# Patient Record
Sex: Female | Born: 1950 | Race: White | Hispanic: No | Marital: Married | Smoking: Former smoker
Health system: Southern US, Community
[De-identification: ages and names within clinical notes are randomized; demographics above are authoritative.]

## PROBLEM LIST (undated history)

## (undated) DIAGNOSIS — D320 Benign neoplasm of cerebral meninges: Secondary | ICD-10-CM

## (undated) DIAGNOSIS — J449 Chronic obstructive pulmonary disease, unspecified: Secondary | ICD-10-CM

## (undated) DIAGNOSIS — E7800 Pure hypercholesterolemia, unspecified: Secondary | ICD-10-CM

## (undated) DIAGNOSIS — E039 Hypothyroidism, unspecified: Secondary | ICD-10-CM

## (undated) DIAGNOSIS — I253 Aneurysm of heart: Secondary | ICD-10-CM

## (undated) DIAGNOSIS — I10 Essential (primary) hypertension: Secondary | ICD-10-CM

## (undated) DIAGNOSIS — I2510 Atherosclerotic heart disease of native coronary artery without angina pectoris: Secondary | ICD-10-CM

## (undated) DIAGNOSIS — E042 Nontoxic multinodular goiter: Secondary | ICD-10-CM

## (undated) DIAGNOSIS — M810 Age-related osteoporosis without current pathological fracture: Secondary | ICD-10-CM

## (undated) DIAGNOSIS — M1990 Unspecified osteoarthritis, unspecified site: Secondary | ICD-10-CM

## (undated) HISTORY — PX: TONSILLECTOMY: SUR1361

## (undated) HISTORY — DX: Unspecified osteoarthritis, unspecified site: M19.90

## (undated) HISTORY — DX: Hypothyroidism, unspecified: E03.9

## (undated) HISTORY — DX: Chronic obstructive pulmonary disease, unspecified: J44.9

## (undated) HISTORY — DX: Age-related osteoporosis without current pathological fracture: M81.0

## (undated) SURGERY — FIXATION, FEMUR, NECK, PERCUTANEOUS, USING SCREW
Anesthesia: Choice | Laterality: Right

---

## 1998-12-23 ENCOUNTER — Emergency Department (HOSPITAL_COMMUNITY): Admission: EM | Admit: 1998-12-23 | Discharge: 1998-12-23 | Payer: Self-pay | Admitting: Emergency Medicine

## 1998-12-24 ENCOUNTER — Encounter: Payer: Self-pay | Admitting: Emergency Medicine

## 1999-08-30 ENCOUNTER — Other Ambulatory Visit: Admission: RE | Admit: 1999-08-30 | Discharge: 1999-08-30 | Payer: Self-pay | Admitting: Obstetrics and Gynecology

## 1999-11-23 ENCOUNTER — Encounter: Payer: Self-pay | Admitting: Obstetrics and Gynecology

## 1999-11-23 ENCOUNTER — Ambulatory Visit (HOSPITAL_COMMUNITY): Admission: RE | Admit: 1999-11-23 | Discharge: 1999-11-23 | Payer: Self-pay | Admitting: Obstetrics and Gynecology

## 2000-09-12 ENCOUNTER — Other Ambulatory Visit: Admission: RE | Admit: 2000-09-12 | Discharge: 2000-09-12 | Payer: Self-pay | Admitting: Obstetrics and Gynecology

## 2000-12-18 ENCOUNTER — Encounter: Payer: Self-pay | Admitting: Obstetrics and Gynecology

## 2000-12-18 ENCOUNTER — Ambulatory Visit (HOSPITAL_COMMUNITY): Admission: RE | Admit: 2000-12-18 | Discharge: 2000-12-18 | Payer: Self-pay | Admitting: Obstetrics and Gynecology

## 2002-01-26 ENCOUNTER — Encounter: Payer: Self-pay | Admitting: *Deleted

## 2002-01-26 ENCOUNTER — Emergency Department (HOSPITAL_COMMUNITY): Admission: EM | Admit: 2002-01-26 | Discharge: 2002-01-26 | Payer: Self-pay | Admitting: *Deleted

## 2002-07-25 ENCOUNTER — Inpatient Hospital Stay (HOSPITAL_COMMUNITY): Admission: AD | Admit: 2002-07-25 | Discharge: 2002-07-25 | Payer: Self-pay | Admitting: Obstetrics and Gynecology

## 2003-08-16 ENCOUNTER — Emergency Department (HOSPITAL_COMMUNITY): Admission: EM | Admit: 2003-08-16 | Discharge: 2003-08-16 | Payer: Self-pay | Admitting: Emergency Medicine

## 2004-03-15 ENCOUNTER — Other Ambulatory Visit: Admission: RE | Admit: 2004-03-15 | Discharge: 2004-03-15 | Payer: Self-pay | Admitting: Obstetrics and Gynecology

## 2004-06-15 ENCOUNTER — Ambulatory Visit: Payer: Self-pay | Admitting: Cardiology

## 2005-07-11 ENCOUNTER — Other Ambulatory Visit: Admission: RE | Admit: 2005-07-11 | Discharge: 2005-07-11 | Payer: Self-pay | Admitting: Obstetrics and Gynecology

## 2009-05-03 ENCOUNTER — Ambulatory Visit (HOSPITAL_COMMUNITY): Admission: RE | Admit: 2009-05-03 | Discharge: 2009-05-03 | Payer: Self-pay | Admitting: Orthopedic Surgery

## 2009-06-09 ENCOUNTER — Encounter (HOSPITAL_COMMUNITY): Admission: RE | Admit: 2009-06-09 | Discharge: 2009-07-09 | Payer: Self-pay | Admitting: Internal Medicine

## 2009-07-07 ENCOUNTER — Other Ambulatory Visit: Admission: RE | Admit: 2009-07-07 | Discharge: 2009-07-07 | Payer: Self-pay | Admitting: Interventional Radiology

## 2009-07-07 ENCOUNTER — Encounter: Admission: RE | Admit: 2009-07-07 | Discharge: 2009-07-07 | Payer: Self-pay | Admitting: Endocrinology

## 2010-03-22 ENCOUNTER — Encounter: Admission: RE | Admit: 2010-03-22 | Discharge: 2010-03-22 | Payer: Self-pay | Admitting: Endocrinology

## 2010-05-10 ENCOUNTER — Encounter: Payer: Self-pay | Admitting: Cardiology

## 2010-05-31 ENCOUNTER — Encounter: Payer: Self-pay | Admitting: Cardiology

## 2010-06-02 ENCOUNTER — Ambulatory Visit: Payer: Self-pay | Admitting: Cardiology

## 2010-06-21 DIAGNOSIS — E785 Hyperlipidemia, unspecified: Secondary | ICD-10-CM | POA: Insufficient documentation

## 2010-06-21 DIAGNOSIS — R0789 Other chest pain: Secondary | ICD-10-CM | POA: Insufficient documentation

## 2010-06-21 DIAGNOSIS — E042 Nontoxic multinodular goiter: Secondary | ICD-10-CM | POA: Insufficient documentation

## 2010-06-21 DIAGNOSIS — R002 Palpitations: Secondary | ICD-10-CM | POA: Insufficient documentation

## 2010-07-03 ENCOUNTER — Encounter: Payer: Self-pay | Admitting: Endocrinology

## 2010-07-03 ENCOUNTER — Encounter: Payer: Self-pay | Admitting: Orthopedic Surgery

## 2010-07-05 ENCOUNTER — Encounter: Payer: Self-pay | Admitting: Cardiology

## 2010-07-05 DIAGNOSIS — I10 Essential (primary) hypertension: Secondary | ICD-10-CM | POA: Insufficient documentation

## 2010-07-05 DIAGNOSIS — F17200 Nicotine dependence, unspecified, uncomplicated: Secondary | ICD-10-CM | POA: Insufficient documentation

## 2010-07-14 NOTE — Assessment & Plan Note (Signed)
Summary: ec6/ irregular heart beat. pt has uch./gd   Visit Type:  Initial Consult Primary Provider:  Dr. Phillips Odor  CC:  pt has no complaints today.  History of Present Illness: Penny Hicks comes in today for evaluation of an irregular heartbeat.  She's referred by Dr. Vincente Poli.   Looking at the notes her heart rate was not recorded in her cardiac exam revealed a regular rate and rhythm. There was no EKG. She did have a son who recently died of sudden cardiac death from an enlarged heart and that may precipitated his visit as well.  She denies new palpitations, angina, or chest discomfort. 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  She has a history of hypertension. She eats a lot of sodium at work. She is on lisinopril HCTZ started by her primary care Dr. Phillips Odor.  She smokes and would like to stop. Because of increased stress of losing her son she been smoking more than usual. She does not exercise on a regular basis.  Recent blood work in November showed a normal thyroid panel, total cholesterol 213, HDL 57, total cholesterol ratio ratio 3.7, LDL 1:30. Her hemoglobin A1c was 5.9%.  Her EKG today shows normal sinus rhythm in the 90s.    Current Medications (verified): 1)  Ambien 10 Mg Tabs (Zolpidem Tartrate) .Marland Kitchen.. 1 Tab At Bedtime 2)  Tramadol Hcl 50 Mg Tabs (Tramadol Hcl) .Marland Kitchen.. 1 Tab Two Times A Day 3)  Lisinopril-Hydrochlorothiazide 10-12.5 Mg Tabs (Lisinopril-Hydrochlorothiazide) .Marland Kitchen.. 1 Tab Once Daily 4)  Synthroid 75 Mcg Tabs (Levothyroxine Sodium) .Marland Kitchen.. 1 Tab Once Daily 5)  Vagifem 10 Mcg Tabs (Estradiol) .... 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 Neck:  Neck supple, no JVD. No masses, thyromegaly or abnormal cervical nodes. Chest Darcus Edds:  no deformities or breast masses noted Lungs:  Clear bilaterally to auscultation and percussion. Heart:  PMI nondisplaced, normal S1-S2, no click or murmur. Regular rate and rhythm, no carotid bruits Abdomen:  soft, good bowel sounds, no bruits Msk:  Back normal, normal gait. Muscle strength and tone normal. Pulses:  pulses normal in all 4 extremities Extremities:  No clubbing or cyanosis. Neurologic:  Alert and oriented x 3. Skin:  Intact without lesions or rashes. Psych:  anxious and hyperactive.     Problems:  Medical Problems Added: 1)  Dx of Tobacco Abuse  (ICD-305.1) 2)  Dx of Hypertension, Unspecified  (ICD-401.9)  Impression & Recommendations:  Problem # 1:  HYPERTENSION, UNSPECIFIED (ICD-401.9) Assessment Deteriorated  I suspect this is due to 2 a high sodium diet, stress, smoking, and genetic. Have increased her lisinopril to 20 mg and her HCTZ at  12.5. We'll have her return in 2 weeks for blood pressure check and metabolic profile. We have given her information for no smoking clinic. In addition we have talked about salt restriction and beginning to walk on a regular basis. Her updated medication list for this problem includes:    Lisinopril-hydrochlorothiazide 20-12.5 Mg Tabs (Lisinopril-hydrochlorothiazide) .Marland Kitchen... Take 1 tablet daily  Orders: Misc. Referral (Misc. Ref)  Problem # 2:  HYPERLIPIDEMIA (ICD-272.4) if That she continues to  smoke, I would probably put her on a statin low-dose to lower her LDL below 100. She is blessed with a relatively good HDL level. I will leave this to her primary care.  Problem # 3:  TOBACCO ABUSE (ICD-305.1)  she That will also tender no smoking clinic. Information given.  Orders: Misc. Referral (Misc. Ref)  Other Orders: EKG w/ Interpretation (93000)  Patient Instructions: 1)  Your physician recommends that you return for lab work 07/19/10 for a bmet and a nurse room visit for a blood pressure check at:  9:00am 2)  Your physician has recommended you make the following change in your medication:  3)  Your physician has requested that you limit the intake of sodium (salt) in your diet to two grams daily. Please see [facility] handout. 4)  Your physician discussed the hazards of tobacco use.  Tobacco use cessation is recommended and techniques and options to help you quit were discussed. Prescriptions: LISINOPRIL-HYDROCHLOROTHIAZIDE 20-12.5 MG TABS (LISINOPRIL-HYDROCHLOROTHIAZIDE) Take 1 tablet daily  #30 x 11   Entered by:   Lisabeth Devoid RN   Authorized by:   Gaylord Shih, MD, [facility]   Signed by:   Lisabeth Devoid RN on 07/05/2010   Method used:   Electronically to        [facility]  [address] [address] (retail)       [address]       [place], [place]  [address]       Ph: [phone] or [phone]       Fax: [phone]   RxID:   [id number]

## 2010-07-19 ENCOUNTER — Encounter (INDEPENDENT_AMBULATORY_CARE_PROVIDER_SITE_OTHER): Payer: 59

## 2010-07-19 ENCOUNTER — Encounter: Payer: Self-pay | Admitting: Cardiovascular Disease

## 2010-07-19 ENCOUNTER — Other Ambulatory Visit (INDEPENDENT_AMBULATORY_CARE_PROVIDER_SITE_OTHER): Payer: 59

## 2010-07-19 ENCOUNTER — Other Ambulatory Visit: Payer: Self-pay | Admitting: Cardiovascular Disease

## 2010-07-19 ENCOUNTER — Encounter (INDEPENDENT_AMBULATORY_CARE_PROVIDER_SITE_OTHER): Payer: Self-pay | Admitting: *Deleted

## 2010-07-19 DIAGNOSIS — I10 Essential (primary) hypertension: Secondary | ICD-10-CM

## 2010-07-19 LAB — BASIC METABOLIC PANEL
Chloride: 101 mEq/L (ref 96–112)
Creatinine, Ser: 0.8 mg/dL (ref 0.4–1.2)
GFR: 76.38 mL/min (ref >60.00)
Potassium: 4.2 mEq/L (ref 3.5–5.1)
Sodium: 139 mEq/L (ref 135–145)

## 2010-07-28 NOTE — Assessment & Plan Note (Signed)
  Nurse Visit   Vital Signs:  Patient profile:   60 year old female Pulse rate:   64 / minute BP supine:   120 / 80  (right arm) BP sitting:   120 / 80  (left arm) Cuff size:   regular  Primary Provider:  Dr. Phillips Odor   History of Present Illness: Patient here for BP check in follow up to the increase dose of Lisinopril 20/Hctz12.5 on 07/05/2010. She states that she is tolerating the medication without any problems. Bmet done today. Advised patient that she will be called with lab work results when available after MD review.    Allergies: No Known Drug Allergies

## 2010-08-23 NOTE — Letter (Signed)
Summary: [facility] Office Visit Note   [facility] Office Visit Note   Imported By: Roderic Ovens 08/18/2010 15:36:06  _____________________________________________________________________  External Attachment:    Type:   Image     Comment:   External Document

## 2010-10-25 ENCOUNTER — Other Ambulatory Visit: Payer: Self-pay | Admitting: Endocrinology

## 2010-10-25 DIAGNOSIS — E041 Nontoxic single thyroid nodule: Secondary | ICD-10-CM

## 2010-10-28 NOTE — Procedures (Signed)
   NAME:  Penny Hicks, Penny Hicks                       ACCOUNT NO.:  [id number]   MEDICAL RECORD NO.:  [id number]                   PATIENT TYPE:  EMS   LOCATION:  ED                                   FACILITY:  [facility]   PHYSICIAN:  Fredirick Maudlin, M.D.              DATE OF BIRTH:  Nov 03, 1950   DATE OF PROCEDURE:  01/26/2002  DATE OF DISCHARGE:                                EKG INTERPRETATION   The rhythm was a sinus rhythm with a rate in the 80s.  There are small T  waves inferiorly.  These were also seen in the lateral chest leads.  Otherwise normal EKG.                                               Fredirick Maudlin, M.D.    [facility]/MEDQ  D:  01/27/2002  T:  01/28/2002  Job:  [id number]

## 2011-03-27 ENCOUNTER — Other Ambulatory Visit: Payer: 59

## 2011-04-17 ENCOUNTER — Other Ambulatory Visit: Payer: 59

## 2011-04-19 ENCOUNTER — Ambulatory Visit
Admission: RE | Admit: 2011-04-19 | Discharge: 2011-04-19 | Disposition: A | Discharge status: Home / Self Care | Payer: 59 | Source: Ambulatory Visit | Attending: Endocrinology | Admitting: Endocrinology

## 2011-04-19 DIAGNOSIS — E041 Nontoxic single thyroid nodule: Secondary | ICD-10-CM

## 2011-06-21 ENCOUNTER — Other Ambulatory Visit: Payer: Self-pay | Admitting: *Deleted

## 2011-06-21 MED ORDER — LISINOPRIL-HYDROCHLOROTHIAZIDE 20-12.5 MG PO TABS: 1.0000 | ORAL_TABLET | Freq: Every day | ORAL | Status: DC

## 2011-11-01 ENCOUNTER — Other Ambulatory Visit: Payer: Self-pay | Admitting: Obstetrics and Gynecology

## 2012-01-02 ENCOUNTER — Other Ambulatory Visit: Payer: Self-pay | Admitting: Cardiology

## 2012-01-03 ENCOUNTER — Telehealth: Payer: Self-pay | Admitting: *Deleted

## 2012-01-03 NOTE — Telephone Encounter (Signed)
Daughter returned call. Pt still asleep. Aware refill sent in. Pt will call back to schedule yearly appt with Dr. Daleen Squibb. Mylo Red RN

## 2012-01-03 NOTE — Telephone Encounter (Signed)
LMTCB. Pt overdue for yearly appointment. Refilled lisinopril-hctz. Mylo Red RN

## 2012-02-10 IMAGING — US US SOFT TISSUE HEAD/NECK
1 series · 14 of 25 positions shown · non-contrast
Comparison: 03/22/2010

CLINICAL DATA: Thyroid nodule, prior benign biopsy

THYROID ULTRASOUND
TECHNIQUE: Ultrasound examination of the thyroid gland and adjacent
soft tissues was performed.

[Series 1: us soft tissue head/neck · 0.08mm/px · 14 of 90 slices shown]
[im 1/90]
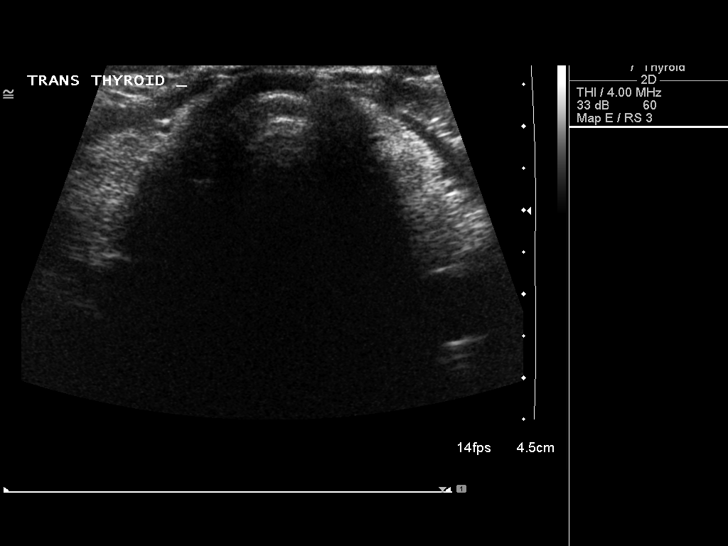
[im 8/90]
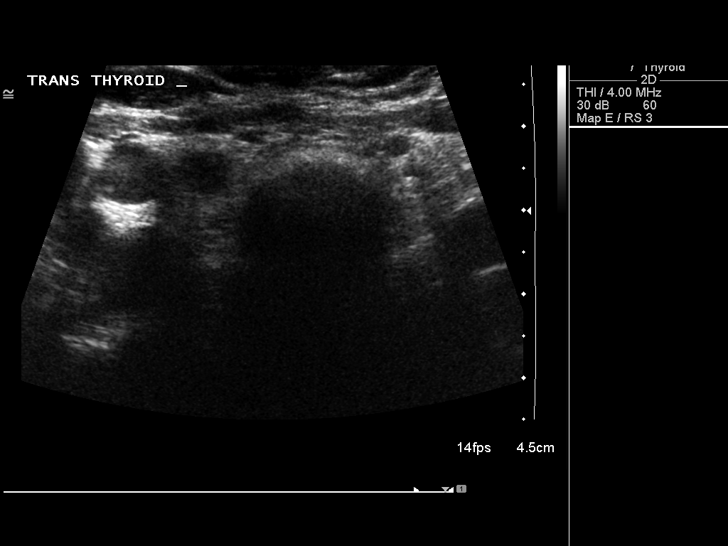
[im 15/90]
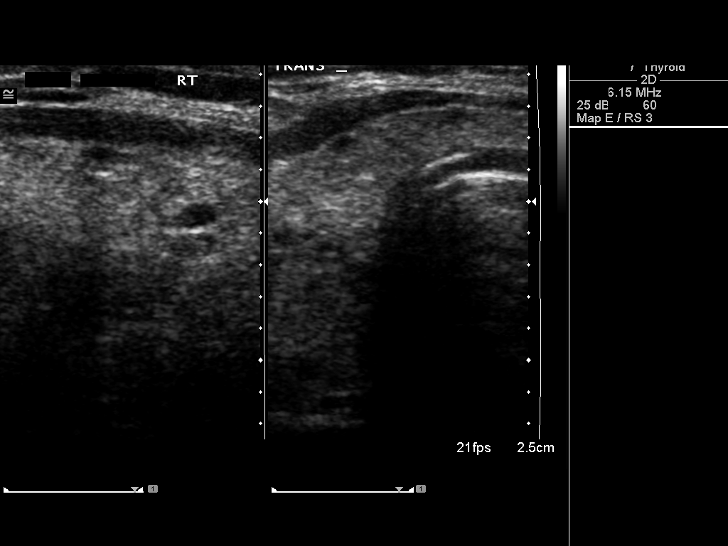
[im 23/90]
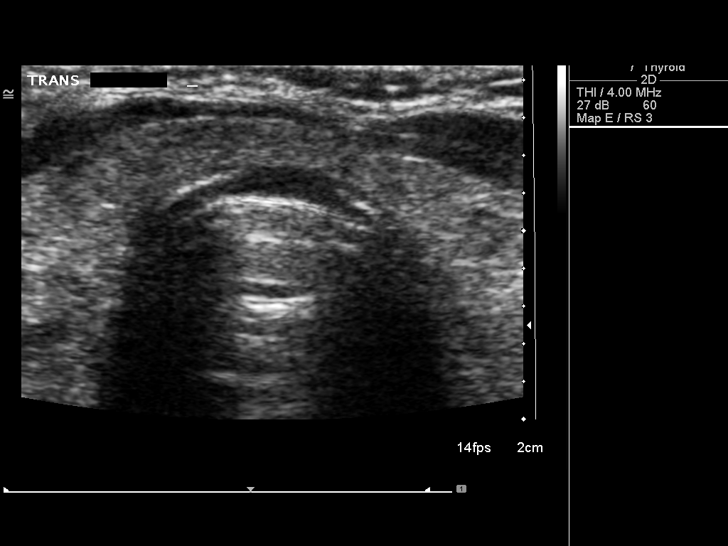
[im 30/90]
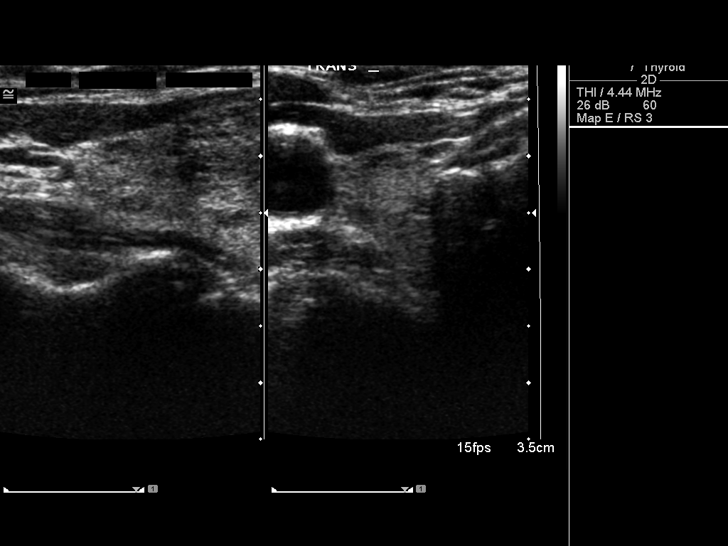
[im 34/90]
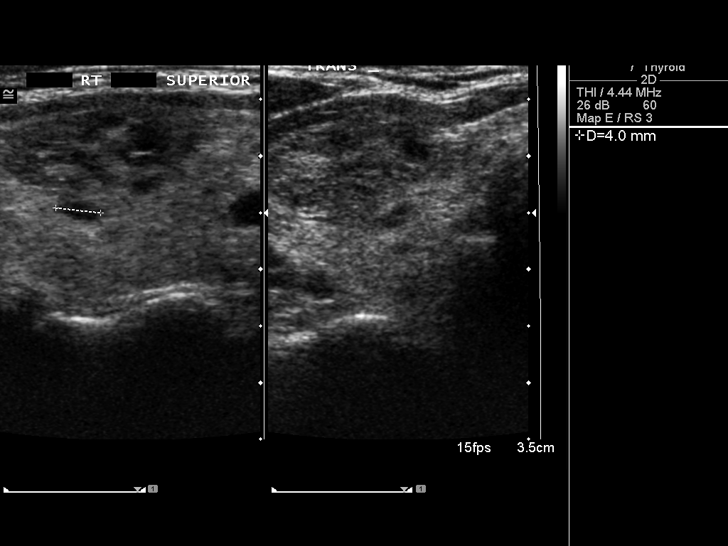
[im 41/90]
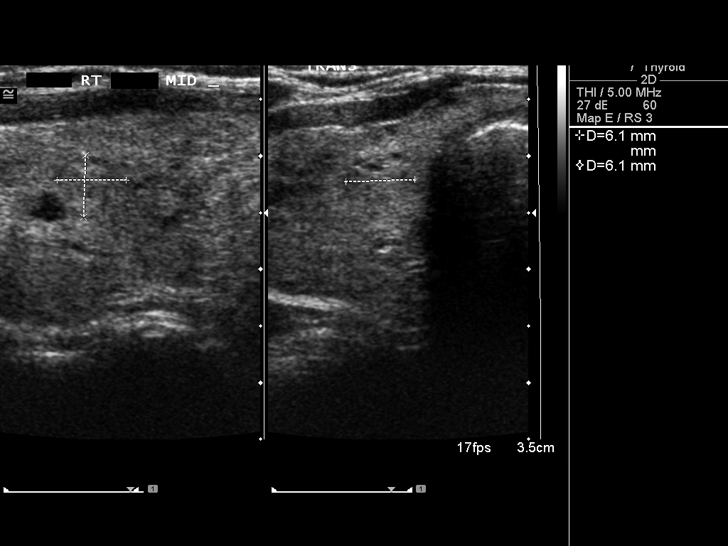
[im 49/90]
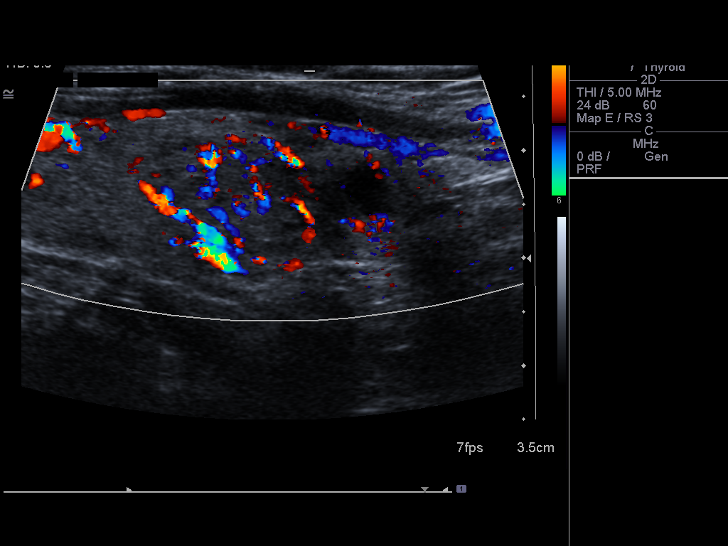
[im 56/90]
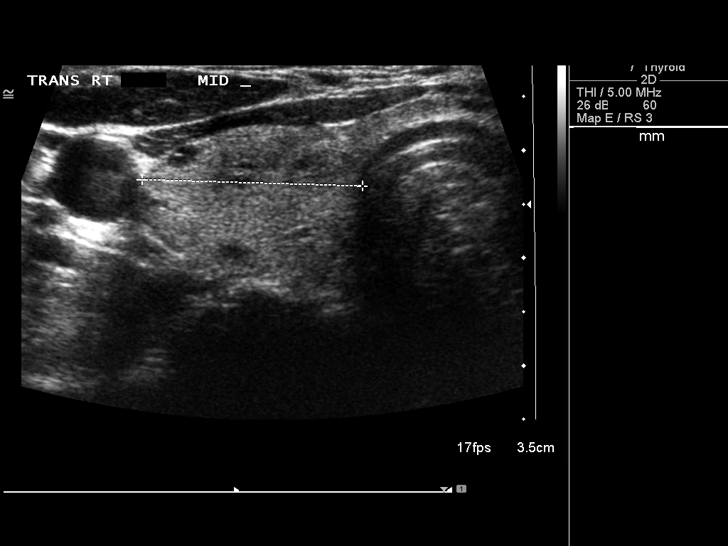
[im 60/90]
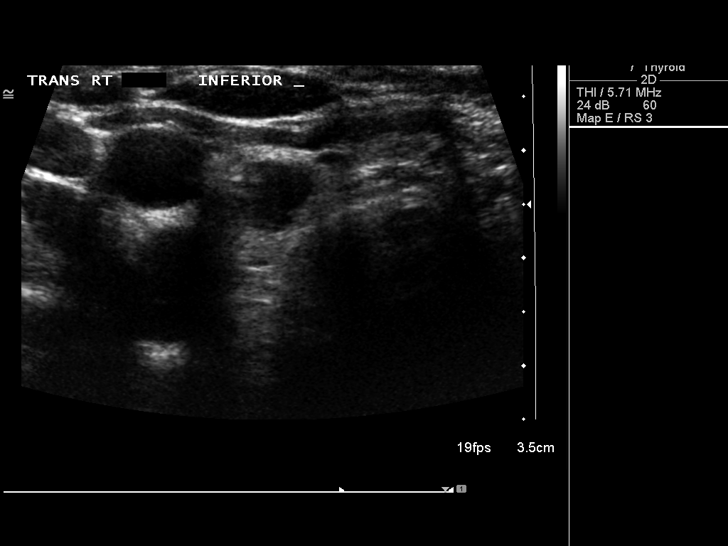
[im 67/90]
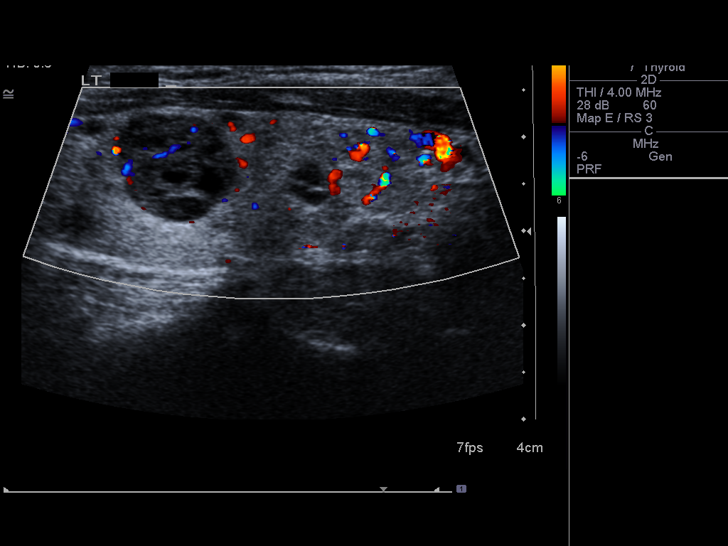
[im 75/90]
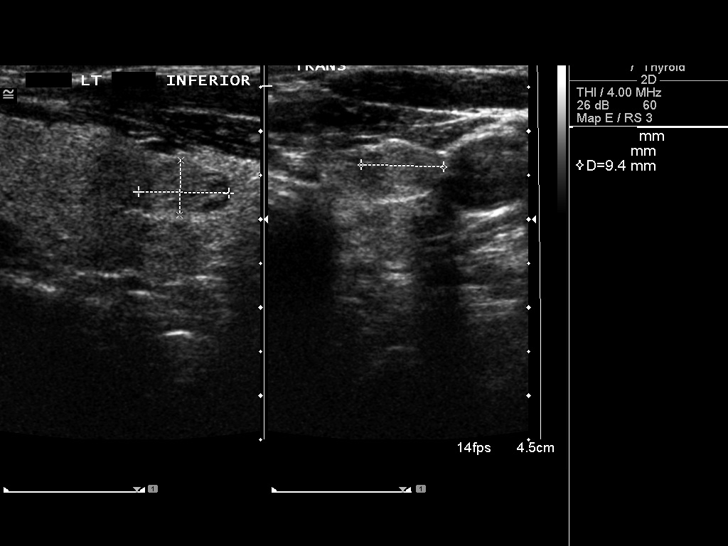
[im 82/90]
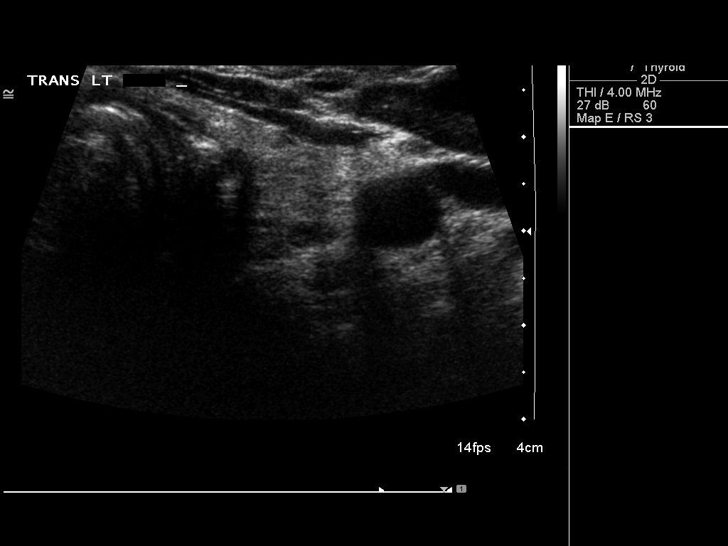
[im 90/90]
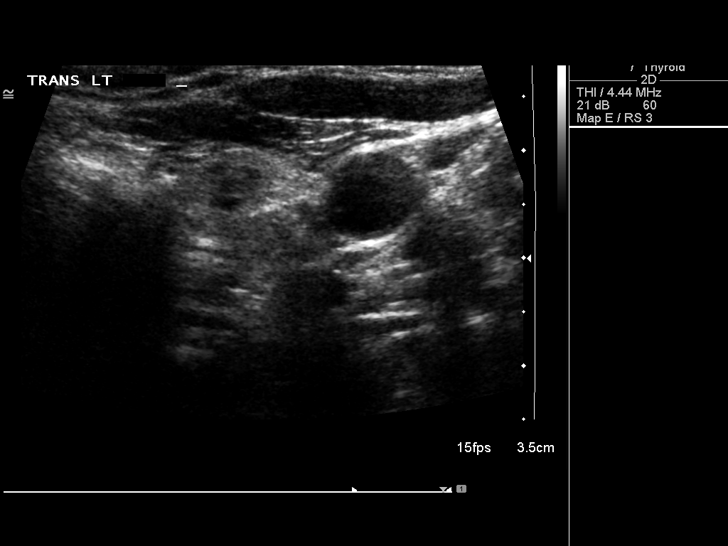

[14 of 25 positions shown; findings below may reference images not displayed]

FINDINGS: Right thyroid lobe:  Measures 5.6 x 1.7 x 2.1 cm.
Left thyroid lobe:  Measures 5.8 x 2.0 x 2.0 cm.
Isthmus:  Measures 2 mm in thickness.

Focal nodules:  Numerous bilateral thyroid nodules.  Dominant
nodules include:
--1.8 x 1.1 x 1.6 cm spongiform nodule or in the right upper gland
--2.0 x 1.3 x 1.3 cm predominantly solid nodule in the right lower
gland
--1.3 x 1.2 x 1.3 cm spongiform nodule in the left upper gland
--1.6 x 1.2 x 1.3 cm predominantly solid nodule in the left lower
gland

Overall, this appearance is grossly unchanged from the prior study.

Lymphadenopathy:  None visualized.
IMPRESSION: Numerous bilateral thyroid nodules, compatible with multinodular
goiter.  Dominant nodules as above, grossly unchanged.

## 2012-03-22 ENCOUNTER — Encounter: Payer: Self-pay | Admitting: Gastroenterology

## 2012-07-04 ENCOUNTER — Other Ambulatory Visit: Payer: Self-pay | Admitting: Cardiology

## 2012-07-05 MED ORDER — LISINOPRIL-HYDROCHLOROTHIAZIDE 20-12.5 MG PO TABS: 1.0000 | ORAL_TABLET | Freq: Every day | ORAL | Status: DC

## 2012-07-05 NOTE — Addendum Note (Signed)
Addended by: Reine Just on: 07/05/2012 09:23 AM   Modules accepted: Orders

## 2012-07-30 ENCOUNTER — Other Ambulatory Visit: Payer: Self-pay | Admitting: *Deleted

## 2012-07-30 MED ORDER — LISINOPRIL-HYDROCHLOROTHIAZIDE 20-12.5 MG PO TABS: 1.0000 | ORAL_TABLET | Freq: Every day | ORAL | Status: DC

## 2013-05-13 ENCOUNTER — Other Ambulatory Visit: Payer: Self-pay | Admitting: Obstetrics and Gynecology

## 2015-02-23 ENCOUNTER — Other Ambulatory Visit: Payer: Self-pay | Admitting: Obstetrics and Gynecology

## 2015-02-24 LAB — CYTOLOGY - PAP

## 2015-05-13 DIAGNOSIS — I5022 Chronic systolic (congestive) heart failure: Secondary | ICD-10-CM

## 2015-05-13 HISTORY — DX: Chronic systolic (congestive) heart failure: I50.22

## 2015-05-24 ENCOUNTER — Emergency Department (HOSPITAL_COMMUNITY): Payer: 59

## 2015-05-24 ENCOUNTER — Inpatient Hospital Stay (HOSPITAL_COMMUNITY)
Admission: EM | Admit: 2015-05-24 | Discharge: 2015-05-28 | DRG: 481 | Disposition: A | Discharge status: Home Health | Payer: 59 | Attending: Internal Medicine | Admitting: Internal Medicine

## 2015-05-24 ENCOUNTER — Encounter (HOSPITAL_COMMUNITY): Payer: Self-pay | Admitting: *Deleted

## 2015-05-24 DIAGNOSIS — I501 Left ventricular failure: Secondary | ICD-10-CM | POA: Diagnosis not present

## 2015-05-24 DIAGNOSIS — E7800 Pure hypercholesterolemia, unspecified: Secondary | ICD-10-CM | POA: Diagnosis not present

## 2015-05-24 DIAGNOSIS — S72001A Fracture of unspecified part of neck of right femur, initial encounter for closed fracture: Secondary | ICD-10-CM | POA: Diagnosis present

## 2015-05-24 DIAGNOSIS — S72001F Fracture of unspecified part of neck of right femur, subsequent encounter for open fracture type IIIA, IIIB, or IIIC with routine healing: Secondary | ICD-10-CM | POA: Diagnosis not present

## 2015-05-24 DIAGNOSIS — F17200 Nicotine dependence, unspecified, uncomplicated: Secondary | ICD-10-CM | POA: Diagnosis not present

## 2015-05-24 DIAGNOSIS — R002 Palpitations: Secondary | ICD-10-CM | POA: Diagnosis present

## 2015-05-24 DIAGNOSIS — W1789XA Other fall from one level to another, initial encounter: Secondary | ICD-10-CM | POA: Diagnosis present

## 2015-05-24 DIAGNOSIS — E785 Hyperlipidemia, unspecified: Secondary | ICD-10-CM | POA: Diagnosis present

## 2015-05-24 DIAGNOSIS — E876 Hypokalemia: Secondary | ICD-10-CM | POA: Diagnosis present

## 2015-05-24 DIAGNOSIS — I10 Essential (primary) hypertension: Secondary | ICD-10-CM | POA: Diagnosis present

## 2015-05-24 DIAGNOSIS — S72011A Unspecified intracapsular fracture of right femur, initial encounter for closed fracture: Secondary | ICD-10-CM | POA: Diagnosis present

## 2015-05-24 DIAGNOSIS — S72009A Fracture of unspecified part of neck of unspecified femur, initial encounter for closed fracture: Secondary | ICD-10-CM | POA: Diagnosis present

## 2015-05-24 DIAGNOSIS — Z79899 Other long term (current) drug therapy: Secondary | ICD-10-CM | POA: Diagnosis not present

## 2015-05-24 DIAGNOSIS — R062 Wheezing: Secondary | ICD-10-CM | POA: Diagnosis not present

## 2015-05-24 DIAGNOSIS — R0600 Dyspnea, unspecified: Secondary | ICD-10-CM | POA: Diagnosis not present

## 2015-05-24 DIAGNOSIS — E039 Hypothyroidism, unspecified: Secondary | ICD-10-CM | POA: Diagnosis present

## 2015-05-24 DIAGNOSIS — Y929 Unspecified place or not applicable: Secondary | ICD-10-CM

## 2015-05-24 DIAGNOSIS — D72829 Elevated white blood cell count, unspecified: Secondary | ICD-10-CM | POA: Diagnosis not present

## 2015-05-24 DIAGNOSIS — I509 Heart failure, unspecified: Secondary | ICD-10-CM | POA: Insufficient documentation

## 2015-05-24 DIAGNOSIS — I5022 Chronic systolic (congestive) heart failure: Secondary | ICD-10-CM | POA: Diagnosis present

## 2015-05-24 DIAGNOSIS — R05 Cough: Secondary | ICD-10-CM | POA: Diagnosis present

## 2015-05-24 DIAGNOSIS — F17210 Nicotine dependence, cigarettes, uncomplicated: Secondary | ICD-10-CM | POA: Diagnosis not present

## 2015-05-24 DIAGNOSIS — W19XXXA Unspecified fall, initial encounter: Secondary | ICD-10-CM

## 2015-05-24 DIAGNOSIS — Z419 Encounter for procedure for purposes other than remedying health state, unspecified: Secondary | ICD-10-CM

## 2015-05-24 DIAGNOSIS — S72001D Fracture of unspecified part of neck of right femur, subsequent encounter for closed fracture with routine healing: Secondary | ICD-10-CM | POA: Diagnosis not present

## 2015-05-24 HISTORY — DX: Essential (primary) hypertension: I10

## 2015-05-24 HISTORY — DX: Pure hypercholesterolemia, unspecified: E78.00

## 2015-05-24 LAB — CBC WITH DIFFERENTIAL/PLATELET
BASOS ABS: 0 10*3/uL (ref 0.0–0.1)
BASOS PCT: 0 %
EOS PCT: 0 %
Eosinophils Absolute: 0 10*3/uL (ref 0.0–0.7)
HEMATOCRIT: 44.5 % (ref 36.0–46.0)
Hemoglobin: 15.2 g/dL — ABNORMAL HIGH (ref 12.0–15.0)
LYMPHS PCT: 6 %
Lymphs Abs: 1 10*3/uL (ref 0.7–4.0)
MCH: 31.9 pg (ref 26.0–34.0)
MCHC: 34.2 g/dL (ref 30.0–36.0)
MCV: 93.5 fL (ref 78.0–100.0)
MONO ABS: 0.8 10*3/uL (ref 0.1–1.0)
Monocytes Relative: 5 %
NEUTROS ABS: 14.2 10*3/uL — AB (ref 1.7–7.7)
Neutrophils Relative %: 89 %
PLATELETS: 246 10*3/uL (ref 150–400)
RBC: 4.76 MIL/uL (ref 3.87–5.11)
RDW: 12.7 % (ref 11.5–15.5)
WBC: 16 10*3/uL — AB (ref 4.0–10.5)

## 2015-05-24 LAB — TYPE AND SCREEN
ABO/RH(D): A POS
ANTIBODY SCREEN: NEGATIVE

## 2015-05-24 LAB — BASIC METABOLIC PANEL
ANION GAP: 7 (ref 5–15)
BUN: 15 mg/dL (ref 6–20)
CALCIUM: 9 mg/dL (ref 8.9–10.3)
CO2: 27 mmol/L (ref 22–32)
Chloride: 105 mmol/L (ref 101–111)
Creatinine, Ser: 0.75 mg/dL (ref 0.44–1.00)
Glucose, Bld: 139 mg/dL — ABNORMAL HIGH (ref 65–99)
POTASSIUM: 3.8 mmol/L (ref 3.5–5.1)
Sodium: 139 mmol/L (ref 135–145)

## 2015-05-24 LAB — PROTIME-INR
INR: 1.07 (ref 0.00–1.49)
PROTHROMBIN TIME: 14.1 s (ref 11.6–15.2)

## 2015-05-24 MED ORDER — ONDANSETRON HCL 4 MG/2ML IJ SOLN
4.0000 mg | Freq: Once | INTRAMUSCULAR | Status: AC
  Administered 2015-05-24: 4 mg via INTRAVENOUS
  Filled 2015-05-24: qty 2

## 2015-05-24 MED ORDER — FENTANYL CITRATE (PF) 100 MCG/2ML IJ SOLN
50.0000 ug | INTRAMUSCULAR | Status: DC | PRN
  Administered 2015-05-24 – 2015-05-25 (×2): 50 ug via INTRAVENOUS
  Filled 2015-05-24 (×2): qty 2

## 2015-05-24 NOTE — ED Notes (Signed)
[facility] gave pt a total of morphine 4mg  IV en route to hospital pt's pain came down from 9/10 to a 6/10

## 2015-05-24 NOTE — Progress Notes (Signed)
I have reviewed this patient's x-rays and spoken with the emergency room physician at [facility].this patient has a impacted femoral neck fracture.  She will be amenable to percutaneous screw fixation.  I plan on doing this Tuesday around noon.  She will be sent down and admitted to the hospitalist service. She should be n.p.o. After midnight.  I will evaluate her more thoroughly and provided full consult note in the morning.

## 2015-05-24 NOTE — H&P (Signed)
[facility] History and Physical  Penny Hicks [id number] DOB: 1950-08-20    PCP:   [name], MD   Chief Complaint: mechanical fall with right hip Fx.   HPI: Penny Hicks is an 64 y.o. female with hx of HTN, HLD, tobacco abuse, hx of palpitation, multinodular goiter, tripped and fell tonight.  No LOC.  She was evaluated in the ER with hip Xray which showed right comminuted subcapital to mid femoral neck Fx.  She also was found to have leukocytosis and Hb of 14 g per dL.  No evidence of infection clinically, and her CXR was clear.  Her UA is pending.  She has no chest pain, SOB, fever, chills, N/V/D.  Her GU./GI ROS is negative.  She doesn't drink alcohol.  EDP consulted Dr Erlinda Hong of orthopedics, and plan was to take her to surgery tomorrow.  Hospitalist was asked to admit her.   Rewiew of Systems:  Constitutional: Negative for malaise, fever and chills. No significant weight loss or weight gain Eyes: Negative for eye pain, redness and discharge, diplopia, visual changes, or flashes of light. ENMT: Negative for ear pain, hoarseness, nasal congestion, sinus pressure and sore throat. No headaches; tinnitus, drooling, or problem swallowing. Cardiovascular: Negative for chest pain, palpitations, diaphoresis, dyspnea and peripheral edema. ; No orthopnea, PND Respiratory: Negative for cough, hemoptysis, wheezing and stridor. No pleuritic chestpain. Gastrointestinal: Negative for nausea, vomiting, diarrhea, constipation, abdominal pain, melena, blood in stool, hematemesis, jaundice and rectal bleeding.    Genitourinary: Negative for frequency, dysuria, incontinence,flank pain and hematuria; Musculoskeletal: Negative for back pain and neck pain. Negative for swelling and trauma.;  Skin: . Negative for pruritus, rash, abrasions, bruising and skin lesion.; ulcerations Neuro: Negative for headache, lightheadedness and neck stiffness. Negative for weakness, altered level of  consciousness , altered mental status, extremity weakness, burning feet, involuntary movement, seizure and syncope.  Psych: negative for anxiety, depression, insomnia, tearfulness, panic attacks, hallucinations, paranoia, suicidal or homicidal ideation    Past Medical History  Diagnosis Date  . Hypertension   . Thyroid disease     hypothyrodism  . Hypercholesterolemia     Past Surgical History  Procedure Laterality Date  . Tonsillectomy    . Cesarean section  x 2    Medications:  HOME MEDS: Prior to Admission medications   Medication Sig Start Date End Date Taking? Authorizing Provider  atorvastatin (LIPITOR) 40 MG tablet Take 40 mg by mouth daily.   Yes Historical Provider, MD  cholecalciferol (VITAMIN D) 1000 UNITS tablet Take 1,000 Units by mouth daily.   Yes Historical Provider, MD  levothyroxine (SYNTHROID, LEVOTHROID) 75 MCG tablet Take 75 mcg by mouth daily before breakfast.   Yes Historical Provider, MD  lisinopril (PRINIVIL,ZESTRIL) 20 MG tablet Take 20 mg by mouth daily.   Yes Historical Provider, MD  Nutritional Supplements (ESTROVEN PO) Take 1 tablet by mouth every evening.   Yes Historical Provider, MD  Omega-3 Fatty Acids (FISH OIL) 1000 MG CAPS Take 1,000 mg by mouth daily.   Yes Historical Provider, MD  Potassium Gluconate 595 MG CAPS Take 1 capsule by mouth daily.   Yes Historical Provider, MD  traMADol (ULTRAM) 50 MG tablet Take 50 mg by mouth 4 (four) times daily as needed for moderate pain or severe pain.   Yes Historical Provider, MD  vitamin E 400 UNIT capsule Take 400 Units by mouth daily.   Yes Historical Provider, MD  zolpidem (AMBIEN) 10 MG tablet Take 10 mg by mouth  at bedtime as needed for sleep.   Yes Historical Provider, MD     Allergies:  No Known Allergies  Social History:   reports that she has been smoking.  She does not have any smokeless tobacco history on file. She reports that she does not drink alcohol or use illicit drugs.  Family  History: History reviewed. No pertinent family history.   Physical Exam: Filed Vitals:   05/24/15 2100 05/24/15 2115 05/24/15 2130 05/24/15 2200  BP: 158/72  146/85 121/80  Pulse: 54 33 58   Temp:      TempSrc:      Resp: 13 16 18 18   Height:      Weight:      SpO2: 96% 90% 89%    Blood pressure 121/80, pulse 58, temperature 98.7 F (37.1 C), temperature source Oral, resp. rate 18, height 5\' 6"  (1.676 m), weight 68.947 kg (152 lb), SpO2 89 %.  GEN:  Pleasant patient lying in the stretcher in no acute distress; cooperative with exam. PSYCH:  alert and oriented x4; does not appear anxious or depressed; affect is appropriate. HEENT: Mucous membranes pink and anicteric; PERRLA; EOM intact; no cervical lymphadenopathy nor thyromegaly or carotid bruit; no JVD; There were no stridor. Neck is very supple. Breasts:: Not examined CHEST WALL: No tenderness CHEST: Normal respiration, clear to auscultation bilaterally.  HEART: Regular rate and rhythm.  There are no murmur, rub, or gallops.   BACK: No kyphosis or scoliosis; no CVA tenderness ABDOMEN: soft and non-tender; no masses, no organomegaly, normal abdominal bowel sounds; no pannus; no intertriginous candida. There is no rebound and no distention. Rectal Exam: Not done EXTREMITIES: No bone or joint deformity; age-appropriate arthropathy of the hands and knees; no edema; no ulcerations.  There is no calf tenderness. Did not examine her right hip.  Genitalia: not examined PULSES: 2+ and symmetric SKIN: Normal hydration no rash or ulceration CNS: Cranial nerves 2-12 grossly intact no focal lateralizing neurologic deficit.  Speech is fluent; uvula elevated with phonation, facial symmetry and tongue midline. DTR are normal bilaterally, cerebella exam is intact, barbinski is negative and strengths are equaled bilaterally.  No sensory loss.   Labs on Admission:  Basic Metabolic Panel:  Recent Labs Lab 05/24/15 2120  NA 139  K 3.8  CL 105   CO2 27  GLUCOSE 139*  BUN 15  CREATININE 0.75  CALCIUM 9.0   CBC:  Recent Labs Lab 05/24/15 2120  WBC 16.0*  NEUTROABS 14.2*  HGB 15.2*  HCT 44.5  MCV 93.5  PLT 246    Radiological Exams on Admission: Dg Chest 1 View  05/24/2015  CLINICAL DATA:  Fall out of car.  Initial encounter. EXAM: CHEST 1 VIEW COMPARISON:  None. FINDINGS: There is no evidence of pulmonary edema, consolidation, pneumothorax, nodule or pleural fluid. The heart is mildly enlarged. Visualized bony thorax is unremarkable. IMPRESSION: No active disease.  Mild cardiomegaly. Electronically Signed   By: Aletta Edouard M.D.   On: 05/24/2015 21:51   Dg Hip Unilat With Pelvis 2-3 Views Right  05/24/2015  CLINICAL DATA:  Initial encounter for Pt brought in by [facility] for c/o fall; pt was trying to get out of a car and her right foot got caught in car and pt fell out of car seat landing on her right hip; pt is c/o increasing pain since it happened; no abduction noted; positive pulses EXAM: DG HIP (WITH OR WITHOUT PELVIS) 2-3V RIGHT COMPARISON:  None. FINDINGS: Femoral heads are  located. Sacroiliac joints are symmetric. Impaction fracture with probable comminution within the subcapital to mid femoral neck. IMPRESSION: Right femoral neck fracture. Electronically Signed   By: Abigail Miyamoto M.D.   On: 05/24/2015 21:54    EKG: Independently reviewed.    Assessment/Plan Present on Admission:  . Hip fracture, right ([place]) . Essential hypertension . TOBACCO ABUSE . Palpitations . Hip fracture (HCC)  PLAN:  Right hip Fx.  Will admit her to [facility] under hospitalist service per hip Fx pathway.  Dr Erlinda Hong is aware.  Will give IVF, IV pain meds.  I suspect her leukocytosis is from tobacco use, as there is no clinical evidence of infection.  Will check UA.  For her hypothyroidism, will continue with synthroid, check TSH.  She is stable, full code, and will be admitted to [facility] service.  Thank you and Good Day.   Other plans as per  orders.  Code Status: FULL Haskel Khan, MD. [facility] Pager [phone] 7pm to 7am.  05/24/2015, 11:16 PM

## 2015-05-24 NOTE — Consult Note (Signed)
Reason for Consult:femoral neck fracture right Referring Physician: hospital was  Penny Hicks is an 64 y.o. female.  HPI: the patient is an otherwise healthy 64 year old female who fell earlier today.  X-rays show an impacted femoral neck fracture.  She cannot be treated at her facility and will be transferred down for treatment of this impacted femoral neck fracture.  She will be admitted to the hospitalist service and we are consult for management of her fracture.  Past Medical History  Diagnosis Date  . Hypertension   . Thyroid disease     hypothyrodism  . Hypercholesterolemia     Past Surgical History  Procedure Laterality Date  . Tonsillectomy    . Cesarean section  x 2    History reviewed. No pertinent family history.  Social History:  reports that she has been smoking.  She does not have any smokeless tobacco history on file. She reports that she does not drink alcohol or use illicit drugs.  Allergies: No Known Allergies  Medications: I have reviewed the patient's current medications.  Results for orders placed or performed during the hospital encounter of 05/24/15 (from the past 48 hour(s))  CBC with Differential/Platelet     Status: Abnormal   Collection Time: 05/24/15  9:20 PM  Result Value Ref Range   WBC 16.0 (H) 4.0 - 10.5 K/uL   RBC 4.76 3.87 - 5.11 MIL/uL   Hemoglobin 15.2 (H) 12.0 - 15.0 g/dL   HCT 44.5 36.0 - 46.0 %   MCV 93.5 78.0 - 100.0 fL   MCH 31.9 26.0 - 34.0 pg   MCHC 34.2 30.0 - 36.0 g/dL   RDW 12.7 11.5 - 15.5 %   Platelets 246 150 - 400 K/uL   Neutrophils Relative % 89 %   Neutro Abs 14.2 (H) 1.7 - 7.7 K/uL   Lymphocytes Relative 6 %   Lymphs Abs 1.0 0.7 - 4.0 K/uL   Monocytes Relative 5 %   Monocytes Absolute 0.8 0.1 - 1.0 K/uL   Eosinophils Relative 0 %   Eosinophils Absolute 0.0 0.0 - 0.7 K/uL   Basophils Relative 0 %   Basophils Absolute 0.0 0.0 - 0.1 K/uL  Basic metabolic panel     Status: Abnormal   Collection Time: 05/24/15   9:20 PM  Result Value Ref Range   Sodium 139 135 - 145 mmol/Hicks   Potassium 3.8 3.5 - 5.1 mmol/Hicks   Chloride 105 101 - 111 mmol/Hicks   CO2 27 22 - 32 mmol/Hicks   Glucose, Bld 139 (H) 65 - 99 mg/dL   BUN 15 6 - 20 mg/dL   Creatinine, Ser 0.75 0.44 - 1.00 mg/dL   Calcium 9.0 8.9 - 10.3 mg/dL   GFR calc non Af Amer >60 >60 mL/min   GFR calc Af Amer >60 >60 mL/min    Comment: (NOTE) The eGFR has been calculated using the CKD EPI equation. This calculation has not been validated in all clinical situations. eGFR's persistently <60 mL/min signify possible Chronic Kidney Disease.    Anion gap 7 5 - 15  Type and screen     Status: None   Collection Time: 05/24/15  9:20 PM  Result Value Ref Range   ABO/RH(D) A POS    Antibody Screen NEG    Sample Expiration 05/27/2015   Protime-INR     Status: None   Collection Time: 05/24/15  9:20 PM  Result Value Ref Range   Prothrombin Time 14.1 11.6 - 15.2 seconds  INR 1.07 0.00 - 1.49    Dg Chest 1 View  05/24/2015  CLINICAL DATA:  Fall out of car.  Initial encounter. EXAM: CHEST 1 VIEW COMPARISON:  None. FINDINGS: There is no evidence of pulmonary edema, consolidation, pneumothorax, nodule or pleural fluid. The heart is mildly enlarged. Visualized bony thorax is unremarkable. IMPRESSION: No active disease.  Mild cardiomegaly. Electronically Signed   By: Aletta Edouard M.D.   On: 05/24/2015 21:51   Dg Hip Unilat With Pelvis 2-3 Views Right  05/24/2015  CLINICAL DATA:  Initial encounter for Pt brought in by [facility] for c/o fall; pt was trying to get out of a car and her right foot got caught in car and pt fell out of car seat landing on her right hip; pt is c/o increasing pain since it happened; no abduction noted; positive pulses EXAM: DG HIP (WITH OR WITHOUT PELVIS) 2-3V RIGHT COMPARISON:  None. FINDINGS: Femoral heads are located. Sacroiliac joints are symmetric. Impaction fracture with probable comminution within the subcapital to mid femoral neck.  IMPRESSION: Right femoral neck fracture. Electronically Signed   By: Abigail Miyamoto M.D.   On: 05/24/2015 21:54    ROS  ROS: I have reviewed the patient's review of systems thoroughly and there are no positive responses as relates to the HPI. Blood pressure 121/80, pulse 58, temperature 98.7 F (37.1 C), temperature source Oral, resp. rate 18, height $RemoveBe'5\' 6"'pYChqyowa$  (1.676 m), weight 68.947 kg (152 lb), SpO2 89 %. Physical Exam Well-developed well-nourished patient in no acute distress. Alert and oriented x3 HEENT:within normal limits Cardiac: Regular rate and rhythm Pulmonary: Lungs clear to auscultation Abdomen: Soft and nontender.  Normal active bowel sounds  Musculoskeletal: (right hip: Pain with range of motion.  Limited internal rotation.  Neurovascularly intact distally.) Assessment/Plan: 64 year old female with an impacted femoral neck fracture on the right side.  She will be transferred down from any pin admitted to the hospitalist service.//I feel that the most appropriate treatment for her will be percutaneous screw fixation as long as the femoral neck fracture remained impacted and does not displace.current plan is for around noon on 12/13.I had a discussion with the emergency room where she left to make sure that they moved her cautiously to avoid any displacement of her fracture. I have had a prolonged discussion with the patient regarding the risk and benefits of the surgical procedure.  The patient understands the risks include but are not limited to bleeding, infection and failure of the surgery to cure the problem and need for further surgery.  The patient understands there is a slight risk of death at the time of surgery.  The patient understands these risks along with the potential benefits and wishes to proceed with surgical intervention.  The patient will be followed in the office in the postoperative period.  Penny Hicks 05/24/2015, 10:22 PM

## 2015-05-24 NOTE — ED Provider Notes (Signed)
CSN: [id number]     Arrival date & time 05/24/15  2005 History   First MD Initiated Contact with Patient 05/24/15 2052     Chief Complaint  Patient presents with  . Fall      HPI  She presents for evaluation of right hip pain. She was getting out of a car. She is a front seat passenger. Her right foot became tangled in her purse. She fell onto her right hip. She complained of severe right hip pain. A friend was able to get her back into the car and home. However she very quickly became too painful and presents here with the assistance of family complaining of severe right hip pain with any movement.   Past Medical History  Diagnosis Date  . Hypertension   . Thyroid disease     hypothyrodism  . Hypercholesterolemia    Past Surgical History  Procedure Laterality Date  . Tonsillectomy    . Cesarean section  x 2   History reviewed. No pertinent family history. Social History  Substance Use Topics  . Smoking status: Current Every Day Smoker -- 1.00 packs/day  . Smokeless tobacco: None  . Alcohol Use: No   OB History    No data available     Review of Systems  Constitutional: Negative for fever, chills, diaphoresis, appetite change and fatigue.  HENT: Negative for mouth sores, sore throat and trouble swallowing.   Eyes: Negative for visual disturbance.  Respiratory: Negative for cough, chest tightness, shortness of breath and wheezing.   Cardiovascular: Negative for chest pain.  Gastrointestinal: Negative for nausea, vomiting, abdominal pain, diarrhea and abdominal distention.  Endocrine: Negative for polydipsia, polyphagia and polyuria.  Genitourinary: Negative for dysuria, frequency and hematuria.  Musculoskeletal: Negative for gait problem.       Right hip pain  Skin: Negative for color change, pallor and rash.  Neurological: Negative for dizziness, syncope, light-headedness and headaches.  Hematological: Does not bruise/bleed easily.  Psychiatric/Behavioral: Negative  for behavioral problems and confusion.      Allergies  Review of patient's allergies indicates no known allergies.  Home Medications   Prior to Admission medications   Medication Sig Start Date End Date Taking? Authorizing Provider  atorvastatin (LIPITOR) 40 MG tablet Take 40 mg by mouth daily.   Yes Historical Provider, MD  cholecalciferol (VITAMIN D) 1000 UNITS tablet Take 1,000 Units by mouth daily.   Yes Historical Provider, MD  levothyroxine (SYNTHROID, LEVOTHROID) 75 MCG tablet Take 75 mcg by mouth daily before breakfast.   Yes Historical Provider, MD  lisinopril (PRINIVIL,ZESTRIL) 20 MG tablet Take 20 mg by mouth daily.   Yes Historical Provider, MD  Nutritional Supplements (ESTROVEN PO) Take 1 tablet by mouth every evening.   Yes Historical Provider, MD  Omega-3 Fatty Acids (FISH OIL) 1000 MG CAPS Take 1,000 mg by mouth daily.   Yes Historical Provider, MD  Potassium Gluconate 595 MG CAPS Take 1 capsule by mouth daily.   Yes Historical Provider, MD  traMADol (ULTRAM) 50 MG tablet Take 50 mg by mouth 4 (four) times daily as needed for moderate pain or severe pain.   Yes Historical Provider, MD  vitamin E 400 UNIT capsule Take 400 Units by mouth daily.   Yes Historical Provider, MD  zolpidem (AMBIEN) 10 MG tablet Take 10 mg by mouth at bedtime as needed for sleep.   Yes Historical Provider, MD   BP 121/80 mmHg  Pulse 58  Temp(Src) 98.7 F (37.1 C) (Oral)  Resp  18  Ht 5\' 6"  (1.676 m)  Wt 152 lb (68.947 kg)  BMI 24.55 kg/m2  SpO2 89% Physical Exam  Constitutional: She is oriented to person, place, and time. She appears well-developed and well-nourished. No distress.  HENT:  Head: Normocephalic.  Eyes: Conjunctivae are normal. Pupils are equal, round, and reactive to light. No scleral icterus.  Neck: Normal range of motion. Neck supple. No thyromegaly present.  Cardiovascular: Normal rate and regular rhythm.  Exam reveals no gallop and no friction rub.   No murmur  heard. Pulmonary/Chest: Effort normal and breath sounds normal. No respiratory distress. She has no wheezes. She has no rales.  Abdominal: Soft. Bowel sounds are normal. She exhibits no distension. There is no tenderness. There is no rebound.  Musculoskeletal: Normal range of motion.       Legs: Tenderness over the right hip laterally and anteriorly. Not foreshortened or rotated  Neurological: She is alert and oriented to person, place, and time.  Skin: Skin is warm and dry. No rash noted.  Psychiatric: She has a normal mood and affect. Her behavior is normal.    ED Course  Procedures (including critical care time) Labs Review Labs Reviewed  CBC WITH DIFFERENTIAL/PLATELET - Abnormal; Notable for the following:    WBC 16.0 (*)    Hemoglobin 15.2 (*)    Neutro Abs 14.2 (*)    All other components within normal limits  BASIC METABOLIC PANEL - Abnormal; Notable for the following:    Glucose, Bld 139 (*)    All other components within normal limits  URINE CULTURE  PROTIME-INR  URINALYSIS, ROUTINE W REFLEX MICROSCOPIC (NOT AT [facility])  TYPE AND SCREEN    Imaging Review Dg Chest 1 View  05/24/2015  CLINICAL DATA:  Fall out of car.  Initial encounter. EXAM: CHEST 1 VIEW COMPARISON:  None. FINDINGS: There is no evidence of pulmonary edema, consolidation, pneumothorax, nodule or pleural fluid. The heart is mildly enlarged. Visualized bony thorax is unremarkable. IMPRESSION: No active disease.  Mild cardiomegaly. Electronically Signed   By: Aletta Edouard M.D.   On: 05/24/2015 21:51   Dg Hip Unilat With Pelvis 2-3 Views Right  05/24/2015  CLINICAL DATA:  Initial encounter for Pt brought in by [facility] for c/o fall; pt was trying to get out of a car and her right foot got caught in car and pt fell out of car seat landing on her right hip; pt is c/o increasing pain since it happened; no abduction noted; positive pulses EXAM: DG HIP (WITH OR WITHOUT PELVIS) 2-3V RIGHT COMPARISON:  None. FINDINGS:  Femoral heads are located. Sacroiliac joints are symmetric. Impaction fracture with probable comminution within the subcapital to mid femoral neck. IMPRESSION: Right femoral neck fracture. Electronically Signed   By: Abigail Miyamoto M.D.   On: 05/24/2015 21:54   I have personally reviewed and evaluated these images and lab results as part of my medical decision-making.   EKG Interpretation None      MDM   Final diagnoses:  Subcapital fracture of hip, right, closed, initial encounter ([place])    X-ray show right subcapital hip fracture. Care discussed with Dr. Dorna Leitz of [facility]. No orthopedic coverage available in [place] tonight. Arrangements will be made for transfer to [facility] for a.m. surgery. We will discussed with hospitalist regarding admission, transfer.    Tanna Furry, MD 05/24/15 2223

## 2015-05-24 NOTE — ED Notes (Signed)
Pt brought in by [facility] for c/o fall; pt was trying to get out of a car and her right foot got caught in car and pt fell out of car seat landing on her right hip; pt is c/o increasing pain since it happened; no abduction noted; positive pulses

## 2015-05-25 ENCOUNTER — Inpatient Hospital Stay (HOSPITAL_COMMUNITY): Payer: 59 | Admitting: Anesthesiology

## 2015-05-25 ENCOUNTER — Encounter (HOSPITAL_COMMUNITY): Payer: Self-pay | Admitting: Certified Registered Nurse Anesthetist

## 2015-05-25 ENCOUNTER — Inpatient Hospital Stay (HOSPITAL_COMMUNITY): Payer: 59

## 2015-05-25 ENCOUNTER — Encounter (HOSPITAL_COMMUNITY)
Admission: EM | Disposition: A | Discharge status: Home Health | Payer: Self-pay | Source: Home / Self Care | Attending: Internal Medicine

## 2015-05-25 DIAGNOSIS — I501 Left ventricular failure: Secondary | ICD-10-CM

## 2015-05-25 DIAGNOSIS — S72001A Fracture of unspecified part of neck of right femur, initial encounter for closed fracture: Secondary | ICD-10-CM

## 2015-05-25 HISTORY — PX: HIP PINNING,CANNULATED: SHX1758

## 2015-05-25 LAB — POCT I-STAT 4, (NA,K, GLUC, HGB,HCT)
GLUCOSE: 127 mg/dL — AB (ref 65–99)
HEMATOCRIT: 41 % (ref 36.0–46.0)
Hemoglobin: 13.9 g/dL (ref 12.0–15.0)
POTASSIUM: 3.9 mmol/L (ref 3.5–5.1)
SODIUM: 139 mmol/L (ref 135–145)

## 2015-05-25 LAB — URINALYSIS, ROUTINE W REFLEX MICROSCOPIC
Bilirubin Urine: NEGATIVE
Glucose, UA: NEGATIVE mg/dL
Ketones, ur: NEGATIVE mg/dL
LEUKOCYTES UA: NEGATIVE
NITRITE: NEGATIVE
Protein, ur: NEGATIVE mg/dL
SPECIFIC GRAVITY, URINE: 1.02 (ref 1.005–1.030)
pH: 6 (ref 5.0–8.0)

## 2015-05-25 LAB — CBC
HCT: 44.1 % (ref 36.0–46.0)
HEMOGLOBIN: 14.2 g/dL (ref 12.0–15.0)
MCH: 30.6 pg (ref 26.0–34.0)
MCHC: 32.2 g/dL (ref 30.0–36.0)
MCV: 95 fL (ref 78.0–100.0)
PLATELETS: 236 10*3/uL (ref 150–400)
RBC: 4.64 MIL/uL (ref 3.87–5.11)
RDW: 13.1 % (ref 11.5–15.5)
WBC: 12.2 10*3/uL — ABNORMAL HIGH (ref 4.0–10.5)

## 2015-05-25 LAB — URINE MICROSCOPIC-ADD ON: WBC, UA: NONE SEEN WBC/hpf (ref 0–5)

## 2015-05-25 LAB — SURGICAL PCR SCREEN
MRSA, PCR: NEGATIVE
STAPHYLOCOCCUS AUREUS: NEGATIVE

## 2015-05-25 LAB — POTASSIUM: Potassium: 4.8 mmol/L (ref 3.5–5.1)

## 2015-05-25 LAB — BASIC METABOLIC PANEL
Anion gap: 8 (ref 5–15)
BUN: 10 mg/dL (ref 6–20)
CHLORIDE: 102 mmol/L (ref 101–111)
CO2: 29 mmol/L (ref 22–32)
CREATININE: 0.78 mg/dL (ref 0.44–1.00)
Calcium: 8.8 mg/dL — ABNORMAL LOW (ref 8.9–10.3)
Glucose, Bld: 144 mg/dL — ABNORMAL HIGH (ref 65–99)
Potassium: 3.4 mmol/L — ABNORMAL LOW (ref 3.5–5.1)
SODIUM: 139 mmol/L (ref 135–145)

## 2015-05-25 LAB — TSH: TSH: 0.326 u[IU]/mL — AB (ref 0.350–4.500)

## 2015-05-25 LAB — MAGNESIUM
MAGNESIUM: 2.1 mg/dL (ref 1.7–2.4)
Magnesium: 1.7 mg/dL (ref 1.7–2.4)

## 2015-05-25 SURGERY — FIXATION, FEMUR, NECK, PERCUTANEOUS, USING SCREW
Anesthesia: General | Site: Hip | Laterality: Right

## 2015-05-25 MED ORDER — OMEGA-3-ACID ETHYL ESTERS 1 G PO CAPS
1.0000 g | ORAL_CAPSULE | Freq: Two times a day (BID) | ORAL | Status: DC
  Administered 2015-05-25 – 2015-05-28 (×6): 1 g via ORAL
  Filled 2015-05-25 (×6): qty 1

## 2015-05-25 MED ORDER — POTASSIUM CHLORIDE 20 MEQ/15ML (10%) PO SOLN
40.0000 meq | Freq: Once | ORAL | Status: AC
  Administered 2015-05-25: 40 meq via ORAL
  Filled 2015-05-25 (×2): qty 30

## 2015-05-25 MED ORDER — BUPIVACAINE HCL 0.5 % IJ SOLN
INTRAMUSCULAR | Status: DC | PRN
  Administered 2015-05-25: 30 mL

## 2015-05-25 MED ORDER — ACETAMINOPHEN 325 MG PO TABS: 650.0000 mg | ORAL_TABLET | Freq: Four times a day (QID) | ORAL | Status: DC | PRN

## 2015-05-25 MED ORDER — ALUM & MAG HYDROXIDE-SIMETH 200-200-20 MG/5ML PO SUSP: 30.0000 mL | ORAL | Status: DC | PRN

## 2015-05-25 MED ORDER — ALBUTEROL SULFATE (2.5 MG/3ML) 0.083% IN NEBU
2.5000 mg | INHALATION_SOLUTION | Freq: Three times a day (TID) | RESPIRATORY_TRACT | Status: DC
  Administered 2015-05-26 – 2015-05-28 (×6): 2.5 mg via RESPIRATORY_TRACT
  Filled 2015-05-25 (×6): qty 3

## 2015-05-25 MED ORDER — DEXTROSE-NACL 5-0.9 % IV SOLN
INTRAVENOUS | Status: DC
  Administered 2015-05-25: 03:00:00 via INTRAVENOUS

## 2015-05-25 MED ORDER — METOPROLOL TARTRATE 1 MG/ML IV SOLN: 5.0000 mg | INTRAVENOUS | Status: DC | PRN

## 2015-05-25 MED ORDER — SENNOSIDES-DOCUSATE SODIUM 8.6-50 MG PO TABS: 1.0000 | ORAL_TABLET | Freq: Every evening | ORAL | Status: DC | PRN

## 2015-05-25 MED ORDER — ROCURONIUM BROMIDE 100 MG/10ML IV SOLN
INTRAVENOUS | Status: DC | PRN
  Administered 2015-05-25: 50 mg via INTRAVENOUS

## 2015-05-25 MED ORDER — OXYCODONE HCL 5 MG/5ML PO SOLN: 5.0000 mg | Freq: Once | ORAL | Status: DC | PRN

## 2015-05-25 MED ORDER — BUPIVACAINE HCL (PF) 0.5 % IJ SOLN
INTRAMUSCULAR | Status: AC
  Filled 2015-05-25: qty 30

## 2015-05-25 MED ORDER — LEVOTHYROXINE SODIUM 50 MCG PO TABS
75.0000 ug | ORAL_TABLET | Freq: Every day | ORAL | Status: DC
  Administered 2015-05-26 – 2015-05-28 (×3): 75 ug via ORAL
  Filled 2015-05-25 (×3): qty 1

## 2015-05-25 MED ORDER — METHOCARBAMOL 500 MG PO TABS
500.0000 mg | ORAL_TABLET | Freq: Four times a day (QID) | ORAL | Status: DC | PRN
  Administered 2015-05-26 – 2015-05-28 (×4): 500 mg via ORAL
  Filled 2015-05-25 (×5): qty 1

## 2015-05-25 MED ORDER — GLYCOPYRROLATE 0.2 MG/ML IJ SOLN
INTRAMUSCULAR | Status: DC | PRN
  Administered 2015-05-25: 0.6 mg via INTRAVENOUS

## 2015-05-25 MED ORDER — POTASSIUM CHLORIDE IN NACL 40-0.9 MEQ/L-% IV SOLN: INTRAVENOUS | Status: DC

## 2015-05-25 MED ORDER — CEFAZOLIN SODIUM-DEXTROSE 2-3 GM-% IV SOLR
2.0000 g | Freq: Four times a day (QID) | INTRAVENOUS | Status: AC
  Administered 2015-05-25 (×2): 2 g via INTRAVENOUS
  Filled 2015-05-25 (×2): qty 50

## 2015-05-25 MED ORDER — FENTANYL CITRATE (PF) 250 MCG/5ML IJ SOLN
INTRAMUSCULAR | Status: AC
  Filled 2015-05-25: qty 5

## 2015-05-25 MED ORDER — HYDROMORPHONE HCL 1 MG/ML IJ SOLN: 0.2500 mg | INTRAMUSCULAR | Status: DC | PRN

## 2015-05-25 MED ORDER — ALBUTEROL SULFATE (2.5 MG/3ML) 0.083% IN NEBU: 2.5000 mg | INHALATION_SOLUTION | RESPIRATORY_TRACT | Status: DC | PRN

## 2015-05-25 MED ORDER — POTASSIUM CHLORIDE IN NACL 40-0.9 MEQ/L-% IV SOLN
INTRAVENOUS | Status: DC
  Administered 2015-05-25: 50 mL/h via INTRAVENOUS
  Filled 2015-05-25 (×2): qty 1000

## 2015-05-25 MED ORDER — ASPIRIN EC 325 MG PO TBEC
325.0000 mg | DELAYED_RELEASE_TABLET | Freq: Two times a day (BID) | ORAL | Status: DC
  Administered 2015-05-25 – 2015-05-28 (×6): 325 mg via ORAL
  Filled 2015-05-25 (×6): qty 1

## 2015-05-25 MED ORDER — FENTANYL CITRATE (PF) 100 MCG/2ML IJ SOLN
INTRAMUSCULAR | Status: DC | PRN
  Administered 2015-05-25: 50 ug via INTRAVENOUS
  Administered 2015-05-25: 100 ug via INTRAVENOUS
  Administered 2015-05-25: 50 ug via INTRAVENOUS

## 2015-05-25 MED ORDER — HEPARIN SODIUM (PORCINE) 5000 UNIT/ML IJ SOLN: 5000.0000 [IU] | Freq: Once | INTRAMUSCULAR | Status: DC

## 2015-05-25 MED ORDER — NEOSTIGMINE METHYLSULFATE 10 MG/10ML IV SOLN
INTRAVENOUS | Status: DC | PRN
  Administered 2015-05-25: 4 mg via INTRAVENOUS

## 2015-05-25 MED ORDER — POTASSIUM CHLORIDE 10 MEQ/100ML IV SOLN
10.0000 meq | INTRAVENOUS | Status: DC
  Administered 2015-05-25: 10 meq via INTRAVENOUS
  Filled 2015-05-25: qty 100

## 2015-05-25 MED ORDER — ONDANSETRON HCL 4 MG/2ML IJ SOLN: 4.0000 mg | Freq: Four times a day (QID) | INTRAMUSCULAR | Status: DC | PRN

## 2015-05-25 MED ORDER — HYDROMORPHONE HCL 1 MG/ML IJ SOLN
1.0000 mg | INTRAMUSCULAR | Status: DC | PRN
  Administered 2015-05-25 (×3): 1 mg via INTRAVENOUS
  Filled 2015-05-25 (×3): qty 1

## 2015-05-25 MED ORDER — LACTATED RINGERS IV SOLN
INTRAVENOUS | Status: DC
  Administered 2015-05-25 (×2): via INTRAVENOUS

## 2015-05-25 MED ORDER — VITAMIN D 1000 UNITS PO TABS
1000.0000 [IU] | ORAL_TABLET | Freq: Every day | ORAL | Status: DC
  Administered 2015-05-26 – 2015-05-28 (×3): 1000 [IU] via ORAL
  Filled 2015-05-25 (×3): qty 1

## 2015-05-25 MED ORDER — CEFAZOLIN SODIUM-DEXTROSE 2-3 GM-% IV SOLR
2.0000 g | INTRAVENOUS | Status: DC
  Filled 2015-05-25: qty 50

## 2015-05-25 MED ORDER — ONDANSETRON HCL 4 MG/2ML IJ SOLN
INTRAMUSCULAR | Status: DC | PRN
  Administered 2015-05-25: 4 mg via INTRAVENOUS

## 2015-05-25 MED ORDER — SODIUM CHLORIDE 0.9 % IJ SOLN: 3.0000 mL | Freq: Two times a day (BID) | INTRAMUSCULAR | Status: DC

## 2015-05-25 MED ORDER — CEFAZOLIN SODIUM-DEXTROSE 2-3 GM-% IV SOLR
INTRAVENOUS | Status: DC | PRN
  Administered 2015-05-25: 2 g via INTRAVENOUS

## 2015-05-25 MED ORDER — METHOCARBAMOL 1000 MG/10ML IJ SOLN
500.0000 mg | Freq: Four times a day (QID) | INTRAMUSCULAR | Status: DC | PRN
  Administered 2015-05-25: 500 mg via INTRAVENOUS
  Filled 2015-05-25 (×3): qty 5

## 2015-05-25 MED ORDER — ATORVASTATIN CALCIUM 40 MG PO TABS
40.0000 mg | ORAL_TABLET | Freq: Every day | ORAL | Status: DC
Start: 2015-05-25 — End: 2015-05-28
  Administered 2015-05-26 – 2015-05-28 (×3): 40 mg via ORAL
  Filled 2015-05-25 (×3): qty 1

## 2015-05-25 MED ORDER — PROPOFOL 10 MG/ML IV BOLUS
INTRAVENOUS | Status: AC
  Filled 2015-05-25: qty 20

## 2015-05-25 MED ORDER — 0.9 % SODIUM CHLORIDE (POUR BTL) OPTIME
TOPICAL | Status: DC | PRN
  Administered 2015-05-25: 1000 mL

## 2015-05-25 MED ORDER — CARVEDILOL 3.125 MG PO TABS
3.1250 mg | ORAL_TABLET | Freq: Two times a day (BID) | ORAL | Status: DC
  Administered 2015-05-25 – 2015-05-28 (×6): 3.125 mg via ORAL
  Filled 2015-05-25 (×7): qty 1

## 2015-05-25 MED ORDER — LACTATED RINGERS IV SOLN
INTRAVENOUS | Status: DC | PRN
  Administered 2015-05-25: 13:00:00 via INTRAVENOUS

## 2015-05-25 MED ORDER — ACETAMINOPHEN 650 MG RE SUPP: 650.0000 mg | Freq: Four times a day (QID) | RECTAL | Status: DC | PRN

## 2015-05-25 MED ORDER — MORPHINE SULFATE (PF) 2 MG/ML IV SOLN
2.0000 mg | INTRAVENOUS | Status: DC | PRN
  Administered 2015-05-25 – 2015-05-27 (×10): 2 mg via INTRAVENOUS
  Filled 2015-05-25 (×10): qty 1

## 2015-05-25 MED ORDER — EPHEDRINE SULFATE 50 MG/ML IJ SOLN
INTRAMUSCULAR | Status: DC | PRN
  Administered 2015-05-25: 10 mg via INTRAVENOUS

## 2015-05-25 MED ORDER — LIDOCAINE HCL (CARDIAC) 20 MG/ML IV SOLN
INTRAVENOUS | Status: DC | PRN
  Administered 2015-05-25: 60 mg via INTRAVENOUS

## 2015-05-25 MED ORDER — PROPOFOL 10 MG/ML IV BOLUS
INTRAVENOUS | Status: DC | PRN
  Administered 2015-05-25: 150 mg via INTRAVENOUS

## 2015-05-25 MED ORDER — CHLORHEXIDINE GLUCONATE 4 % EX LIQD
60.0000 mL | Freq: Once | CUTANEOUS | Status: DC
  Filled 2015-05-25: qty 60

## 2015-05-25 MED ORDER — HYDROCODONE-ACETAMINOPHEN 5-325 MG PO TABS
1.0000 | ORAL_TABLET | Freq: Four times a day (QID) | ORAL | Status: DC | PRN
  Administered 2015-05-25: 1 via ORAL
  Administered 2015-05-26 – 2015-05-27 (×4): 2 via ORAL
  Administered 2015-05-27: 1 via ORAL
  Administered 2015-05-28 (×3): 2 via ORAL
  Filled 2015-05-25 (×6): qty 2
  Filled 2015-05-25: qty 1
  Filled 2015-05-25 (×4): qty 2

## 2015-05-25 MED ORDER — OXYCODONE HCL 5 MG PO TABS: 5.0000 mg | ORAL_TABLET | Freq: Once | ORAL | Status: DC | PRN

## 2015-05-25 MED ORDER — DOCUSATE SODIUM 100 MG PO CAPS
100.0000 mg | ORAL_CAPSULE | Freq: Two times a day (BID) | ORAL | Status: DC
  Administered 2015-05-25 – 2015-05-28 (×6): 100 mg via ORAL
  Filled 2015-05-25 (×6): qty 1

## 2015-05-25 MED ORDER — DEXTROSE-NACL 5-0.9 % IV SOLN: INTRAVENOUS | Status: DC

## 2015-05-25 MED ORDER — PHENYLEPHRINE HCL 10 MG/ML IJ SOLN
10.0000 mg | INTRAVENOUS | Status: DC | PRN
  Administered 2015-05-25: 40 ug/min via INTRAVENOUS

## 2015-05-25 MED ORDER — HYDROMORPHONE HCL 1 MG/ML IJ SOLN
INTRAMUSCULAR | Status: DC
Start: 2015-05-25 — End: 2015-05-25
  Filled 2015-05-25: qty 1

## 2015-05-25 MED ORDER — LISINOPRIL 20 MG PO TABS: 20.0000 mg | ORAL_TABLET | Freq: Every day | ORAL | Status: DC

## 2015-05-25 MED ORDER — PROMETHAZINE HCL 25 MG/ML IJ SOLN: 6.2500 mg | INTRAMUSCULAR | Status: DC | PRN

## 2015-05-25 MED ORDER — ASPIRIN EC 81 MG PO TBEC: 81.0000 mg | DELAYED_RELEASE_TABLET | Freq: Every day | ORAL | Status: DC

## 2015-05-25 MED ORDER — ASPIRIN EC 325 MG PO TBEC: 325.0000 mg | DELAYED_RELEASE_TABLET | Freq: Two times a day (BID) | ORAL | Status: DC

## 2015-05-25 MED ORDER — MAGNESIUM SULFATE IN D5W 10-5 MG/ML-% IV SOLN
1.0000 g | Freq: Once | INTRAVENOUS | Status: AC
  Administered 2015-05-25: 1 g via INTRAVENOUS
  Filled 2015-05-25: qty 100

## 2015-05-25 MED ORDER — ZOLPIDEM TARTRATE 5 MG PO TABS
5.0000 mg | ORAL_TABLET | Freq: Every evening | ORAL | Status: DC | PRN
  Administered 2015-05-25 – 2015-05-27 (×4): 5 mg via ORAL
  Filled 2015-05-25 (×4): qty 1

## 2015-05-25 MED ORDER — HYDROCODONE-ACETAMINOPHEN 5-325 MG PO TABS: 1.0000 | ORAL_TABLET | Freq: Four times a day (QID) | ORAL | Status: DC | PRN

## 2015-05-25 MED ORDER — LISINOPRIL 10 MG PO TABS
10.0000 mg | ORAL_TABLET | Freq: Every day | ORAL | Status: DC
  Administered 2015-05-26 – 2015-05-28 (×3): 10 mg via ORAL
  Filled 2015-05-25 (×3): qty 1

## 2015-05-25 MED ORDER — PHENYLEPHRINE HCL 10 MG/ML IJ SOLN
INTRAMUSCULAR | Status: DC | PRN
  Administered 2015-05-25 (×2): 120 ug via INTRAVENOUS

## 2015-05-25 MED ORDER — MAGNESIUM SULFATE 50 % IJ SOLN
INTRAMUSCULAR | Status: DC | PRN
  Administered 2015-05-25: 2 g via INTRAVENOUS

## 2015-05-25 MED ORDER — MAGNESIUM SULFATE 2 GM/50ML IV SOLN
2.0000 g | Freq: Once | INTRAVENOUS | Status: DC
  Filled 2015-05-25: qty 50

## 2015-05-25 MED ORDER — ONDANSETRON HCL 4 MG PO TABS: 4.0000 mg | ORAL_TABLET | Freq: Four times a day (QID) | ORAL | Status: DC | PRN

## 2015-05-25 MED ORDER — MIDAZOLAM HCL 2 MG/2ML IJ SOLN
INTRAMUSCULAR | Status: AC
  Filled 2015-05-25: qty 2

## 2015-05-25 MED ORDER — ALBUTEROL SULFATE (2.5 MG/3ML) 0.083% IN NEBU
2.5000 mg | INHALATION_SOLUTION | Freq: Four times a day (QID) | RESPIRATORY_TRACT | Status: DC
  Administered 2015-05-25: 2.5 mg via RESPIRATORY_TRACT
  Filled 2015-05-25 (×3): qty 3

## 2015-05-25 SURGICAL SUPPLY — 45 items
COVER PERINEAL POST (MISCELLANEOUS) ×3 IMPLANT
COVER SURGICAL LIGHT HANDLE (MISCELLANEOUS) ×3 IMPLANT
DECANTER SPIKE VIAL GLASS SM (MISCELLANEOUS) ×2 IMPLANT
DRAPE STERI IOBAN 125X83 (DRAPES) ×3 IMPLANT
DRILL BIT 7/64X5 (BIT) ×3 IMPLANT
DRSG MEPILEX BORDER 4X4 (GAUZE/BANDAGES/DRESSINGS) ×3 IMPLANT
DURAPREP 26ML APPLICATOR (WOUND CARE) ×3 IMPLANT
ELECT REM PT RETURN 9FT ADLT (ELECTROSURGICAL) ×3
ELECTRODE REM PT RTRN 9FT ADLT (ELECTROSURGICAL) ×1 IMPLANT
FACESHIELD WRAPAROUND (MASK) ×6 IMPLANT
FACESHIELD WRAPAROUND OR TEAM (MASK) ×2 IMPLANT
GLOVE BIO SURGEON STRL SZ 6.5 (GLOVE) ×1 IMPLANT
GLOVE BIO SURGEONS STRL SZ 6.5 (GLOVE) ×1
GLOVE BIOGEL PI IND STRL 6.5 (GLOVE) IMPLANT
GLOVE BIOGEL PI IND STRL 8 (GLOVE) ×2 IMPLANT
GLOVE BIOGEL PI INDICATOR 6.5 (GLOVE) ×4
GLOVE BIOGEL PI INDICATOR 8 (GLOVE) ×4
GLOVE ECLIPSE 6.5 STRL STRAW (GLOVE) ×2 IMPLANT
GLOVE ECLIPSE 7.5 STRL STRAW (GLOVE) ×6 IMPLANT
GOWN STRL REUS W/ TWL LRG LVL3 (GOWN DISPOSABLE) ×1 IMPLANT
GOWN STRL REUS W/ TWL XL LVL3 (GOWN DISPOSABLE) ×2 IMPLANT
GOWN STRL REUS W/TWL LRG LVL3 (GOWN DISPOSABLE) ×6
GOWN STRL REUS W/TWL XL LVL3 (GOWN DISPOSABLE) ×6
KIT ROOM TURNOVER OR (KITS) ×3 IMPLANT
LINER BOOT UNIVERSAL DISP (MISCELLANEOUS) ×3 IMPLANT
MANIFOLD NEPTUNE II (INSTRUMENTS) ×3 IMPLANT
NDL HYPO 25GX1X1/2 BEV (NEEDLE) IMPLANT
NEEDLE HYPO 25GX1X1/2 BEV (NEEDLE) ×3 IMPLANT
NS IRRIG 1000ML POUR BTL (IV SOLUTION) ×3 IMPLANT
PACK GENERAL/GYN (CUSTOM PROCEDURE TRAY) ×3 IMPLANT
PAD ARMBOARD 7.5X6 YLW CONV (MISCELLANEOUS) ×6 IMPLANT
PIN GUIDE DRILL TIP 2.8X300 (DRILL) ×6 IMPLANT
SCREW CANN FT 95X8 NS LNG (Screw) IMPLANT
SCREW CANNULATED 8.0X95 (Screw) ×3 IMPLANT
SCREW PARTIAL THREAD 8.0X90MM (Screw) ×4 IMPLANT
STAPLER VISISTAT 35W (STAPLE) ×3 IMPLANT
SUT VIC AB 0 CT1 27 (SUTURE) ×3
SUT VIC AB 0 CT1 27XBRD ANBCTR (SUTURE) ×1 IMPLANT
SUT VIC AB 1 CT1 27 (SUTURE) ×3
SUT VIC AB 1 CT1 27XBRD ANBCTR (SUTURE) IMPLANT
SUT VIC AB 2-0 CT1 36 (SUTURE) ×3 IMPLANT
SYR CONTROL 10ML LL (SYRINGE) ×2 IMPLANT
TOWEL OR 17X24 6PK STRL BLUE (TOWEL DISPOSABLE) ×3 IMPLANT
TOWEL OR 17X26 10 PK STRL BLUE (TOWEL DISPOSABLE) ×3 IMPLANT
WATER STERILE IRR 1000ML POUR (IV SOLUTION) ×3 IMPLANT

## 2015-05-25 NOTE — Progress Notes (Addendum)
Patient Demographics:    Penny Hicks, is a 64 y.o. female, DOB - 06-05-1951, [id number]  Admit date - 05/24/2015   Admitting Physician [name], MD  Outpatient Primary MD for the patient is [name], MD  LOS - 1   Chief Complaint  Patient presents with  . Fall        Subjective:    Rollene Fare today has, No headache, No chest pain, No abdominal pain - No Nausea, No new weakness tingling or numbness, No Cough - SOB. Minimal right hip pain and discomfort.   Assessment  & Plan :    Principal Problem:   Hip fracture, right (HCC) Active Problems:   Palpitations   TOBACCO ABUSE   Essential hypertension   Hip fracture (HCC)   1. Mechanical fall with right femoral neck fracture- seen by orthopedics due for surgical correction later today.  Cardio-Pulm Risk stratification for surgery and recommendations to minimize the same:-  A.Cardio-Pulmonary Risk -  this patient is a low to moderate risk  for adverse Cardio-Pulmonary  Outcome  from surgery, the risks and benefits were discussed and acceptable to the patient.  Recommendations for optimizing Cardio-Pulmonary  Risk risk factors  1. Keep SBP<140, HR<85, use Lopressor 5mg  IV q4hrs PRN, or B.Blocker drip PRN. 2. Moniotr I&Os. 3. Minimal sedation and Narcotics. 4. Good pulmunary toilet. 5. PRN Nebs and as needed oxygen to keep Pox>90% 6. Hb>8, transfuse as needed- Lasix 10mg  IV after each unit PRBC Transfused.   B.Bleeding Risk - no previous surgical complications, no easy bruising,  Antiplate meds none at home. Received 81 mg of aspirin post admission.   Lab Results  Component Value Date   PLT 236 05/25/2015                  Lab Results  Component Value Date   INR 1.07 05/24/2015      Will request  Surgeon to please Order DVT prophylaxis of his/her choice, along with activity, weight bearing precautions and diet if appropriate.      2. Few PVCs on EKG. Asymptomatic, no known heart issues, excellent METs and climb 3-4 flight of stairs without any discomfort or shortness of breath, magnesium was borderline and replaced, as needed IV Lopressor. No acute changes on EKG. No echo on file, check TTE for EF, place on low dose Coreg, keep electrolytes stable, Tele.   3. Ongoing smoking. Counseled to quit. Supportive care for now. Added flutter valve and IS.   4. Dyslipidemia. Continue home dose statin.   5. Hypothyroidism. On Synthroid continue.   6. Essential hypertension. On his inhibitor. As needed IV Lopressor.   7. Hypokalemia. Replaced.    Code Status : Full  Family Communication  : None present  Disposition Plan  : To be decided  Consults  :  Orthopedics  Procedures  :   TTE   Due for surgical correction of her right hip fracture on 05/25/2015  DVT Prophylaxis  :  Heparin  postsurgery will request orthopedics to address DVT prophylaxis  Lab Results  Component Value Date   PLT 236 05/25/2015    Inpatient Medications  Scheduled Meds: . aspirin EC  81 mg Oral Daily  . atorvastatin  40  mg Oral Daily  . cholecalciferol  1,000 Units Oral Daily  . heparin  5,000 Units Subcutaneous Once  . levothyroxine  75 mcg Oral QAC breakfast  . lisinopril  20 mg Oral Daily  . omega-3 acid ethyl esters  1 g Oral BID  . sodium chloride  3 mL Intravenous Q12H   Continuous Infusions: . dextrose 5 % and 0.9% NaCl 75 mL/hr at 05/25/15 0259   PRN Meds:.HYDROmorphone (DILAUDID) injection, metoprolol, ondansetron **OR** ondansetron (ZOFRAN) IV, zolpidem  Antibiotics  :     Anti-infectives    Start     Dose/Rate Route Frequency Ordered Stop   05/25/15 1130  ceFAZolin (ANCEF) IVPB 2 g/50 mL premix  Status:  Discontinued     2 g 100 mL/hr over 30 Minutes Intravenous To  [facility] 05/25/15 0219 05/25/15 0854        Objective:   Filed Vitals:   05/25/15 0015 05/25/15 0030 05/25/15 0202 05/25/15 0501  BP:  125/94 120/64 121/74  Pulse: 34  101 86  Temp:   98.6 F (37 C) 99 F (37.2 C)  TempSrc:   Oral Oral  Resp: 16 24 19 18   Height:      Weight:      SpO2: 85%  89% 92%    Wt Readings from Last 3 Encounters:  05/24/15 68.947 kg (152 lb)  07/05/10 68.72 kg (151 lb 8 oz)     Intake/Output Summary (Last 24 hours) at 05/25/15 1011 Last data filed at 05/25/15 0900  Gross per 24 hour  Intake      0 ml  Output    100 ml  Net   -100 ml     Physical Exam  Awake Alert, Oriented X 3, No new F.N deficits, Normal affect [place].AT,PERRAL Supple Neck,No JVD, No cervical lymphadenopathy appriciated.  Symmetrical Chest wall movement, Good air movement bilaterally, CTAB RRR,No Gallops,Rubs or new Murmurs, No Parasternal Heave +ve B.Sounds, Abd Soft, No tenderness, No organomegaly appriciated, No rebound - guarding or rigidity. No Cyanosis, Clubbing or edema, No new Rash or bruise , right leg externally rotated    Data Review:   Micro Results Recent Results (from the past 240 hour(s))  Surgical pcr screen     Status: None   Collection Time: 05/25/15  4:47 AM  Result Value Ref Range Status   MRSA, PCR NEGATIVE NEGATIVE Final   Staphylococcus aureus NEGATIVE NEGATIVE Final    Comment:        The Xpert SA Assay (FDA approved for NASAL specimens in patients over 62 years of age), is one component of a comprehensive surveillance program.  Test performance has been validated by [facility] for patients greater than or equal to 65 year old. It is not intended to diagnose infection nor to guide or monitor treatment.     Radiology Reports Dg Chest 1 View  05/24/2015  CLINICAL DATA:  Fall out of car.  Initial encounter. EXAM: CHEST 1 VIEW COMPARISON:  None. FINDINGS: There is no evidence of pulmonary edema, consolidation, pneumothorax,  nodule or pleural fluid. The heart is mildly enlarged. Visualized bony thorax is unremarkable. IMPRESSION: No active disease.  Mild cardiomegaly. Electronically Signed   By: Aletta Edouard M.D.   On: 05/24/2015 21:51   Dg Hip Unilat With Pelvis 2-3 Views Right  05/24/2015  CLINICAL DATA:  Initial encounter for Pt brought in by [facility] for c/o fall; pt was trying to get out of a car and her right foot got caught  in car and pt fell out of car seat landing on her right hip; pt is c/o increasing pain since it happened; no abduction noted; positive pulses EXAM: DG HIP (WITH OR WITHOUT PELVIS) 2-3V RIGHT COMPARISON:  None. FINDINGS: Femoral heads are located. Sacroiliac joints are symmetric. Impaction fracture with probable comminution within the subcapital to mid femoral neck. IMPRESSION: Right femoral neck fracture. Electronically Signed   By: Abigail Miyamoto M.D.   On: 05/24/2015 21:54     CBC  Recent Labs Lab 05/24/15 2120 05/25/15 0420  WBC 16.0* 12.2*  HGB 15.2* 14.2  HCT 44.5 44.1  PLT 246 236  MCV 93.5 95.0  MCH 31.9 30.6  MCHC 34.2 32.2  RDW 12.7 13.1  LYMPHSABS 1.0  --   MONOABS 0.8  --   EOSABS 0.0  --   BASOSABS 0.0  --     Chemistries   Recent Labs Lab 05/24/15 2120 05/25/15 0420  NA 139 139  K 3.8 3.4*  CL 105 102  CO2 27 29  GLUCOSE 139* 144*  BUN 15 10  CREATININE 0.75 0.78  CALCIUM 9.0 8.8*  MG  --  1.7   ------------------------------------------------------------------------------------------------------------------ estimated creatinine clearance is 65.6 mL/min (by C-G formula based on Cr of 0.78). ------------------------------------------------------------------------------------------------------------------ No results for input(s): HGBA1C in the last 72 hours. ------------------------------------------------------------------------------------------------------------------ No results for input(s): CHOL, HDL, LDLCALC, TRIG, CHOLHDL, LDLDIRECT in the last 72  hours. ------------------------------------------------------------------------------------------------------------------  Recent Labs  05/25/15 0420  TSH 0.326*   ------------------------------------------------------------------------------------------------------------------ No results for input(s): VITAMINB12, FOLATE, FERRITIN, TIBC, IRON, RETICCTPCT in the last 72 hours.  Coagulation profile  Recent Labs Lab 05/24/15 2120  INR 1.07    No results for input(s): DDIMER in the last 72 hours.  Cardiac Enzymes No results for input(s): CKMB, TROPONINI, MYOGLOBIN in the last 168 hours.  Invalid input(s): CK ------------------------------------------------------------------------------------------------------------------ Invalid input(s): POCBNP   Time Spent in minutes   35   SINGH,PRASHANT K M.D on 05/25/2015 at 10:11 AM  Between 7am to 7pm - Pager - [phone]  After 7pm go to [email or web address] - password [facility]  [facility] -  Office  [phone]

## 2015-05-25 NOTE — Anesthesia Postprocedure Evaluation (Signed)
Anesthesia Post Note  Patient: ARRIYANNA ARGUDO  Procedure(s) Performed: Procedure(s) (LRB): CANNULATED HIP PINNING (Right)  Patient location during evaluation: PACU Anesthesia Type: General Level of consciousness: awake and alert Pain management: pain level controlled Vital Signs Assessment: post-procedure vital signs reviewed and stable Respiratory status: spontaneous breathing, nonlabored ventilation, respiratory function stable and patient connected to nasal cannula oxygen Cardiovascular status: blood pressure returned to baseline and stable Postop Assessment: no signs of nausea or vomiting Anesthetic complications: no Comments: Patient with PVCs and even periods of bigeminy in OR under general anesthesia. No episodes of non-sustained V tacc or trigeminy. In pacu appears resolved after magnesium and potassium replacement. Hemodynamics stable.     Last Vitals:  Filed Vitals:   05/25/15 1333 05/25/15 1345  BP: 168/91 163/85  Pulse: 108 98  Temp: 36.8 C   Resp: 19 19    Last Pain:  Filed Vitals:   05/25/15 1358  PainSc: 4                  Zenaida Deed

## 2015-05-25 NOTE — Progress Notes (Signed)
  Echocardiogram 2D Echocardiogram has been performed.  Diamond Nickel 05/25/2015, 3:43 PM

## 2015-05-25 NOTE — ED Notes (Signed)
Attempted to call report to 6N @ [facility]. Was asked to call back in 30 minutes because nurse was busy. Explained that [facility] was on their way and pt would likely be there in 30 minutes. Stated they would call me back.

## 2015-05-25 NOTE — Care Management Note (Signed)
Case Management Note  Patient Details  Name: Penny Hicks MRN: [id number] Date of Birth: 1950-10-06  Subjective/Objective:    Pt admitted on 05/24/15 s/p fall with Rt femoral neck fracture.  PTA, pt independent, lives with spouse.                 Action/Plan: Pt to OR today.  Will follow post op for dc planning.    Expected Discharge Date:                  Expected Discharge Plan:     In-House Referral:     Discharge planning Services   CM referral  Post Acute Care Choice:    Choice offered to:     DME Arranged:    DME Agency:     HH Arranged:    HH Agency:     Status of Service:   In process, will continue to follow  Medicare Important Message Given:    Date Medicare IM Given:    Medicare IM give by:    Date Additional Medicare IM Given:    Additional Medicare Important Message give by:     If discussed at [place] of Stay Meetings, dates discussed:    Additional Comments:  Reinaldo Raddle, RN, BSN  Trauma/Neuro ICU Case Manager [phone]

## 2015-05-25 NOTE — Transfer of Care (Signed)
Immediate Anesthesia Transfer of Care Note  Patient: Penny Hicks  Procedure(s) Performed: Procedure(s): CANNULATED HIP PINNING (Right)  Patient Location: PACU  Anesthesia Type:General  Level of Consciousness: awake, alert , oriented and patient cooperative  Airway & Oxygen Therapy: Patient Spontanous Breathing and Patient connected to nasal cannula oxygen  Post-op Assessment: Report given to RN, Post -op Vital signs reviewed and stable and Patient moving all extremities  Post vital signs: Reviewed and stable  Last Vitals:  Filed Vitals:   05/25/15 0501 05/25/15 1333  BP: 121/74 168/91  Pulse: 86 108  Temp: 37.2 C 36.8 C  Resp: 18 19    Complications: No apparent anesthesia complications

## 2015-05-25 NOTE — Progress Notes (Signed)
Called ortho tech. Overhead trapeze bars are on back order. Patient got on the waiting list

## 2015-05-25 NOTE — Anesthesia Procedure Notes (Signed)
Procedure Name: Intubation Date/Time: 05/25/2015 12:46 PM Performed by: Williemae Area B Pre-anesthesia Checklist: Patient identified, Emergency Drugs available, Suction available and Patient being monitored Patient Re-evaluated:Patient Re-evaluated prior to inductionOxygen Delivery Method: Circle system utilized Preoxygenation: Pre-oxygenation with 100% oxygen Intubation Type: IV induction Ventilation: Mask ventilation without difficulty Laryngoscope Size: Mac and 4 Grade View: Grade II Tube type: Oral Tube size: 7.5 mm Number of attempts: 1 Airway Equipment and Method: Stylet Placement Confirmation: ETT inserted through vocal cords under direct vision,  breath sounds checked- equal and bilateral and positive ETCO2 Tube secured with: Tape Dental Injury: Teeth and Oropharynx as per pre-operative assessment

## 2015-05-25 NOTE — Progress Notes (Signed)
Report called to OR  

## 2015-05-25 NOTE — Clinical Social Work Note (Signed)
CSW received consult for SNF, CSW awaiting therapy recommendation to determine if patient will need SNF.  CSW to continue to follow patient's progress and will meet with patient once therapy has seen patient and given recommendations.  Jones Broom. [place], MSW, [place] 05/25/2015 6:15 PM

## 2015-05-25 NOTE — Anesthesia Preprocedure Evaluation (Addendum)
Anesthesia Evaluation  Patient identified by MRN, date of birth, ID band Patient awake    Reviewed: Allergy & Precautions, H&P , NPO status , Patient's Chart, lab work & pertinent test results  History of Anesthesia Complications Negative for: history of anesthetic complications  Airway Mallampati: II  TM Distance: >3 FB Neck ROM: full    Dental no notable dental hx. (+) Teeth Intact, Caps, Dental Advisory Given   Pulmonary Current Smoker,    Pulmonary exam normal breath sounds clear to auscultation       Cardiovascular hypertension, Pt. on medications Normal cardiovascular exam Rhythm:regular Rate:Normal     Neuro/Psych PSYCHIATRIC DISORDERS negative neurological ROS     GI/Hepatic negative GI ROS, Neg liver ROS,   Endo/Other  Hypothyroidism   Renal/GU negative Renal ROS     Musculoskeletal   Abdominal   Peds  Hematology negative hematology ROS (+)   Anesthesia Other Findings   Reproductive/Obstetrics negative OB ROS                           Anesthesia Physical Anesthesia Plan  ASA: II  Anesthesia Plan: General   Post-op Pain Management:    Induction: Intravenous  Airway Management Planned: Oral ETT  Additional Equipment:   Intra-op Plan:   Post-operative Plan: Extubation in OR  Informed Consent: I have reviewed the patients History and Physical, chart, labs and discussed the procedure including the risks, benefits and alternatives for the proposed anesthesia with the patient or authorized representative who has indicated his/her understanding and acceptance.   Dental Advisory Given  Plan Discussed with: Anesthesiologist, CRNA and Surgeon  Anesthesia Plan Comments: (Pt declines spinal, desires GA)       Anesthesia Quick Evaluation

## 2015-05-25 NOTE — Op Note (Signed)
NAMERAMONICA, BOURDIER NO.:  [id number]  MEDICAL RECORD NO.:  [id number]  LOCATION:  6N15C                        FACILITY:  [facility]  PHYSICIAN:  Alta Corning, M.D.   DATE OF BIRTH:  [birth year]  DATE OF PROCEDURE:  05/25/2015 DATE OF DISCHARGE:                              OPERATIVE REPORT   PREOPERATIVE DIAGNOSIS:  Impacted femoral neck fracture, right.  POSTOPERATIVE DIAGNOSIS:  Impacted femoral neck fracture, right.  PROCEDURES: 1. Cannulated screw fixation of impacted femoral neck fracture, right. 2. Interpretation of multiple intraoperative fluoroscopic images.  SURGEON:  Alta Corning, M.D.  ASSISTANT:  Gary Fleet, P.A.  ANESTHESIA:  General.  BRIEF HISTORY:  Ms. Reisenauer is a 64 year old female, fell out of her car.  She suffered an impacted femoral neck fracture on the right side. She was seen at [facility] where they had no ability to take care of her orthopedically and she was transferred down to [facility], we were consulted. X-rays to me showed a dead on impacted femoral neck fracture.  We talked to her about treatment options including the possibility of total hip, but I thought given her young age, no hip disease, I felt that cannulated screw fixation was appropriate and we took her to the operating room for this procedure.  DESCRIPTION OF PROCEDURE:  The patient was taken to the operating room. After adequate anesthesia was obtained with general anesthetic, the patient was placed supine on the operating table.  The right hip was then prepped and draped in usual sterile fashion.  She was placed onto the traction Jackson bed with no traction prior to prep and drape.  At this point, attention was turned to the right hip where fluoroscopically, she was examined and noted to still have an impacted femoral neck fracture.  We then made some lines on the skin and then made a small incision to dissect directly down to the lateral femur and from  the lateral femur, we advanced three guidewires well spaced in an inverted triangle to allow to control rotation best.  We then advanced three cannulated screws with 16-mm threads across the fracture site, got excellent fixation of the fracture site in situ.  At that point, the hip was put through a range of motion and rotation, nothing was moving or angulating.  Felt like we had got pretty good compression across the fracture.  At this point, the wound was irrigated, closed in layers and a sterile compressive dressing was applied.  The patient was taken to the recovery, she was noted to be in satisfactory condition. Estimated blood loss for the procedure was minimal.     Alta Corning, M.D.     Corliss Skains  D:  05/25/2015  T:  05/25/2015  Job:  [id number]

## 2015-05-25 NOTE — Brief Op Note (Signed)
05/24/2015 - 05/25/2015  1:03 PM  PATIENT:  Penny Hicks  64 y.o. female  PRE-OPERATIVE DIAGNOSIS:  Right hip fracture  POST-OPERATIVE DIAGNOSIS:  Right hip fracture  PROCEDURE:  Procedure(s): CANNULATED HIP PINNING (Right)  SURGEON:  Surgeon(s) and Role:    * Dorna Leitz, MD - Primary  PHYSICIAN ASSISTANT:   ASSISTANTS: bethune   ANESTHESIA:   general  EBL:  Total I/O In: 1000 [I.V.:1000] Out: 350 [Urine:350]  BLOOD ADMINISTERED:none  DRAINS: none   LOCAL MEDICATIONS USED:  NONE  SPECIMEN:  No Specimen  DISPOSITION OF SPECIMEN:  N/A  COUNTS:  YES  TOURNIQUET:  * No tourniquets in log *  DICTATION: .Other Dictation: Dictation Number [id number]  PLAN OF CARE: Admit to inpatient   PATIENT DISPOSITION:  PACU - hemodynamically stable.   Delay start of Pharmacological VTE agent (>24hrs) due to surgical blood loss or risk of bleeding: no

## 2015-05-26 ENCOUNTER — Encounter (HOSPITAL_COMMUNITY): Payer: Self-pay | Admitting: Orthopedic Surgery

## 2015-05-26 DIAGNOSIS — I10 Essential (primary) hypertension: Secondary | ICD-10-CM

## 2015-05-26 DIAGNOSIS — S72001F Fracture of unspecified part of neck of right femur, subsequent encounter for open fracture type IIIA, IIIB, or IIIC with routine healing: Secondary | ICD-10-CM

## 2015-05-26 DIAGNOSIS — F17200 Nicotine dependence, unspecified, uncomplicated: Secondary | ICD-10-CM

## 2015-05-26 LAB — BASIC METABOLIC PANEL
ANION GAP: 6 (ref 5–15)
BUN: 6 mg/dL (ref 6–20)
CALCIUM: 8.5 mg/dL — AB (ref 8.9–10.3)
CO2: 26 mmol/L (ref 22–32)
Chloride: 104 mmol/L (ref 101–111)
Creatinine, Ser: 0.67 mg/dL (ref 0.44–1.00)
GLUCOSE: 125 mg/dL — AB (ref 65–99)
Potassium: 4.2 mmol/L (ref 3.5–5.1)
SODIUM: 136 mmol/L (ref 135–145)

## 2015-05-26 LAB — URINE CULTURE: Culture: NO GROWTH

## 2015-05-26 LAB — URINE MICROSCOPIC-ADD ON

## 2015-05-26 LAB — URINALYSIS, ROUTINE W REFLEX MICROSCOPIC
BILIRUBIN URINE: NEGATIVE
Glucose, UA: NEGATIVE mg/dL
KETONES UR: NEGATIVE mg/dL
LEUKOCYTES UA: NEGATIVE
NITRITE: NEGATIVE
PROTEIN: NEGATIVE mg/dL
Specific Gravity, Urine: 1.014 (ref 1.005–1.030)
pH: 7 (ref 5.0–8.0)

## 2015-05-26 LAB — CBC
HCT: 43.3 % (ref 36.0–46.0)
HEMOGLOBIN: 13.7 g/dL (ref 12.0–15.0)
MCH: 30.4 pg (ref 26.0–34.0)
MCHC: 31.6 g/dL (ref 30.0–36.0)
MCV: 96 fL (ref 78.0–100.0)
Platelets: 212 10*3/uL (ref 150–400)
RBC: 4.51 MIL/uL (ref 3.87–5.11)
RDW: 13.1 % (ref 11.5–15.5)
WBC: 12.8 10*3/uL — ABNORMAL HIGH (ref 4.0–10.5)

## 2015-05-26 NOTE — Progress Notes (Signed)
PROGRESS NOTE  Penny Hicks [id number] DOB: 1951-02-25 DOA: 05/24/2015 PCP: [name], MD  HPI/Recap of past 6 hours: 64 year old female with past mental history of hypertension and tobacco abuse admitted on 12/12 night after mechanical fall and sustaining right femoral neck fracture. Cleared by hospitalists and patient underwent surgery on evening of 12/13 with cannulated screw fixation. No complications.  Today, patient doing well with no complaints other than soreness at site of repair.  Assessment/Plan: Principal Problem:   Hip fracture, right [facility]): Seen by physical therapy her recommending home health PT plus supervision. Watching for acute blood loss anemia plus constipation from pain medications Active Problems:   Palpitations   TOBACCO ABUSE: Counseled on nicotine patch   Essential hypertension: Blood pressure stable    Code Status: Full code   Family Communication: Husband the bedside   Disposition Plan: Likely discharge tomorrow    Consultants:  Orthopedic surgery  Procedures:  Status post cannulated screw fixation of right hip on 12/13   Antibiotics:  None    Objective: BP 120/66 mmHg  Pulse 89  Temp(Src) 98.2 F (36.8 C) (Oral)  Resp 18  Ht 5\' 6"  (1.676 m)  Wt 68.947 kg (152 lb)  BMI 24.55 kg/m2  SpO2 94%  Intake/Output Summary (Last 24 hours) at 05/26/15 1654 Last data filed at 05/26/15 1300  Gross per 24 hour  Intake    540 ml  Output   1900 ml  Net  -1360 ml   Filed Weights   05/24/15 2017  Weight: 68.947 kg (152 lb)    Exam:   General:  Alert and oriented 3, no acute distress   Cardiovascular: Regular rate and rhythm, S1 and S2   Respiratory: Clear to auscultation bilaterally   Abdomen: Soft, nontender, nondistended, positive bowel sounds   Musculoskeletal: No clubbing or cyanosis or edema    Data Reviewed: Basic Metabolic Panel:  Recent Labs Lab 05/24/15 2120 05/25/15 0420 05/25/15 1210  05/25/15 1852 05/26/15 0634  NA 139 139 139  --  136  K 3.8 3.4* 3.9 4.8 4.2  CL 105 102  --   --  104  CO2 27 29  --   --  26  GLUCOSE 139* 144* 127*  --  125*  BUN 15 10  --   --  6  CREATININE 0.75 0.78  --   --  0.67  CALCIUM 9.0 8.8*  --   --  8.5*  MG  --  1.7  --  2.1  --    Liver Function Tests: No results for input(s): AST, ALT, ALKPHOS, BILITOT, PROT, ALBUMIN in the last 168 hours. No results for input(s): LIPASE, AMYLASE in the last 168 hours. No results for input(s): AMMONIA in the last 168 hours. CBC:  Recent Labs Lab 05/24/15 2120 05/25/15 0420 05/25/15 1210 05/26/15 0634  WBC 16.0* 12.2*  --  12.8*  NEUTROABS 14.2*  --   --   --   HGB 15.2* 14.2 13.9 13.7  HCT 44.5 44.1 41.0 43.3  MCV 93.5 95.0  --  96.0  PLT 246 236  --  212   Cardiac Enzymes:   No results for input(s): CKTOTAL, CKMB, CKMBINDEX, TROPONINI in the last 168 hours. BNP (last 3 results) No results for input(s): BNP in the last 8760 hours.  ProBNP (last 3 results) No results for input(s): PROBNP in the last 8760 hours.  CBG: No results for input(s): GLUCAP in the last 168 hours.  Recent Results (from  the past 240 hour(s))  Urine culture     Status: None   Collection Time: 05/24/15 11:59 PM  Result Value Ref Range Status   Specimen Description URINE, CATHETERIZED  Final   Special Requests NONE  Final   Culture   Final    NO GROWTH 1 DAY Performed at [facility]    Report Status 05/26/2015 FINAL  Final  Surgical pcr screen     Status: None   Collection Time: 05/25/15  4:47 AM  Result Value Ref Range Status   MRSA, PCR NEGATIVE NEGATIVE Final   Staphylococcus aureus NEGATIVE NEGATIVE Final    Comment:        The Xpert SA Assay (FDA approved for NASAL specimens in patients over 50 years of age), is one component of a comprehensive surveillance program.  Test performance has been validated by [facility] for patients greater than or equal to 66 year old. It is not  intended to diagnose infection nor to guide or monitor treatment.      Studies: No results found.  Scheduled Meds: . albuterol  2.5 mg Nebulization TID  . aspirin EC  325 mg Oral BID PC  . atorvastatin  40 mg Oral Daily  . carvedilol  3.125 mg Oral BID WC  . cholecalciferol  1,000 Units Oral Daily  . docusate sodium  100 mg Oral BID  . levothyroxine  75 mcg Oral QAC breakfast  . lisinopril  10 mg Oral Daily  . omega-3 acid ethyl esters  1 g Oral BID    Continuous Infusions:    Time spent: 15 min   [facility] Pager [phone] . If 7PM-7AM, please contact night-coverage at [email or web address], password [facility] 05/26/2015, 4:54 PM  LOS: 2 days

## 2015-05-26 NOTE — Progress Notes (Signed)
PT was identified by name birth date and medical records number

## 2015-05-26 NOTE — Progress Notes (Signed)
Prn pain medicine administered po for c/o pain

## 2015-05-26 NOTE — Care Management Note (Signed)
Case Management Note  Patient Details  Name: Penny Hicks MRN: [id number] Date of Birth: 11/27/1950  Subjective/Objective:                    Action/Plan:  Confirmed face sheet information with patient . Expected Discharge Date:                  Expected Discharge Plan:  [place]  In-House Referral:     Discharge planning Services  CM Consult  Post Acute Care Choice:  Home Health, Durable Medical Equipment Choice offered to:  Patient  DME Arranged:  3-N-1, Walker rolling DME Agency:  [place]:  PT, OT [facility] Agency:  [place]  Status of Service:  Completed, signed off  Medicare Important Message Given:    Date Medicare IM Given:    Medicare IM give by:    Date Additional Medicare IM Given:    Additional Medicare Important Message give by:     If discussed at [place] of Stay Meetings, dates discussed:    Additional Comments:  Marilu Favre, RN 05/26/2015, 3:44 PM

## 2015-05-26 NOTE — Progress Notes (Signed)
Prn iv morphine administered for c/o hip pain this pm

## 2015-05-26 NOTE — Progress Notes (Signed)
Pt had episode of vtach and bigemny again this evening. MD paged

## 2015-05-26 NOTE — Progress Notes (Signed)
Pt's wedding band returned to her. It was attached to her chart

## 2015-05-26 NOTE — Progress Notes (Signed)
Pt says she left her wedding band around the operating rooms. Called pacu nurse and enquired about the ring. pacu nurse will call back when she looks in her dept

## 2015-05-26 NOTE — Progress Notes (Signed)
Pt voided 350 cc of urine

## 2015-05-26 NOTE — Clinical Social Work Note (Signed)
CSW received referral for SNF.  Case discussed with case manager, and plan is to discharge home with home health.  CSW to sign off please re-consult if social work needs arise.  Bruno Leach R. Gerardine Peltz, MSW, LCSWA [phone]  

## 2015-05-26 NOTE — Progress Notes (Signed)
MD paged again regarding sporadic episodes of vtach. Pt asymptomatic. MD called back and said we will continue to monitor

## 2015-05-26 NOTE — Evaluation (Signed)
Physical Therapy Evaluation Patient Details Name: Penny Hicks MRN: [id number] DOB: [birth year] Today's Date: 05/26/2015   History of Present Illness  Penny Hicks is an 64 y.o. female with hx of HTN, HLD, tobacco abuse, hx of palpitation, multinodular goiter, tripped and fell tonight. No LOC. She was evaluated in the ER with hip Xray which showed right comminuted subcapital to mid femoral neck Fx. She also was found to have leukocytosis and Hb of 14 g per dL. No evidence of infection clinically, and her CXR was clear. Her UA is pending. Pt underwent cannulated hip pinning on 05/25/15.  Clinical Impression  Pt admitted with above diagnosis. Pt currently with functional limitations due to the deficits listed below (see PT Problem List). Pt transferred bed to [facility] and then to chair with mod A, pt able to keep RLE 25% WB'ing.  Pt will benefit from skilled PT to increase their independence and safety with mobility to allow discharge to the venue listed below.       Follow Up Recommendations Home health PT;Supervision for mobility/OOB    Equipment Recommendations  Rolling walker with 5" wheels;3in1 (PT)    Recommendations for Other Services OT consult     Precautions / Restrictions Precautions Precautions: Fall Restrictions Weight Bearing Restrictions: Yes RLE Weight Bearing: Partial weight bearing RLE Partial Weight Bearing Percentage or Pounds: 25      Mobility  Bed Mobility Overal bed mobility: Needs Assistance Bed Mobility: Supine to Sit     Supine to sit: Max assist;HOB elevated     General bed mobility comments: max A to hips to pivot pt to sitting, use of rail by pt, husband present for emotional support  Transfers Overall transfer level: Needs assistance Equipment used: Rolling walker (2 wheeled) Transfers: Sit to/from [facility] Sit to Stand: Mod assist;Min assist Stand pivot transfers: Min assist       General transfer comment:  performed sit to stand and SPT 2x, mod A for power up first time but min A second time, min A to pivot with RW. vc's for safe use of RW, staying within it to keep RLE NWB  Ambulation/Gait             General Gait Details: not tolerated yet  Stairs            Wheelchair Mobility    Modified Rankin (Stroke Patients Only)       Balance Overall balance assessment: Needs assistance Sitting-balance support: Feet supported Sitting balance-Leahy Scale: Good     Standing balance support: Bilateral upper extremity supported Standing balance-Leahy Scale: Poor                               Pertinent Vitals/Pain Pain Assessment: 0-10 Pain Score: 8  Pain Location: right hip Pain Descriptors / Indicators: Aching Pain Intervention(s): Limited activity within patient's tolerance;Monitored during session;Patient requesting pain meds-RN notified  O2 sats 95% on RA    Home Living Family/patient expects to be discharged to:: Private residence Living Arrangements: Spouse/significant other;Children Available Help at Discharge: Family;Available 24 hours/day Type of Home: House Home Access: Stairs to enter Entrance Stairs-Rails: None Entrance Stairs-Number of Steps: 2 Home Layout: One level Home Equipment: None Additional Comments: husband will be with her at night, daughter and granddaughter can help as needed in the day    Prior Function Level of Independence: Independent         Comments: pt reports that  foot got caught in purse strap getting out of the car and she fell. Pt works as a Educational psychologist at [employer]        Extremity/Trunk Assessment   Upper Extremity Assessment: Defer to OT evaluation           Lower Extremity Assessment: RLE deficits/detail;LLE deficits/detail RLE Deficits / Details: ankle WFL, knee grossly 3/5, limited due to pain at hip, pt unable to lift leg against gravity due to pain LLE Deficits / Details:  WFL  Cervical / Trunk Assessment: Normal  Communication   Communication: No difficulties  Cognition Arousal/Alertness: Awake/alert Behavior During Therapy: WFL for tasks assessed/performed Overall Cognitive Status: Within Functional Limits for tasks assessed                      General Comments      Exercises General Exercises - Lower Extremity Ankle Circles/Pumps: AROM;Both;10 reps;Seated Quad Sets: AROM;Both;10 reps;Seated Gluteal Sets: AROM;10 reps;Seated      Assessment/Plan    PT Assessment Patient needs continued PT services  PT Diagnosis Difficulty walking;Abnormality of gait;Acute pain   PT Problem List Decreased strength;Decreased range of motion;Decreased activity tolerance;Decreased balance;Decreased mobility;Decreased knowledge of use of DME;Decreased knowledge of precautions;Pain  PT Treatment Interventions DME instruction;Gait training;Stair training;Functional mobility training;Therapeutic activities;Therapeutic exercise;Balance training;Patient/family education   PT Goals (Current goals can be found in the Care Plan section) Acute Rehab PT Goals Patient Stated Goal: return home PT Goal Formulation: With patient Time For Goal Achievement: 06/09/15 Potential to Achieve Goals: Good    Frequency Min 5X/week   Barriers to discharge        Co-evaluation               End of Session Equipment Utilized During Treatment: Gait belt Activity Tolerance: Patient tolerated treatment well Patient left: in chair;with call bell/phone within reach;with family/visitor present Nurse Communication: Mobility status;Other (comment) (O2 left off)         Time: 1125-1205 PT Time Calculation (min) (ACUTE ONLY): 40 min   Charges:   PT Evaluation $Initial PT Evaluation Tier I: 1 Procedure PT Treatments $Therapeutic Activity: 23-37 mins   PT G Codes:      Leighton Roach, PT  Acute Rehab Services  [place], [place] 05/26/2015, 3:05  PM

## 2015-05-26 NOTE — Progress Notes (Signed)
MD paged re pt having vtach runs and bigemny this morning.

## 2015-05-26 NOTE — Progress Notes (Signed)
Prn percocet x 2 administered for c/o hip pain. Pt educated to utilize incentive spirometry as ordered

## 2015-05-26 NOTE — Progress Notes (Signed)
2mg  iv morphine given for c/o pain

## 2015-05-27 ENCOUNTER — Inpatient Hospital Stay (HOSPITAL_COMMUNITY): Payer: 59

## 2015-05-27 DIAGNOSIS — S72001D Fracture of unspecified part of neck of right femur, subsequent encounter for closed fracture with routine healing: Secondary | ICD-10-CM

## 2015-05-27 DIAGNOSIS — I5022 Chronic systolic (congestive) heart failure: Secondary | ICD-10-CM

## 2015-05-27 LAB — CBC
HEMATOCRIT: 42.6 % (ref 36.0–46.0)
HEMOGLOBIN: 13.7 g/dL (ref 12.0–15.0)
MCH: 30.9 pg (ref 26.0–34.0)
MCHC: 32.2 g/dL (ref 30.0–36.0)
MCV: 95.9 fL (ref 78.0–100.0)
Platelets: 212 10*3/uL (ref 150–400)
RBC: 4.44 MIL/uL (ref 3.87–5.11)
RDW: 12.9 % (ref 11.5–15.5)
WBC: 9.1 10*3/uL (ref 4.0–10.5)

## 2015-05-27 LAB — BRAIN NATRIURETIC PEPTIDE: B Natriuretic Peptide: 195.9 pg/mL — ABNORMAL HIGH (ref 0.0–100.0)

## 2015-05-27 MED ORDER — LIVING BETTER WITH HEART FAILURE BOOK: Freq: Once | Status: DC

## 2015-05-27 MED ORDER — METHYLPREDNISOLONE SODIUM SUCC 40 MG IJ SOLR
40.0000 mg | Freq: Two times a day (BID) | INTRAMUSCULAR | Status: DC
  Administered 2015-05-27 – 2015-05-28 (×2): 40 mg via INTRAVENOUS
  Filled 2015-05-27 (×2): qty 1

## 2015-05-27 NOTE — Care Management Note (Signed)
Case Management Note  Patient Details  Name: Penny Hicks MRN: [id number] Date of Birth: 10-15-1950  Subjective/Objective:                    Action/Plan:   Expected Discharge Date:                  Expected Discharge Plan:  [place]  In-House Referral:     Discharge planning Services  CM Consult  Post Acute Care Choice:  Home Health, Durable Medical Equipment Choice offered to:  Patient  DME Arranged:  3-N-1, Walker rolling DME Agency:  [place]:  PT, OT, RN [facility] Agency:  [place]  Status of Service:  Completed, signed off  Medicare Important Message Given:    Date Medicare IM Given:    Medicare IM give by:    Date Additional Medicare IM Given:    Additional Medicare Important Message give by:     If discussed at [place] of Stay Meetings, dates discussed:    Additional Comments:  Marilu Favre, RN 05/27/2015, 2:17 PM

## 2015-05-27 NOTE — Progress Notes (Signed)
Subjective: 2 Days Post-Op Procedure(s) (LRB): CANNULATED HIP PINNING (Right) Patient reports pain as moderate. Hip is feeling better.  Should be ready for discharge home soon.   Objective: Vital signs in last 24 hours: Temp:  [98 F (36.7 C)] 98 F (36.7 C) (12/14 2118) Pulse Rate:  [89-94] 94 (12/14 2118) Resp:  [18-19] 19 (12/14 2118) BP: (126)/(73) 126/73 mmHg (12/14 2118) SpO2:  [92 %-96 %] 96 % (12/15 0731)  Intake/Output from previous day: 12/14 0701 - 12/15 0700 In: 360 [P.O.:360] Out: 1100 [Urine:1100] Intake/Output this shift: Total I/O In: -  Out: 350 [Urine:350]   Recent Labs  05/24/15 2120 05/25/15 0420 05/25/15 1210 05/26/15 0634 05/27/15 0830  HGB 15.2* 14.2 13.9 13.7 13.7    Recent Labs  05/26/15 0634 05/27/15 0830  WBC 12.8* 9.1  RBC 4.51 4.44  HCT 43.3 42.6  PLT 212 212    Recent Labs  05/25/15 0420 05/25/15 1210 05/25/15 1852 05/26/15 0634  NA 139 139  --  136  K 3.4* 3.9 4.8 4.2  CL 102  --   --  104  CO2 29  --   --  26  BUN 10  --   --  6  CREATININE 0.78  --   --  0.67  GLUCOSE 144* 127*  --  125*  CALCIUM 8.8*  --   --  8.5*    Recent Labs  05/24/15 2120  INR 1.07  right hip exam: Dressing clean and dry.  Calf is soft and nontender.   NV intact distally.   Assessment/Plan: 2 Days Post-Op Procedure(s) (LRB): CANNULATED HIP PINNING (Right)  Plan: Up with physical therapy 25% partial weight bearing on right lower extremity. Okay to discharge home with home health PT when passes physical therapy. Rx for Norco 5 mg in chart. Aspirin 325 mg EC coated twice daily x1 month for DVT prophylaxis. Will need followup with Dr. Berenice Primas in 2 weeks.  Decklyn Hornik G 05/27/2015, 9:37 AM

## 2015-05-27 NOTE — Progress Notes (Addendum)
RT Note: ABG obtained after X2 sticks on room air per order, only able to collect 0.5cc Arterial blood which was run on I-STAT since sample was not enough to be run on ABG machine in RT dept. results printed/awaitng results to cross over into EPIC, RT to follow, Results-PH-7.55/PC02-35/P02-109/HC03-31/BE-+9/Saturation-99%, pt. placed back on 2 lpm n/c, RT to monitor.

## 2015-05-27 NOTE — Progress Notes (Signed)
Physical Therapy Treatment Patient Details Name: ZALEAH HUETTER MRN: [id number] DOB: 03/17/1951 Today's Date: 05/27/2015    History of Present Illness KAYLAMARIE AKPAN is an 64 y.o. female with hx of HTN, HLD, tobacco abuse, hx of palpitation, multinodular goiter; Pt  tripped and fell tonight. No LOC. She was evaluated in the ER with hip Xray which showed right comminuted subcapital to mid femoral neck Fx. She al   Pt underwent cannulated hip pinning on 05/25/15.    PT Comments    Pt very unsafe with amb and fatigues rapidly; recommend w/c for home use and incr safety; pt family not present at time of PT session but reviewed all information  thoroughly with pt and issued handouts  Follow Up Recommendations  Supervision/Assistance - 24 hour;Home health PT     Equipment Recommendations  Rolling walker with 5" wheels;3in1 (PT);Wheelchair (measurements PT)    Recommendations for Other Services       Precautions / Restrictions Precautions Precautions: Fall Restrictions Weight Bearing Restrictions: Yes RLE Weight Bearing: Partial weight bearing RLE Partial Weight Bearing Percentage or Pounds: 25    Mobility  Bed Mobility Overal bed mobility: Needs Assistance Bed Mobility: Sit to Supine     Supine to sit: Min assist;HOB elevated Sit to supine: Min assist   General bed mobility comments: pt unable to lift RLE onto bed, min assist needed  Transfers Overall transfer level: Needs assistance Equipment used: Rolling walker (2 wheeled) Transfers: Sit to/from Stand Sit to Stand: Min assist         General transfer comment: repetitious cues for hand placement and RLE position adhere to [facility]  Ambulation/Gait Ambulation/Gait assistance: Min assist;Mod assist Ambulation Distance (Feet): 15 Feet Assistive device: Rolling walker (2 wheeled) Gait Pattern/deviations: Step-to pattern;Trunk flexed;Narrow base of support     General Gait Details: pt requiring assist for balance,  to advance RLE and maneuver RW; maximum multi-modal cues for sequence, PWB, posture; pt is very unsafe with amb (hopefully this is in part to her pain level increasing); incr time required   Stairs Stairs:  (recommend pt family bump her up in w/c. reviewed with pt)          Wheelchair Mobility    Modified Rankin (Stroke Patients Only)       Balance           Standing balance support: Bilateral upper extremity supported Standing balance-Leahy Scale: Poor                      Cognition Arousal/Alertness: Awake/alert Behavior During Therapy: WFL for tasks assessed/performed Overall Cognitive Status: Within Functional Limits for tasks assessed                      Exercises General Exercises - Lower Extremity Ankle Circles/Pumps: AROM;Both;10 reps;Seated Quad Sets: AROM;Both;10 reps;Seated Heel Slides: AAROM;Right;5 reps Hip ABduction/ADduction: AAROM;Right;5 reps    General Comments        Pertinent Vitals/Pain Pain Assessment: 0-10 Pain Score: 6  Pain Location: right  hip Pain Descriptors / Indicators: Grimacing;Discomfort;Sore Pain Intervention(s): Limited activity within patient's tolerance;Monitored during session;Patient requesting pain meds-RN notified;Repositioned    Home Living Family/patient expects to be discharged to:: Private residence Living Arrangements: Spouse/significant other;Children Available Help at Discharge: Family;Available 24 hours/day Type of Home: House Home Access: Stairs to enter Entrance Stairs-Rails: None Home Layout: One level Home Equipment: None Additional Comments: husband will be with her at night, daughter and granddaughter can help as  needed in the day    Prior Function Level of Independence: Independent      Comments: pt reports that foot got caught in purse strap getting out of the car and she fell. Pt works as a Educational psychologist at [employer] (current goals can now be found in the care plan  section) Acute Rehab PT Goals Patient Stated Goal: return home PT Goal Formulation: With patient Time For Goal Achievement: 06/09/15 Potential to Achieve Goals: Good Progress towards PT goals: Progressing toward goals    Frequency  Min 5X/week    PT Plan Current plan remains appropriate    Co-evaluation             End of Session   Activity Tolerance: Patient limited by fatigue;Patient limited by pain Patient left: in bed;with call bell/phone within reach     Time: 1034-1105 PT Time Calculation (min) (ACUTE ONLY): 31 min  Charges:  $Gait Training: 8-22 mins $Therapeutic Exercise: 8-22 mins                    G Codes:      Castor Gittleman 06/09/15, 12:14 PM

## 2015-05-27 NOTE — Progress Notes (Signed)
Occupational Therapy Evaluation Patient Details Name: Penny Hicks MRN: [id number] DOB: 1950/08/02 Today's Date: 05/27/2015    History of Present Illness Penny Hicks is an 64 y.o. female with hx of HTN, HLD, tobacco abuse, hx of palpitation, multinodular goiter, tripped and fell tonight. No LOC. She was evaluated in the ER with hip Xray which showed right comminuted subcapital to mid femoral neck Fx. She also was found to have leukocytosis and Hb of 14 g per dL. No evidence of infection clinically, and her CXR was clear. Her UA is pending. Pt underwent cannulated hip pinning on 05/25/15.   Clinical Impression   Patient presents to OT with decreased ADL independence and safety due to the deficits listed below. Will benefit from skilled OT to maximize independence and to facilitate a safe discharge plan. Patient did well during OT evaluation. May benefit from w/c for home use due to limited WB on RLE. Patient plans to purchase tub seat on her own. OT will follow.    Follow Up Recommendations  No OT follow up;Supervision/Assistance - 24 hour    Equipment Recommendations  3 in 1 bedside comode;Tub/shower seat -- patient to purchase on her own ; wheelchair   Recommendations for Other Services       Precautions / Restrictions Precautions Precautions: Fall Restrictions Weight Bearing Restrictions: Yes RLE Weight Bearing: Partial weight bearing RLE Partial Weight Bearing Percentage or Pounds: 25      Mobility Bed Mobility Overal bed mobility: Needs Assistance Bed Mobility: Supine to Sit;Sit to Supine     Supine to sit: Min assist;HOB elevated Sit to supine: Min assist   General bed mobility comments: educated on use of L foot under R leg to assist leg into bed  Transfers Overall transfer level: Needs assistance Equipment used: Rolling walker (2 wheeled) Transfers: Sit to/from Stand Sit to Stand: Min guard;From elevated surface         General transfer  comment: Able to maintain PWB RLE 25%    Balance                                            ADL Overall ADL's : Needs assistance/impaired Eating/Feeding: Independent;Sitting   Grooming: Set up;Sitting   Upper Body Bathing: Set up;Sitting   Lower Body Bathing: Minimal assistance;Sit to/from stand;Moderate assistance   Upper Body Dressing : Set up;Sitting   Lower Body Dressing: Moderate assistance;Sit to/from stand   Toilet Transfer: Min guard;Stand-pivot;RW;BSC   Toileting- Water quality scientist and Hygiene: Supervision/safety;Sit to/from stand       Functional mobility during ADLs: Min guard;Rolling walker General ADL Comments: Patient educated on WB status and implications for ADL. Discussed tub seat vs tub bench and method of transfer with patient (sit first, swing legs in) due to [facility] status. She verbalized understanding. Plans to purchase a tub seat on her own. Patient practiced BSC transfer and toileting, no difficulties and patient able to maintain PWB status RLE during mobility. Patient did have difficulty reaching R lower leg/foot but reports family can assist as needed. Educated her on hooking L foot under R leg to assist for bed mobility. Patient's HR erratic during session, varying from 57-156. Pulse ox alarming throughout session. O2 sats 85-93% on room air. Patient returned to bed at end of session.     Vision     Perception     Praxis  Pertinent Vitals/Pain Pain Assessment: 0-10 Pain Score: 5  Pain Location: right hip Pain Descriptors / Indicators: Operative site guarding;Sore Pain Intervention(s): Limited activity within patient's tolerance;Monitored during session;Repositioned     Hand Dominance Right   Extremity/Trunk Assessment Upper Extremity Assessment Upper Extremity Assessment: Overall WFL for tasks assessed   Lower Extremity Assessment Lower Extremity Assessment: Defer to PT evaluation   Cervical / Trunk  Assessment Cervical / Trunk Assessment: Normal   Communication Communication Communication: No difficulties   Cognition Arousal/Alertness: Awake/alert Behavior During Therapy: WFL for tasks assessed/performed Overall Cognitive Status: Within Functional Limits for tasks assessed                     General Comments       Exercises       Shoulder Instructions      Home Living Family/patient expects to be discharged to:: Private residence Living Arrangements: Spouse/significant other;Children Available Help at Discharge: Family;Available 24 hours/day Type of Home: House Home Access: Stairs to enter [employer] of Steps: 2 Entrance Stairs-Rails: None Home Layout: One level     Bathroom Shower/Tub: Tub/shower unit Shower/tub characteristics: Architectural technologist: Standard Bathroom Accessibility: Yes How Accessible: Accessible via walker Home Equipment: None   Additional Comments: husband will be with her at night, daughter and granddaughter can help as needed in the day      Prior Functioning/Environment Level of Independence: Independent        Comments: pt reports that foot got caught in purse strap getting out of the car and she fell. Pt works as a Educational psychologist at [employer] Diagnosis: Acute pain   OT Problem List: Decreased strength;Decreased activity tolerance;Decreased knowledge of use of DME or AE;Decreased knowledge of precautions;Pain   OT Treatment/Interventions: Self-care/ADL training;DME and/or AE instruction;Therapeutic activities;Patient/family education    OT Goals(Current goals can be found in the care plan section) Acute Rehab OT Goals Patient Stated Goal: return home OT Goal Formulation: With patient Time For Goal Achievement: 06/10/15 Potential to Achieve Goals: Good ADL Goals Pt Will Perform Lower Body Bathing: with supervision;sit to/from stand Pt Will Perform Lower Body Dressing: with supervision;sit to/from  stand Pt Will Transfer to Toilet: with supervision;ambulating;bedside commode Pt Will Perform Toileting - Clothing Manipulation and hygiene: with supervision;sit to/from stand Pt Will Perform Tub/Shower Transfer: with supervision;ambulating;shower seat;rolling walker;Tub transfer  OT Frequency: Min 2X/week   Barriers to D/C:            Co-evaluation              End of Session Equipment Utilized During Treatment: Rolling walker  Activity Tolerance: Patient tolerated treatment well Patient left: in bed;with call bell/phone within reach   Time: 0911-0937 OT Time Calculation (min): 26 min Charges:  OT General Charges $OT Visit: 1 Procedure OT Evaluation $Initial OT Evaluation Tier I: 1 Procedure OT Treatments $Self Care/Home Management : 8-22 mins G-Codes:    Phillippa Straub A June 14, 2015, 10:09 AM

## 2015-05-27 NOTE — Progress Notes (Signed)
PROGRESS NOTE  Penny Hicks [id number] DOB: [birth year] DOA: 05/24/2015 PCP: [name], MD  HPI/Recap of past 38 hours: 64 year old female with past mental history of hypertension and tobacco abuse admitted on 12/12 night after mechanical fall and sustaining right femoral neck fracture.  Cleared by hospitalists and patient underwent surgery on evening of 12/13 with cannulated screw fixation. No complications. Prior to surgery, cardiomegaly noted on chest x-ray, so echocardiogram done which noted mild systolic heart failure with ejection fraction of 40%.  Today, postop day 2, patient complaining of some cough and mild dyspnea on exertion. Exam noted for wheezing.  Assessment/Plan: Principal Problem:   Hip fracture, right [facility]): Seen by physical therapy her recommending home health PT plus supervision. Watching for acute blood loss anemia plus constipation from pain medications Active Problems:   Palpitations   TOBACCO ABUSE: Counseled on nicotine patch   Essential hypertension: Blood pressure stable Chronic systolic heart failure: Dyspnea symptoms may be acute heart failure. Chest x-ray unrevealing. Awaiting BNP. If normal, may treat this as COPD exacerbation as patient has heavy tobacco history. Already on ACE inhibitor and low-dose beta blocker added.   Code Status: Full code   Family Communication: Daughter at the bedside  Disposition Plan: Despite symptoms, patient overall stable. Anticipate discharge in the next 1-2 days   Consultants:  Orthopedic surgery  Procedures:  Status post cannulated screw fixation of right hip on 12/13   Antibiotics:  None    Objective: BP 126/73 mmHg  Pulse 94  Temp(Src) 98 F (36.7 C) (Oral)  Resp 19  Ht 5\' 6"  (1.676 m)  Wt 68.947 kg (152 lb)  BMI 24.55 kg/m2  SpO2 96%  Intake/Output Summary (Last 24 hours) at 05/27/15 1722 Last data filed at 05/27/15 1513  Gross per 24 hour  Intake    750 ml  Output   1100 ml    Net   -350 ml   Filed Weights   05/24/15 2017  Weight: 68.947 kg (152 lb)    Exam:   General:  Alert and oriented 3, no acute distress   Cardiovascular: Regular rate and rhythm, S1 and S2   Respiratory: Bilateral expiratory wheeze  Abdomen: Soft, nontender, nondistended, positive bowel sounds   Musculoskeletal: No clubbing or cyanosis or edema    Data Reviewed: Basic Metabolic Panel:  Recent Labs Lab 05/24/15 2120 05/25/15 0420 05/25/15 1210 05/25/15 1852 05/26/15 0634  NA 139 139 139  --  136  K 3.8 3.4* 3.9 4.8 4.2  CL 105 102  --   --  104  CO2 27 29  --   --  26  GLUCOSE 139* 144* 127*  --  125*  BUN 15 10  --   --  6  CREATININE 0.75 0.78  --   --  0.67  CALCIUM 9.0 8.8*  --   --  8.5*  MG  --  1.7  --  2.1  --    Liver Function Tests: No results for input(s): AST, ALT, ALKPHOS, BILITOT, PROT, ALBUMIN in the last 168 hours. No results for input(s): LIPASE, AMYLASE in the last 168 hours. No results for input(s): AMMONIA in the last 168 hours. CBC:  Recent Labs Lab 05/24/15 2120 05/25/15 0420 05/25/15 1210 05/26/15 0634 05/27/15 0830  WBC 16.0* 12.2*  --  12.8* 9.1  NEUTROABS 14.2*  --   --   --   --   HGB 15.2* 14.2 13.9 13.7 13.7  HCT 44.5 44.1 41.0 43.3  42.6  MCV 93.5 95.0  --  96.0 95.9  PLT 246 236  --  212 212   Cardiac Enzymes:   No results for input(s): CKTOTAL, CKMB, CKMBINDEX, TROPONINI in the last 168 hours. BNP (last 3 results) No results for input(s): BNP in the last 8760 hours.  ProBNP (last 3 results) No results for input(s): PROBNP in the last 8760 hours.  CBG: No results for input(s): GLUCAP in the last 168 hours.  Recent Results (from the past 240 hour(s))  Urine culture     Status: None   Collection Time: 05/24/15 11:59 PM  Result Value Ref Range Status   Specimen Description URINE, CATHETERIZED  Final   Special Requests NONE  Final   Culture   Final    NO GROWTH 1 DAY Performed at [facility]    Report  Status 05/26/2015 FINAL  Final  Surgical pcr screen     Status: None   Collection Time: 05/25/15  4:47 AM  Result Value Ref Range Status   MRSA, PCR NEGATIVE NEGATIVE Final   Staphylococcus aureus NEGATIVE NEGATIVE Final    Comment:        The Xpert SA Assay (FDA approved for NASAL specimens in patients over 47 years of age), is one component of a comprehensive surveillance program.  Test performance has been validated by [facility] for patients greater than or equal to 58 year old. It is not intended to diagnose infection nor to guide or monitor treatment.      Studies: Dg Chest 2 View  05/27/2015  CLINICAL DATA:  Shortness of breath and cough for 2 days. Two days postop from internal fixation of right hip fracture. EXAM: CHEST  2 VIEW COMPARISON:  05/24/2015 FINDINGS: Mild to moderate cardiomegaly remains stable. Both lungs remain clear. No evidence of pulmonary infiltrate or edema. No evidence of pneumothorax or pleural effusion. IMPRESSION: Stable cardiomegaly.  No active lung disease Electronically Signed   By: Earle Gell M.D.   On: 05/27/2015 15:25    Scheduled Meds: . albuterol  2.5 mg Nebulization TID  . aspirin EC  325 mg Oral BID PC  . atorvastatin  40 mg Oral Daily  . carvedilol  3.125 mg Oral BID WC  . cholecalciferol  1,000 Units Oral Daily  . docusate sodium  100 mg Oral BID  . levothyroxine  75 mcg Oral QAC breakfast  . lisinopril  10 mg Oral Daily  . Living Better with Heart Failure Book   Does not apply Once  . omega-3 acid ethyl esters  1 g Oral BID    Continuous Infusions:    Time spent: 35 min   [facility] Pager [phone] . If 7PM-7AM, please contact night-coverage at [email or web address], password [facility] 05/27/2015, 5:22 PM  LOS: 3 days

## 2015-05-28 DIAGNOSIS — I509 Heart failure, unspecified: Secondary | ICD-10-CM | POA: Insufficient documentation

## 2015-05-28 LAB — CBC
HCT: 41.4 % (ref 36.0–46.0)
HEMOGLOBIN: 13.7 g/dL (ref 12.0–15.0)
MCH: 31.4 pg (ref 26.0–34.0)
MCHC: 33.1 g/dL (ref 30.0–36.0)
MCV: 94.7 fL (ref 78.0–100.0)
PLATELETS: 230 10*3/uL (ref 150–400)
RBC: 4.37 MIL/uL (ref 3.87–5.11)
RDW: 12.7 % (ref 11.5–15.5)
WBC: 9.6 10*3/uL (ref 4.0–10.5)

## 2015-05-28 LAB — POCT I-STAT 3, ART BLOOD GAS (G3+)
ACID-BASE EXCESS: 9 mmol/L — AB (ref 0.0–2.0)
Bicarbonate: 31.2 mEq/L — ABNORMAL HIGH (ref 20.0–24.0)
O2 SAT: 99 %
PH ART: 7.557 — AB (ref 7.350–7.450)
TCO2: 32 mmol/L (ref 0–100)
pCO2 arterial: 35 mmHg (ref 35.0–45.0)
pO2, Arterial: 107 mmHg — ABNORMAL HIGH (ref 80.0–100.0)

## 2015-05-28 MED ORDER — ALBUTEROL SULFATE HFA 108 (90 BASE) MCG/ACT IN AERS: 2.0000 | INHALATION_SPRAY | Freq: Four times a day (QID) | RESPIRATORY_TRACT | Status: DC | PRN

## 2015-05-28 MED ORDER — CARVEDILOL 3.125 MG PO TABS: 3.1250 mg | ORAL_TABLET | Freq: Two times a day (BID) | ORAL | Status: DC

## 2015-05-28 MED ORDER — OMEPRAZOLE MAGNESIUM 20 MG PO TBEC: 20.0000 mg | DELAYED_RELEASE_TABLET | Freq: Every day | ORAL | Status: DC

## 2015-05-28 NOTE — Progress Notes (Signed)
Ambulating Oxygen Saturation 95% with Physical Therapy. 

## 2015-05-28 NOTE — Progress Notes (Signed)
PT Cancellation Note  Patient Details Name: Penny Hicks MRN: [id number] DOB: 1951/04/20   Cancelled Treatment:    Reason Eval/Treat Not Completed: Other (comment) Attempted to come do stair training with spouse present however he is not here yet and pt reports he will not be here for another hour.   Marguarite Arbour A Ethel Meisenheimer 05/28/2015, 3:26 PM  Wray Kearns, [place], DPT [phone]

## 2015-05-28 NOTE — Progress Notes (Signed)
AVS given to patient.  Dressing supplies given to patient for incision with staples.  Discharge instructions understood verbally.  Both IVs removed.  Belongings packed. Transportation arranged by patient.

## 2015-05-28 NOTE — Progress Notes (Signed)
Physical Therapy Treatment Patient Details Name: Penny Hicks MRN: [id number] DOB: 06-Sep-1950 Today's Date: 05/28/2015    History of Present Illness Penny Hicks is an 64 y.o. female with hx of HTN, HLD, tobacco abuse, hx of palpitation, multinodular goiter; Pt  tripped and fell tonight. No LOC. She was evaluated in the ER with hip Xray which showed right comminuted subcapital to mid femoral neck Fx. She al   Pt underwent cannulated hip pinning on 05/25/15.    PT Comments    Pt progressing, incr amb distance and compliance with 25% PWB today;  needs to practice stairs (discussed up/down 2 steps posterior technique using RW) when husband is present later this pm; RN made aware; Pt HR 106-114, sats 93-96% on RA throughout session   Follow Up Recommendations  Home health PT;Supervision for mobility/OOB     Equipment Recommendations  Rolling walker with 5" wheels;3in1 (PT) (pt to borrow w/c)    Recommendations for Other Services       Precautions / Restrictions Precautions Precautions: Fall Restrictions Weight Bearing Restrictions: Yes RLE Weight Bearing: Partial weight bearing RLE Partial Weight Bearing Percentage or Pounds: 25 (pt reports PA told her she could be 50% PWb but no orders)    Mobility  Bed Mobility               General bed mobility comments: pt on EOB  Transfers Overall transfer level: Needs assistance Equipment used: Rolling walker (2 wheeled) Transfers: Sit to/from Stand Sit to Stand: Min guard         General transfer comment: sit to stand x 3 from varied ht surfaces; verbal cues for hand placement  with pt demonstrating carryover of techniques at end of session  Ambulation/Gait Ambulation/Gait assistance: Min guard;Min assist Ambulation Distance (Feet): 50 Feet Assistive device: Rolling walker (2 wheeled) Gait Pattern/deviations: Step-to pattern;Trunk flexed (RLE in external rotation)   Gait velocity interpretation: Below normal  speed for age/gender General Gait Details: verbal cues for sequence, 25%PWB, posture and RW distance from self; pt beginning to self cue end of session   Stairs Stairs:  (discussed up/down 2 stairs with RW and husband assist)          Wheelchair Mobility    Modified Rankin (Stroke Patients Only)       Balance           Standing balance support: No upper extremity supported;During functional activity Standing balance-Leahy Scale: Fair (very close guarding for safety)                      Cognition Arousal/Alertness: Awake/alert Behavior During Therapy: WFL for tasks assessed/performed Overall Cognitive Status: Within Functional Limits for tasks assessed                      Exercises General Exercises - Lower Extremity Ankle Circles/Pumps: AROM;Both;10 reps;Seated Quad Sets: Both;10 reps;Strengthening Long Arc Quad: Strengthening;Right;10 reps;Seated Heel Slides: Right;Strengthening;10 reps Hip ABduction/ADduction: AROM;AAROM;Right;10 reps    General Comments        Pertinent Vitals/Pain Pain Assessment: 0-10 Pain Score: 3  Pain Location: right hip Pain Descriptors / Indicators: Sore Pain Intervention(s): Limited activity within patient's tolerance;Monitored during session;Premedicated before session;Repositioned    Home Living                      Prior Function            PT Goals (current goals can now be  found in the care plan section) Acute Rehab PT Goals Patient Stated Goal: return home PT Goal Formulation: With patient Time For Goal Achievement: 06/09/15 Potential to Achieve Goals: Good Progress towards PT goals: Progressing toward goals    Frequency  Min 5X/week    PT Plan Current plan remains appropriate    Co-evaluation             End of Session Equipment Utilized During Treatment: Gait belt Activity Tolerance: Patient tolerated treatment well Patient left: in chair;with call bell/phone within  reach     Time: 1025-1052 PT Time Calculation (min) (ACUTE ONLY): 27 min  Charges:  $Gait Training: 8-22 mins $Therapeutic Exercise: 8-22 mins                    G Codes:      Penny Hicks 2015/06/19, 11:00 AM

## 2015-05-28 NOTE — Progress Notes (Signed)
Subjective: 3 Days Post-Op Procedure(s) (LRB): CANNULATED HIP PINNING (Right) Patient reports pain as moderate.  Progressing with physical therapy.  Objective: Vital signs in last 24 hours: Temp:  [98 F (36.7 C)-98.1 F (36.7 C)] 98.1 F (36.7 C) (12/16 0519) Pulse Rate:  [82-86] 86 (12/16 0519) Resp:  [18] 18 (12/16 0519) BP: (123-139)/(71-78) 123/78 mmHg (12/16 0519) SpO2:  [93 %-100 %] 100 % (12/16 0751) Weight:  [72.122 kg (159 lb)-72.666 kg (160 lb 3.2 oz)] 72.122 kg (159 lb) (12/16 0519)  Intake/Output from previous day: 12/15 0701 - 12/16 0700 In: 980 [P.O.:980] Out: 950 [Urine:950] Intake/Output this shift: Total I/O In: 9165.4 [I.V.:8760.4; IV Piggyback:405] Out: -    Recent Labs  05/26/15 0634 05/27/15 0830 05/28/15 0438  HGB 13.7 13.7 13.7    Recent Labs  05/27/15 0830 05/28/15 0438  WBC 9.1 9.6  RBC 4.44 4.37  HCT 42.6 41.4  PLT 212 230    Recent Labs  05/25/15 1852 05/26/15 0634  NA  --  136  K 4.8 4.2  CL  --  104  CO2  --  26  BUN  --  6  CREATININE  --  0.67  GLUCOSE  --  125*  CALCIUM  --  8.5*   No results for input(s): LABPT, INR in the last 72 hours. Right hip exam: Lateral hip dressing is clean and dry.  Minimal pain with range of motion of her right hip.  Right calf is soft and nontender.  NV intact to the right lower extremity.  Assessment/Plan: 3 Days Post-Op Procedure(s) (LRB): CANNULATED HIP PINNING (Right)  Plan: Okay from orthopedic viewpoint for discharge home with home health PT.  She will be 25% weightbearing on the right lower extremity.  If she increases up to 50% that is okay with a walker. Aspirin 325 mg twice daily with food for DVT prophylaxis x1 month postop. Followup with Dr. Berenice Primas in 2 weeks.   [place] G 05/28/2015, 3:32 PM

## 2015-05-28 NOTE — Discharge Summary (Signed)
Discharge Summary  Penny Hicks [id number] DOB: June 26, 1950  PCP: [name], MD  Admit date: 05/24/2015 Discharge date: 05/28/2015  Time spent: 25 minutes  Recommendations for Outpatient Follow-up:  1. New medication: Coreg 3.125 mg by mouth twice a day 2. New medication: Albuterol inhaler 2 puffs 4 times a day as needed 3. New medication: Vicodin when necessary 4. New medication: Prilosec 20 mg over-the-counter by mouth daily for the next 30 days 5. New medication: Aspirin 325 by mouth twice a day 30 days   Discharge Diagnoses:  Active Hospital Problems   Diagnosis Date Noted  . Hip fracture, right ([place]) 05/24/2015  . Chronic systolic heart failure ([place]) 05/27/2015  . Hip fracture ([place]) 05/24/2015  . Essential hypertension 07/05/2010  . TOBACCO ABUSE 07/05/2010  . Palpitations 06/21/2010    Resolved Hospital Problems   Diagnosis Date Noted Date Resolved  No resolved problems to display.    Discharge Condition: Improved, being discharged home with home physical therapy  Diet recommendation: Heart healthy   Filed Weights   05/24/15 2017 05/27/15 1846 05/28/15 0519  Weight: 68.947 kg (152 lb) 72.666 kg (160 lb 3.2 oz) 72.122 kg (159 lb)    History of present illness:  64 year old female with past history of hypertension and long-time tobacco abuse admitted on 12/12 night after mechanical fall and sustaining a right femoral neck fracture.   Hospital Course:  Principal Problem:   Hip fracture, right (HCC):Patient was cleared for surgery and underwent hip repair by orthopedists on the evening of 12/13 with cannulated screw fixation.  Patient responded well post surgery. Pain managed with oral pain medications. Start on aspirin 325 by mouth twice a day for DVT prophylaxis. Patient will follow-up with Dr. Dorna Leitz, orthopedist in 2 weeks. Seen by physical therapy felt she would be okay to go home with home health allowing 25% weightbearing on right lower  extremity, up to 50% with a walker. While on aspirin, patient started on PPI for GI ulcer prophylaxis  Active Problems:    TOBACCO ABUSE: Counseled to quit. See below   Essential hypertension: As below. Continue ACE inhibitor. Coreg added.     Chronic systolic heart failure (HCC):Prior to surgery, cardiomegaly was noted on chest x-ray, so echocardiogram done with noted mild systolic heart failure and an ejection fraction of 40%. New finding for patient. On postop day 2, patient noted be slightly dyspneic and wheezing. Oxygen saturation is above 90%. Lung exam clear and repeat chest x-ray noted no evidence of edema. BNP normal. Patient not felt to be in acute heart failure. Patient already on ACE inhibitor, started on Coreg which she given prescription for. Given information about CHF. Patient will follow-up with her PCP  Dyspnea: Once heart failure ruled out, patient treated with IV steroids and nebulizers. I suspect that she may have undiagnosed COPD given her long smoking history. Patient quickly rallied and by 12/16, breathing comfortably. Discharged home with when necessary inhaler and counseled to quit or at least decrease smoking  Procedures:  Status post cannulated hip pinning done 12/12   Consultations:  Orthopedic surgery   Discharge Exam: BP 130/71 mmHg  Pulse 82  Temp(Src) 98.5 F (36.9 C) (Oral)  Resp 18  Ht 5\' 6"  (1.676 m)  Wt 72.122 kg (159 lb)  BMI 25.68 kg/m2  SpO2 100%  General: Alert and oriented 3  Cardiovascular: Regular rate and rhythm, S1-S2  Respiratory: Decreased breath sounds throughout   Discharge Instructions You were cared for by a hospitalist  during your hospital stay. If you have any questions about your discharge medications or the care you received while you were in the hospital after you are discharged, you can call the unit and asked to speak with the hospitalist on call if the hospitalist that took care of you is not available. Once you are  discharged, your primary care physician will handle any further medical issues. Please note that NO REFILLS for any discharge medications will be authorized once you are discharged, as it is imperative that you return to your primary care physician (or establish a relationship with a primary care physician if you do not have one) for your aftercare needs so that they can reassess your need for medications and monitor your lab values.  Discharge Instructions    Diet - low sodium heart healthy    Complete by:  As directed      Increase activity slowly    Complete by:  As directed      Partial weight bearing    Complete by:  As directed   % Body Weight:  25%  Laterality:  right  Extremity:  Lower            Medication List    TAKE these medications        albuterol 108 (90 BASE) MCG/ACT inhaler  Commonly known as:  PROVENTIL HFA;VENTOLIN HFA  Inhale 2 puffs into the lungs every 6 (six) hours as needed for wheezing or shortness of breath.     aspirin EC 325 MG tablet  Take 1 tablet (325 mg total) by mouth 2 (two) times daily after a meal. Take x 1 month post op to decrease risk of blood clots.     atorvastatin 40 MG tablet  Commonly known as:  LIPITOR  Take 40 mg by mouth daily.     carvedilol 3.125 MG tablet  Commonly known as:  COREG  Take 1 tablet (3.125 mg total) by mouth 2 (two) times daily with a meal.     cholecalciferol 1000 UNITS tablet  Commonly known as:  VITAMIN D  Take 1,000 Units by mouth daily.     ESTROVEN PO  Take 1 tablet by mouth every evening.     Fish Oil 1000 MG Caps  Take 1,000 mg by mouth daily.     HYDROcodone-acetaminophen 5-325 MG tablet  Commonly known as:  NORCO  Take 1-2 tablets by mouth every 6 (six) hours as needed for moderate pain.     levothyroxine 75 MCG tablet  Commonly known as:  SYNTHROID, LEVOTHROID  Take 75 mcg by mouth daily before breakfast.     lisinopril 20 MG tablet  Commonly known as:  PRINIVIL,ZESTRIL  Take 20 mg by  mouth daily.     omeprazole 20 MG tablet  Commonly known as:  PRILOSEC OTC  Take 1 tablet (20 mg total) by mouth daily.     Potassium Gluconate 595 MG Caps  Take 1 capsule by mouth daily.     traMADol 50 MG tablet  Commonly known as:  ULTRAM  Take 50 mg by mouth 4 (four) times daily as needed for moderate pain or severe pain.     vitamin E 400 UNIT capsule  Take 400 Units by mouth daily.     zolpidem 10 MG tablet  Commonly known as:  AMBIEN  Take 10 mg by mouth at bedtime as needed for sleep.       No Known Allergies     Follow-up Information  Follow up with GRAVES,JOHN L, MD. Schedule an appointment as soon as possible for a visit in 2 weeks.   Specialty:  Orthopedic Surgery   Contact information:   [place] [id number] [phone]        The results of significant diagnostics from this hospitalization (including imaging, microbiology, ancillary and laboratory) are listed below for reference.    Significant Diagnostic Studies: Dg Chest 1 View  05/24/2015  CLINICAL DATA:  Fall out of car.  Initial encounter. EXAM: CHEST 1 VIEW COMPARISON:  None. FINDINGS: There is no evidence of pulmonary edema, consolidation, pneumothorax, nodule or pleural fluid. The heart is mildly enlarged. Visualized bony thorax is unremarkable. IMPRESSION: No active disease.  Mild cardiomegaly. Electronically Signed   By: Aletta Edouard M.D.   On: 05/24/2015 21:51   Dg Chest 2 View  05/27/2015  CLINICAL DATA:  Shortness of breath and cough for 2 days. Two days postop from internal fixation of right hip fracture. EXAM: CHEST  2 VIEW COMPARISON:  05/24/2015 FINDINGS: Mild to moderate cardiomegaly remains stable. Both lungs remain clear. No evidence of pulmonary infiltrate or edema. No evidence of pneumothorax or pleural effusion. IMPRESSION: Stable cardiomegaly.  No active lung disease Electronically Signed   By: Earle Gell M.D.   On: 05/27/2015 15:25   Dg Hip Operative Unilat  With Pelvis Right  05/25/2015  CLINICAL DATA:  Cannulated hip pinning EXAM: OPERATIVE RIGHT HIP (WITH PELVIS IF PERFORMED) 2 VIEWS TECHNIQUE: Fluoroscopic spot image(s) were submitted for interpretation post-operatively. COMPARISON:  05/24/2015 FINDINGS: If the device does not provide the exposure index: Fluoroscopy Time:  1 minutes 20 second Number of Acquired Images:  2 Three cannulated screws placed through the right femoral neck extending into the femoral head. IMPRESSION: ORIF right subcapital femur fracture Electronically Signed   By: Skipper Cliche M.D.   On: 05/25/2015 13:24   Dg Hip Unilat With Pelvis 2-3 Views Right  05/24/2015  CLINICAL DATA:  Initial encounter for Pt brought in by [facility] for c/o fall; pt was trying to get out of a car and her right foot got caught in car and pt fell out of car seat landing on her right hip; pt is c/o increasing pain since it happened; no abduction noted; positive pulses EXAM: DG HIP (WITH OR WITHOUT PELVIS) 2-3V RIGHT COMPARISON:  None. FINDINGS: Femoral heads are located. Sacroiliac joints are symmetric. Impaction fracture with probable comminution within the subcapital to mid femoral neck. IMPRESSION: Right femoral neck fracture. Electronically Signed   By: Abigail Miyamoto M.D.   On: 05/24/2015 21:54    Microbiology: Recent Results (from the past 240 hour(s))  Urine culture     Status: None   Collection Time: 05/24/15 11:59 PM  Result Value Ref Range Status   Specimen Description URINE, CATHETERIZED  Final   Special Requests NONE  Final   Culture   Final    NO GROWTH 1 DAY Performed at [facility]    Report Status 05/26/2015 FINAL  Final  Surgical pcr screen     Status: None   Collection Time: 05/25/15  4:47 AM  Result Value Ref Range Status   MRSA, PCR NEGATIVE NEGATIVE Final   Staphylococcus aureus NEGATIVE NEGATIVE Final    Comment:        The Xpert SA Assay (FDA approved for NASAL specimens in patients over 51 years of age), is  one component of a comprehensive surveillance program.  Test performance has been validated by [facility] for patients greater than or equal to 65 year old. It is not intended to diagnose infection nor to guide or monitor treatment.      Labs: Basic Metabolic Panel:  Recent Labs Lab 05/24/15 2120 05/25/15 0420 05/25/15 1210 05/25/15 1852 05/26/15 0634  NA 139 139 139  --  136  K 3.8 3.4* 3.9 4.8 4.2  CL 105 102  --   --  104  CO2 27 29  --   --  26  GLUCOSE 139* 144* 127*  --  125*  BUN 15 10  --   --  6  CREATININE 0.75 0.78  --   --  0.67  CALCIUM 9.0 8.8*  --   --  8.5*  MG  --  1.7  --  2.1  --    Liver Function Tests: No results for input(s): AST, ALT, ALKPHOS, BILITOT, PROT, ALBUMIN in the last 168 hours. No results for input(s): LIPASE, AMYLASE in the last 168 hours. No results for input(s): AMMONIA in the last 168 hours. CBC:  Recent Labs Lab 05/24/15 2120 05/25/15 0420 05/25/15 1210 05/26/15 0634 05/27/15 0830 05/28/15 0438  WBC 16.0* 12.2*  --  12.8* 9.1 9.6  NEUTROABS 14.2*  --   --   --   --   --   HGB 15.2* 14.2 13.9 13.7 13.7 13.7  HCT 44.5 44.1 41.0 43.3 42.6 41.4  MCV 93.5 95.0  --  96.0 95.9 94.7  PLT 246 236  --  212 212 230   Cardiac Enzymes: No results for input(s): CKTOTAL, CKMB, CKMBINDEX, TROPONINI in the last 168 hours. BNP: BNP (last 3 results)  Recent Labs  05/27/15 1640  BNP 195.9*    ProBNP (last 3 results) No results for input(s): PROBNP in the last 8760 hours.  CBG: No results for input(s): GLUCAP in the last 168 hours.     Signed:  Annita Brod  [facility] 05/28/2015, 4:46 PM

## 2017-04-23 ENCOUNTER — Ambulatory Visit (HOSPITAL_COMMUNITY)
Admission: RE | Admit: 2017-04-23 | Discharge: 2017-04-23 | Disposition: A | Discharge status: Home / Self Care | Payer: 59 | Source: Ambulatory Visit | Attending: Family Medicine | Admitting: Family Medicine

## 2017-04-23 ENCOUNTER — Other Ambulatory Visit (HOSPITAL_COMMUNITY): Payer: Self-pay | Admitting: Family Medicine

## 2017-04-23 DIAGNOSIS — R918 Other nonspecific abnormal finding of lung field: Secondary | ICD-10-CM | POA: Diagnosis not present

## 2017-04-23 DIAGNOSIS — F17200 Nicotine dependence, unspecified, uncomplicated: Secondary | ICD-10-CM | POA: Diagnosis not present

## 2017-04-23 DIAGNOSIS — J209 Acute bronchitis, unspecified: Secondary | ICD-10-CM

## 2017-04-23 DIAGNOSIS — M5184 Other intervertebral disc disorders, thoracic region: Secondary | ICD-10-CM | POA: Insufficient documentation

## 2017-04-23 DIAGNOSIS — J42 Unspecified chronic bronchitis: Secondary | ICD-10-CM | POA: Diagnosis not present

## 2017-05-17 ENCOUNTER — Other Ambulatory Visit (HOSPITAL_COMMUNITY): Payer: Self-pay | Admitting: Family Medicine

## 2017-05-17 ENCOUNTER — Ambulatory Visit (HOSPITAL_COMMUNITY)
Admission: RE | Admit: 2017-05-17 | Discharge: 2017-05-17 | Disposition: A | Discharge status: Home / Self Care | Payer: 59 | Source: Ambulatory Visit | Attending: Family Medicine | Admitting: Family Medicine

## 2017-05-17 DIAGNOSIS — J181 Lobar pneumonia, unspecified organism: Secondary | ICD-10-CM | POA: Diagnosis not present

## 2017-09-06 ENCOUNTER — Ambulatory Visit (HOSPITAL_COMMUNITY)
Admission: RE | Admit: 2017-09-06 | Discharge: 2017-09-06 | Disposition: A | Discharge status: Home / Self Care | Payer: 59 | Source: Ambulatory Visit | Attending: Physician Assistant | Admitting: Physician Assistant

## 2017-09-06 ENCOUNTER — Other Ambulatory Visit (HOSPITAL_COMMUNITY): Payer: Self-pay | Admitting: Physician Assistant

## 2017-09-06 DIAGNOSIS — J9811 Atelectasis: Secondary | ICD-10-CM | POA: Diagnosis not present

## 2017-09-06 DIAGNOSIS — I517 Cardiomegaly: Secondary | ICD-10-CM | POA: Diagnosis not present

## 2017-09-06 DIAGNOSIS — J42 Unspecified chronic bronchitis: Secondary | ICD-10-CM | POA: Diagnosis not present

## 2017-09-10 ENCOUNTER — Encounter: Payer: Self-pay | Admitting: Gastroenterology

## 2017-10-04 ENCOUNTER — Other Ambulatory Visit: Payer: Self-pay | Admitting: Internal Medicine

## 2017-10-04 DIAGNOSIS — E042 Nontoxic multinodular goiter: Secondary | ICD-10-CM

## 2017-11-06 ENCOUNTER — Other Ambulatory Visit: Payer: 59

## 2017-11-12 ENCOUNTER — Encounter: Payer: Self-pay | Admitting: Gastroenterology

## 2017-11-12 ENCOUNTER — Ambulatory Visit (INDEPENDENT_AMBULATORY_CARE_PROVIDER_SITE_OTHER): Payer: 59 | Admitting: Gastroenterology

## 2017-11-12 VITALS — BP 158/100 | HR 92 | Ht 65.35 in | Wt 164.1 lb

## 2017-11-12 DIAGNOSIS — K921 Melena: Secondary | ICD-10-CM | POA: Diagnosis not present

## 2017-11-12 DIAGNOSIS — K5900 Constipation, unspecified: Secondary | ICD-10-CM | POA: Diagnosis not present

## 2017-11-12 DIAGNOSIS — K649 Unspecified hemorrhoids: Secondary | ICD-10-CM | POA: Diagnosis not present

## 2017-11-12 MED ORDER — PEG 3350-KCL-NA BICARB-NACL 420 G PO SOLR: 4000.0000 mL | ORAL | 0 refills | Status: DC

## 2017-11-12 NOTE — Patient Instructions (Addendum)
You will be set up for a colonoscopy for hemocult positive stool. Please start taking citrucel (orange flavored) powder fiber supplement.  This may cause some bloating at first but that usually goes away. Begin with a small spoonful and work your way up to a large, heaping spoonful daily over a week.  Normal BMI (Body Mass Index- based on height and weight) is between 23 and 30. Your BMI today is Body mass index is 27.02 kg/m. Marland Kitchen Please consider follow up  regarding your BMI with your Primary Care Provider.

## 2017-11-12 NOTE — Progress Notes (Signed)
HPI: This is a very pleasant 67 year old woman who was referred by Mittie Bodo to discuss Hemoccult positive stool and hemorrhoids  Chief complaint is Hemoccult positive stool and hemorrhoids  Feels external hemorrhoids.  She has had these since the birth of her first child 66 years ago.  She has very mild intermittent constipation, seems to be worse when she travels.  She has never seen overt bleeding.  The external hemorrhoids can be a bother to her sometimes with slight discomfort especially if she has had a difficult to move stool.  Other than that she has no issues with her bowels.  Her weight has been overall stable.  Old Data Reviewed: None available    Review of systems: Pertinent positive and negative review of systems were noted in the above HPI section. All other review negative.   Past Medical History:  Diagnosis Date  . Arthritis   . COPD (chronic obstructive pulmonary disease) ([place])   . Hypercholesterolemia   . Hypertension   . Hypothyroidism     Past Surgical History:  Procedure Laterality Date  . CESAREAN SECTION  x 2  . HIP PINNING,CANNULATED Right 05/25/2015   Procedure: CANNULATED HIP PINNING;  Surgeon: Dorna Leitz, MD;  Location: [facility];  Service: Orthopedics;  Laterality: Right;  . TONSILLECTOMY      Current Outpatient Medications  Medication Sig Dispense Refill  . albuterol (PROVENTIL HFA;VENTOLIN HFA) 108 (90 BASE) MCG/ACT inhaler Inhale 2 puffs into the lungs every 6 (six) hours as needed for wheezing or shortness of breath. 1 Inhaler 2  . aspirin EC 325 MG tablet Take 1 tablet (325 mg total) by mouth 2 (two) times daily after a meal. Take x 1 month post op to decrease risk of blood clots. 60 tablet 0  . atorvastatin (LIPITOR) 40 MG tablet Take 40 mg by mouth daily.    . carvedilol (COREG) 3.125 MG tablet Take 1 tablet (3.125 mg total) by mouth 2 (two) times daily with a meal. 60 tablet 1  . cholecalciferol (VITAMIN D) 1000 UNITS tablet Take 1,000  Units by mouth daily.    Marland Kitchen HYDROcodone-acetaminophen (NORCO) 5-325 MG tablet Take 1-2 tablets by mouth every 6 (six) hours as needed for moderate pain. 50 tablet 0  . levothyroxine (SYNTHROID, LEVOTHROID) 75 MCG tablet Take 75 mcg by mouth daily before breakfast.    . lisinopril (PRINIVIL,ZESTRIL) 20 MG tablet Take 20 mg by mouth daily.    . Nutritional Supplements (ESTROVEN PO) Take 1 tablet by mouth every evening.    . Omega-3 Fatty Acids (FISH OIL) 1000 MG CAPS Take 1,000 mg by mouth daily.    . Potassium Gluconate 595 MG CAPS Take 1 capsule by mouth daily.    . traMADol (ULTRAM) 50 MG tablet Take 50 mg by mouth 4 (four) times daily as needed for moderate pain or severe pain.    . vitamin E 400 UNIT capsule Take 400 Units by mouth daily.    Marland Kitchen zolpidem (AMBIEN) 10 MG tablet Take 10 mg by mouth at bedtime as needed for sleep.     No current facility-administered medications for this visit.     Allergies as of 11/12/2017  . (No Known Allergies)    Family History  Problem Relation Age of Onset  . Heart disease Father   . Heart disease Sister   . Heart disease Brother   . Breast cancer Paternal Aunt   . Heart disease Son     Social History   Socioeconomic History  .  Marital status: Married    Spouse name: Not on file  . Number of children: 3  . Years of education: Not on file  . Highest education level: Not on file  Occupational History  . Occupation: retired  Scientific laboratory technician  . Financial resource strain: Not on file  . Food insecurity:    Worry: Not on file    Inability: Not on file  . Transportation needs:    Medical: Not on file    Non-medical: Not on file  Tobacco Use  . Smoking status: Current Every Day Smoker    Packs/day: 1.00  . Smokeless tobacco: Never Used  Substance and Sexual Activity  . Alcohol use: No  . Drug use: No  . Sexual activity: Not on file  Lifestyle  . Physical activity:    Days per week: Not on file    Minutes per session: Not on file  .  Stress: Not on file  Relationships  . Social connections:    Talks on phone: Not on file    Gets together: Not on file    Attends religious service: Not on file    Active member of club or organization: Not on file    Attends meetings of clubs or organizations: Not on file    Relationship status: Not on file  . Intimate partner violence:    Fear of current or ex partner: Not on file    Emotionally abused: Not on file    Physically abused: Not on file    Forced sexual activity: Not on file  Other Topics Concern  . Not on file  Social History Narrative  . Not on file     Physical Exam: BP (!) 158/100 (BP Location: Left Arm, Patient Position: Sitting, Cuff Size: Normal)   Pulse 92   Ht 5' 5.35" (1.66 m) Comment: height measured without shoes  Wt 164 lb 2 oz (74.4 kg)   BMI 27.02 kg/m  Constitutional: generally well-appearing Psychiatric: alert and oriented x3 Eyes: extraocular movements intact Mouth: oral pharynx moist, no lesions Neck: supple no lymphadenopathy Cardiovascular: heart regular rate and rhythm Lungs: clear to auscultation bilaterally Abdomen: soft, nontender, nondistended, no obvious ascites, no peritoneal signs, normal bowel sounds Extremities: no lower extremity edema bilaterally Skin: no lesions on visible extremities Rectal exam deferred for upcoming colonoscopy  Assessment and plan: 67 y.o. female with Hemoccult positive stool, external hemorrhoids  I explained the nature of Hemoccult positive stool and I recommended that she have colonoscopy at her soonest convenience to exclude significant neoplastic causes, remove any polyps that they are found.  By history she clearly has some external hemorrhoids.  She has very minor intermittent constipation, worse when she travels.  I recommend she start a fiber supplement on a daily basis and continue this when she travels and during travel to pay extra attention to hydration.  I see no reason for any further blood  tests or imaging studies and I will see her when she comes back in for her colonoscopy.    Please see the "Patient Instructions" section for addition details about the plan.   Owens Loffler, MD [facility] 11/12/2017, 9:38 AM  Cc: Sharilyn Sites, MD

## 2017-11-16 ENCOUNTER — Other Ambulatory Visit (HOSPITAL_COMMUNITY): Payer: Self-pay | Admitting: Family Medicine

## 2017-11-16 ENCOUNTER — Ambulatory Visit (HOSPITAL_COMMUNITY)
Admission: RE | Admit: 2017-11-16 | Discharge: 2017-11-16 | Disposition: A | Discharge status: Home / Self Care | Payer: 59 | Source: Ambulatory Visit | Attending: Family Medicine | Admitting: Family Medicine

## 2017-11-16 DIAGNOSIS — J9811 Atelectasis: Secondary | ICD-10-CM | POA: Diagnosis not present

## 2017-11-16 DIAGNOSIS — Z1389 Encounter for screening for other disorder: Secondary | ICD-10-CM | POA: Insufficient documentation

## 2017-11-16 DIAGNOSIS — R05 Cough: Secondary | ICD-10-CM | POA: Insufficient documentation

## 2017-11-16 DIAGNOSIS — R059 Cough, unspecified: Secondary | ICD-10-CM

## 2017-11-16 DIAGNOSIS — M549 Dorsalgia, unspecified: Secondary | ICD-10-CM | POA: Insufficient documentation

## 2017-12-05 ENCOUNTER — Ambulatory Visit (HOSPITAL_COMMUNITY)
Admission: RE | Admit: 2017-12-05 | Discharge: 2017-12-05 | Disposition: A | Discharge status: Home / Self Care | Payer: 59 | Source: Ambulatory Visit | Attending: Family Medicine | Admitting: Family Medicine

## 2017-12-05 ENCOUNTER — Other Ambulatory Visit (HOSPITAL_COMMUNITY): Payer: Self-pay | Admitting: Family Medicine

## 2017-12-05 DIAGNOSIS — M50322 Other cervical disc degeneration at C5-C6 level: Secondary | ICD-10-CM | POA: Insufficient documentation

## 2017-12-05 DIAGNOSIS — M546 Pain in thoracic spine: Secondary | ICD-10-CM | POA: Insufficient documentation

## 2017-12-05 DIAGNOSIS — Z6827 Body mass index (BMI) 27.0-27.9, adult: Secondary | ICD-10-CM | POA: Diagnosis not present

## 2017-12-05 DIAGNOSIS — E663 Overweight: Secondary | ICD-10-CM | POA: Diagnosis not present

## 2017-12-05 DIAGNOSIS — R52 Pain, unspecified: Secondary | ICD-10-CM

## 2017-12-05 DIAGNOSIS — G629 Polyneuropathy, unspecified: Secondary | ICD-10-CM | POA: Insufficient documentation

## 2017-12-06 ENCOUNTER — Encounter (HOSPITAL_COMMUNITY): Payer: Self-pay | Admitting: Emergency Medicine

## 2017-12-06 ENCOUNTER — Emergency Department (HOSPITAL_COMMUNITY)
Admission: EM | Admit: 2017-12-06 | Discharge: 2017-12-06 | Disposition: A | Discharge status: Home / Self Care | Payer: 59 | Attending: Emergency Medicine | Admitting: Emergency Medicine

## 2017-12-06 ENCOUNTER — Other Ambulatory Visit: Payer: Self-pay

## 2017-12-06 ENCOUNTER — Emergency Department (HOSPITAL_COMMUNITY): Payer: 59

## 2017-12-06 DIAGNOSIS — I110 Hypertensive heart disease with heart failure: Secondary | ICD-10-CM | POA: Diagnosis not present

## 2017-12-06 DIAGNOSIS — M79605 Pain in left leg: Secondary | ICD-10-CM | POA: Diagnosis present

## 2017-12-06 DIAGNOSIS — Z7982 Long term (current) use of aspirin: Secondary | ICD-10-CM | POA: Insufficient documentation

## 2017-12-06 DIAGNOSIS — M70862 Other soft tissue disorders related to use, overuse and pressure, left lower leg: Secondary | ICD-10-CM | POA: Diagnosis not present

## 2017-12-06 DIAGNOSIS — F17210 Nicotine dependence, cigarettes, uncomplicated: Secondary | ICD-10-CM | POA: Insufficient documentation

## 2017-12-06 DIAGNOSIS — Z79899 Other long term (current) drug therapy: Secondary | ICD-10-CM | POA: Diagnosis not present

## 2017-12-06 DIAGNOSIS — R2242 Localized swelling, mass and lump, left lower limb: Secondary | ICD-10-CM | POA: Insufficient documentation

## 2017-12-06 DIAGNOSIS — E039 Hypothyroidism, unspecified: Secondary | ICD-10-CM | POA: Diagnosis not present

## 2017-12-06 DIAGNOSIS — M779 Enthesopathy, unspecified: Secondary | ICD-10-CM

## 2017-12-06 DIAGNOSIS — J449 Chronic obstructive pulmonary disease, unspecified: Secondary | ICD-10-CM | POA: Diagnosis not present

## 2017-12-06 DIAGNOSIS — I5022 Chronic systolic (congestive) heart failure: Secondary | ICD-10-CM | POA: Diagnosis not present

## 2017-12-06 DIAGNOSIS — Y939 Activity, unspecified: Secondary | ICD-10-CM | POA: Insufficient documentation

## 2017-12-06 MED ORDER — HYDROCODONE-ACETAMINOPHEN 5-325 MG PO TABS
1.0000 | ORAL_TABLET | Freq: Once | ORAL | Status: AC
  Administered 2017-12-06: 1 via ORAL
  Filled 2017-12-06: qty 1

## 2017-12-06 MED ORDER — CELECOXIB 200 MG PO CAPS: 200.0000 mg | ORAL_CAPSULE | Freq: Two times a day (BID) | ORAL | 0 refills | Status: DC

## 2017-12-06 MED ORDER — HYDROCODONE-ACETAMINOPHEN 5-325 MG PO TABS: 1.0000 | ORAL_TABLET | ORAL | 0 refills | Status: DC | PRN

## 2017-12-06 NOTE — ED Triage Notes (Signed)
Pt went to urgent care and was told she had a possible DVT in left leg.  Pain with ambulation with redness and swelling

## 2017-12-06 NOTE — ED Provider Notes (Signed)
[facility] EMERGENCY DEPARTMENT Provider Note   CSN: [id number] Arrival date & time: 12/06/17  1202     History   Chief Complaint Chief Complaint  Patient presents with  . Leg Pain    HPI Penny Hicks is a 67 y.o. female.  Pt presents to the ED today with leg pain and swelling.  Sx started last week when she was in [place], [place] visiting relatives.  She drove there.  She did go to pcp yesterday who gave her a toradol injection and muscle relaxants.  The pain was still there, so she went to urgent care who told her that she may have a DVT.  She denies cp or swelling.  No hx of DVT.     Past Medical History:  Diagnosis Date  . Arthritis   . COPD (chronic obstructive pulmonary disease) ([place])   . Hypercholesterolemia   . Hypertension   . Hypothyroidism     Patient Active Problem List   Diagnosis Date Noted  . CHF (congestive heart failure) ([place])   . Chronic systolic heart failure ([place]) 05/27/2015  . Hip fracture, right ([place]) 05/24/2015  . Hip fracture ([place]) 05/24/2015  . TOBACCO ABUSE 07/05/2010  . Essential hypertension 07/05/2010  . GOITER, MULTINODULAR 06/21/2010  . HYPERLIPIDEMIA 06/21/2010  . Palpitations 06/21/2010  . CHEST DISCOMFORT 06/21/2010    Past Surgical History:  Procedure Laterality Date  . CESAREAN SECTION  x 2  . HIP PINNING,CANNULATED Right 05/25/2015   Procedure: CANNULATED HIP PINNING;  Surgeon: Dorna Leitz, MD;  Location: [place];  Service: Orthopedics;  Laterality: Right;  . TONSILLECTOMY       OB History   None      Home Medications    Prior to Admission medications   Medication Sig Start Date End Date Taking? Authorizing Provider  albuterol (PROVENTIL HFA;VENTOLIN HFA) 108 (90 BASE) MCG/ACT inhaler Inhale 2 puffs into the lungs every 6 (six) hours as needed for wheezing or shortness of breath. 05/28/15   Annita Brod, MD  aspirin EC 325 MG tablet Take 1 tablet (325 mg total) by mouth 2 (two) times daily after a meal. Take  x 1 month post op to decrease risk of blood clots. 05/25/15   Gary Fleet, PA-C  atorvastatin (LIPITOR) 40 MG tablet Take 40 mg by mouth daily.    [provider]  carvedilol (COREG) 3.125 MG tablet Take 1 tablet (3.125 mg total) by mouth 2 (two) times daily with a meal. 05/28/15   Annita Brod, MD  celecoxib (CELEBREX) 200 MG capsule Take 1 capsule (200 mg total) by mouth 2 (two) times daily. 12/06/17   Isla Pence, MD  cholecalciferol (VITAMIN D) 1000 UNITS tablet Take 1,000 Units by mouth daily.    [provider]  HYDROcodone-acetaminophen (NORCO/VICODIN) 5-325 MG tablet Take 1 tablet by mouth every 4 (four) hours as needed. 12/06/17   Isla Pence, MD  levothyroxine (SYNTHROID, LEVOTHROID) 75 MCG tablet Take 75 mcg by mouth daily before breakfast.    [provider]  lisinopril (PRINIVIL,ZESTRIL) 20 MG tablet Take 20 mg by mouth daily.    [provider]  Nutritional Supplements (ESTROVEN PO) Take 1 tablet by mouth every evening.    [provider]  Omega-3 Fatty Acids (FISH OIL) 1000 MG CAPS Take 1,000 mg by mouth daily.    [provider]  polyethylene glycol-electrolytes (NULYTELY/GOLYTELY) 420 g solution Take 4,000 mLs by mouth as directed. 11/12/17   Milus Banister, MD  Potassium Gluconate  595 MG CAPS Take 1 capsule by mouth daily.    [provider]  traMADol (ULTRAM) 50 MG tablet Take 50 mg by mouth 4 (four) times daily as needed for moderate pain or severe pain.    [provider]  vitamin E 400 UNIT capsule Take 400 Units by mouth daily.    [provider]  zolpidem (AMBIEN) 10 MG tablet Take 10 mg by mouth at bedtime as needed for sleep.    [provider]    Family History Family History  Problem Relation Age of Onset  . Heart disease Father   . Heart disease Sister   . Heart disease Brother   . Breast cancer Paternal Aunt   . Heart disease Son     Social History Social  History   Tobacco Use  . Smoking status: Current Every Day Smoker    Packs/day: 1.00  . Smokeless tobacco: Never Used  Substance Use Topics  . Alcohol use: No  . Drug use: No     Allergies   Patient has no known allergies.   Review of Systems Review of Systems  Musculoskeletal:       Left leg pain/swelling  All other systems reviewed and are negative.    Physical Exam Updated Vital Signs BP (!) 158/116   Pulse 96   Temp 97.7 F (36.5 C) (Oral)   Resp 18   Ht 5\' 5"  (1.651 m)   Wt 74.4 kg (164 lb)   SpO2 96%   BMI 27.29 kg/m   Physical Exam  Constitutional: She is oriented to person, place, and time. She appears well-developed and well-nourished.  HENT:  Head: Normocephalic and atraumatic.  Right Ear: External ear normal.  Left Ear: External ear normal.  Nose: Nose normal.  Mouth/Throat: Oropharynx is clear and moist.  Eyes: Pupils are equal, round, and reactive to light. Conjunctivae and EOM are normal.  Neck: Normal range of motion. Neck supple.  Cardiovascular: Normal rate, regular rhythm, normal heart sounds and intact distal pulses.  Pulmonary/Chest: Effort normal and breath sounds normal.  Abdominal: Soft. Bowel sounds are normal.  Musculoskeletal:       Legs: Neurological: She is alert and oriented to person, place, and time.  Skin: Skin is warm. Capillary refill takes less than 2 seconds.  Psychiatric: She has a normal mood and affect. Her behavior is normal. Judgment and thought content normal.  Nursing note and vitals reviewed.    ED Treatments / Results  Labs (all labs ordered are listed, but only abnormal results are displayed) Labs Reviewed - No data to display  EKG None  Radiology Dg Cervical Spine 2 Or 3 Views  Result Date: 12/06/2017 CLINICAL DATA:  Neck pain. EXAM: CERVICAL SPINE - 2-3 VIEW COMPARISON:  None. FINDINGS: Grade 1 anterolisthesis of C4-5 is noted secondary to posterior facet joint hypertrophy. Mild degenerative disc  disease is noted at C5-6 and C6-7 with anterior osteophyte formation. No fracture is noted. Degenerative changes seen involving posterior facet joints bilaterally. No prevertebral soft tissue swelling is noted. IMPRESSION: Multilevel degenerative disc disease. No acute abnormality seen in the cervical spine. Electronically Signed   By: Marijo Conception, M.D.   On: 12/06/2017 08:35   Dg Thoracic Spine W/swimmers  Result Date: 12/06/2017 CLINICAL DATA:  Back pain. EXAM: THORACIC SPINE - 3 VIEWS COMPARISON:  Radiographs of November 16, 2017. FINDINGS: Old compression deformities of T6 and T12 vertebral bodies are noted. No acute fracture or spondylolisthesis is noted. Disc  spaces are well-maintained. IMPRESSION: Old compression deformities of T6 and T12 vertebral bodies. No acute abnormality seen. Electronically Signed   By: Marijo Conception, M.D.   On: 12/06/2017 08:41   Dg Tibia/fibula Left  Result Date: 12/06/2017 CLINICAL DATA:  Left anterior lower leg pain after long trip. EXAM: LEFT TIBIA AND FIBULA - 2 VIEW COMPARISON:  None. FINDINGS: Mild diffuse osteopenia. No evidence of fracture or dislocation. Mild degenerate change of the midfoot/hindfoot. Small inferior calcaneal spur. IMPRESSION: No acute findings. Electronically Signed   By: Marin Olp M.D.   On: 12/06/2017 13:31   US Venous Img Lower  Left (dvt Study)  Result Date: 12/06/2017 CLINICAL DATA:  Left lower extremity pain and edema for the past week. Evaluate for DVT. EXAM: LEFT LOWER EXTREMITY VENOUS DOPPLER ULTRASOUND TECHNIQUE: Gray-scale sonography with graded compression, as well as color Doppler and duplex ultrasound were performed to evaluate the lower extremity deep venous systems from the level of the common femoral vein and including the common femoral, femoral, profunda femoral, popliteal and calf veins including the posterior tibial, peroneal and gastrocnemius veins when visible. The superficial great saphenous vein was also  interrogated. Spectral Doppler was utilized to evaluate flow at rest and with distal augmentation maneuvers in the common femoral, femoral and popliteal veins. COMPARISON:  None. FINDINGS: Contralateral Common Femoral Vein: Respiratory phasicity is normal and symmetric with the symptomatic side. No evidence of thrombus. Normal compressibility. Common Femoral Vein: No evidence of thrombus. Normal compressibility, respiratory phasicity and response to augmentation. Saphenofemoral Junction: No evidence of thrombus. Normal compressibility and flow on color Doppler imaging. Profunda Femoral Vein: No evidence of thrombus. Normal compressibility and flow on color Doppler imaging. Femoral Vein: No evidence of thrombus. Normal compressibility, respiratory phasicity and response to augmentation. Popliteal Vein: No evidence of thrombus. Normal compressibility, respiratory phasicity and response to augmentation. Calf Veins: No evidence of thrombus. Normal compressibility and flow on color Doppler imaging. Superficial Great Saphenous Vein: No evidence of thrombus. Normal compressibility. Venous Reflux:  None. Other Findings:  None. IMPRESSION: No evidence of DVT within the left lower extremity. Electronically Signed   By: Sandi Mariscal M.D.   On: 12/06/2017 13:25    Procedures Procedures (including critical care time)  Medications Ordered in ED Medications  HYDROcodone-acetaminophen (NORCO/VICODIN) 5-325 MG per tablet 1 tablet (1 tablet Oral Given 12/06/17 1237)     Initial Impression / Assessment and Plan / ED Course  I have reviewed the triage vital signs and the nursing notes.  Pertinent labs & imaging results that were available during my care of the patient were reviewed by me and considered in my medical decision making (see chart for details).    No DVT.  Likely tendonitis from driving.  No abnormal redness.  No fever.  Doubt cellulitis.  Pt placed in a knee immobilizer.  She is instructed to return if  worse.  F/u with ortho. Final Clinical Impressions(s) / ED Diagnoses   Final diagnoses:  Tendonitis    ED Discharge Orders        Ordered    celecoxib (CELEBREX) 200 MG capsule  2 times daily     12/06/17 1338    HYDROcodone-acetaminophen (NORCO/VICODIN) 5-325 MG tablet  Every 4 hours PRN     12/06/17 1338       Isla Pence, MD 12/06/17 1339

## 2017-12-19 ENCOUNTER — Ambulatory Visit (INDEPENDENT_AMBULATORY_CARE_PROVIDER_SITE_OTHER): Payer: 59 | Admitting: Orthopaedic Surgery

## 2017-12-19 ENCOUNTER — Encounter: Payer: Self-pay | Admitting: Orthopaedic Surgery

## 2017-12-19 VITALS — BP 156/92 | HR 109 | Temp 98.2°F | Ht 65.0 in | Wt 164.0 lb

## 2017-12-19 DIAGNOSIS — M79605 Pain in left leg: Secondary | ICD-10-CM | POA: Diagnosis not present

## 2017-12-19 MED ORDER — PREDNISONE 5 MG (21) PO TBPK: ORAL_TABLET | ORAL | 0 refills | Status: DC

## 2017-12-19 NOTE — Progress Notes (Signed)
Subjective:    Patient ID: Penny Hicks, female    DOB: 1950/09/07, 67 y.o.   MRN: [id number]  HPI She went in a long car ride to see relatives in [place], [place] about 3 weeks ago.  She did well.  She had no trauma.  On returning she noted pain in the left knee area, laterally and anterior just below the fibular head.  She has had some swelling, no redness.  The pain has been intense at times but has gotten a little better.  She was seen in the ER on 12-06-17.  X-rays and Doppler study were negative. She has had less pain with rest and ice but it still hurts.  She has no peroneal weakness, no trauma, no other joint or extremity pain.  She has pain after walking a good distance, no pain at rest or with little activity.   Review of Systems  Constitutional: Positive for activity change.  Respiratory: Positive for shortness of breath. Negative for cough.   Cardiovascular: Negative for chest pain and leg swelling.  Musculoskeletal: Positive for arthralgias and gait problem.  All other systems reviewed and are negative.  For Review of Systems, all other systems reviewed and are negative.  Past Medical History:  Diagnosis Date  . Arthritis   . COPD (chronic obstructive pulmonary disease) ([place])   . Hypercholesterolemia   . Hypertension   . Hypothyroidism     Past Surgical History:  Procedure Laterality Date  . CESAREAN SECTION  x 2  . HIP PINNING,CANNULATED Right 05/25/2015   Procedure: CANNULATED HIP PINNING;  Surgeon: Dorna Leitz, MD;  Location: [facility];  Service: Orthopedics;  Laterality: Right;  . TONSILLECTOMY      Current Outpatient Medications on File Prior to Visit  Medication Sig Dispense Refill  . albuterol (PROVENTIL HFA;VENTOLIN HFA) 108 (90 BASE) MCG/ACT inhaler Inhale 2 puffs into the lungs every 6 (six) hours as needed for wheezing or shortness of breath. 1 Inhaler 2  . aspirin EC 325 MG tablet Take 1 tablet (325 mg total) by mouth 2 (two) times daily after a meal.  Take x 1 month post op to decrease risk of blood clots. 60 tablet 0  . atorvastatin (LIPITOR) 40 MG tablet Take 40 mg by mouth daily.    . BELSOMRA 15 MG TABS Take 1 tablet by mouth at bedtime as needed. for sleep  1  . carvedilol (COREG) 3.125 MG tablet Take 1 tablet (3.125 mg total) by mouth 2 (two) times daily with a meal. 60 tablet 1  . celecoxib (CELEBREX) 200 MG capsule Take 1 capsule (200 mg total) by mouth 2 (two) times daily. 60 capsule 0  . cholecalciferol (VITAMIN D) 1000 UNITS tablet Take 1,000 Units by mouth daily.    Marland Kitchen HYDROcodone-acetaminophen (NORCO/VICODIN) 5-325 MG tablet Take 1 tablet by mouth every 4 (four) hours as needed. 10 tablet 0  . levothyroxine (SYNTHROID, LEVOTHROID) 75 MCG tablet Take 75 mcg by mouth daily before breakfast.    . lisinopril (PRINIVIL,ZESTRIL) 20 MG tablet Take 20 mg by mouth daily.    . Nutritional Supplements (ESTROVEN PO) Take 1 tablet by mouth every evening.    . Omega-3 Fatty Acids (FISH OIL) 1000 MG CAPS Take 1,000 mg by mouth daily.    . polyethylene glycol-electrolytes (NULYTELY/GOLYTELY) 420 g solution Take 4,000 mLs by mouth as directed. 4000 mL 0  . Potassium Gluconate 595 MG CAPS Take 1 capsule by mouth daily.    Marland Kitchen tiZANidine (ZANAFLEX) 2 MG tablet  TAKE 1 TABLET BY MOUTH THREE TIMES A DAY FOR 10 DAYS  1  . traMADol (ULTRAM) 50 MG tablet Take 50 mg by mouth 4 (four) times daily as needed for moderate pain or severe pain.    . vitamin E 400 UNIT capsule Take 400 Units by mouth daily.    Marland Kitchen zolpidem (AMBIEN) 10 MG tablet Take 10 mg by mouth at bedtime as needed for sleep.     No current facility-administered medications on file prior to visit.     Social History   Socioeconomic History  . Marital status: Married    Spouse name: Not on file  . Number of children: 3  . Years of education: Not on file  . Highest education level: Not on file  Occupational History  . Occupation: retired  Scientific laboratory technician  . Financial resource strain: Not on  file  . Food insecurity:    Worry: Not on file    Inability: Not on file  . Transportation needs:    Medical: Not on file    Non-medical: Not on file  Tobacco Use  . Smoking status: Current Every Day Smoker    Packs/day: 1.00  . Smokeless tobacco: Never Used  Substance and Sexual Activity  . Alcohol use: No  . Drug use: No  . Sexual activity: Not on file  Lifestyle  . Physical activity:    Days per week: Not on file    Minutes per session: Not on file  . Stress: Not on file  Relationships  . Social connections:    Talks on phone: Not on file    Gets together: Not on file    Attends religious service: Not on file    Active member of club or organization: Not on file    Attends meetings of clubs or organizations: Not on file    Relationship status: Not on file  . Intimate partner violence:    Fear of current or ex partner: Not on file    Emotionally abused: Not on file    Physically abused: Not on file    Forced sexual activity: Not on file  Other Topics Concern  . Not on file  Social History Narrative  . Not on file    Family History  Problem Relation Age of Onset  . Heart disease Father   . Heart disease Sister   . Heart disease Brother   . Breast cancer Paternal Aunt   . Heart disease Son     BP (!) 156/92   Pulse (!) 109   Temp 98.2 F (36.8 C)   Ht 5\' 5"  (1.651 m)   Wt 164 lb (74.4 kg)   BMI 27.29 kg/m   Body mass index is 27.29 kg/m.     Objective:   Physical Exam  Constitutional: She is oriented to person, place, and time. She appears well-developed and well-nourished.  HENT:  Head: Normocephalic and atraumatic.  Eyes: Pupils are equal, round, and reactive to light. Conjunctivae and EOM are normal.  Neck: Normal range of motion. Neck supple.  Cardiovascular: Normal rate, regular rhythm and intact distal pulses.  Pulmonary/Chest: Effort normal.  Abdominal: Soft.  Musculoskeletal:       Legs: Neurological: She is alert and oriented to  person, place, and time. She has normal reflexes. She displays normal reflexes. No cranial nerve deficit. She exhibits normal muscle tone. Coordination normal.  Skin: Skin is warm and dry.  Psychiatric: She has a normal mood and affect. Her behavior  is normal. Judgment and thought content normal.     I have reviewed the x-rays and reports, and the ER records.     Assessment & Plan:   Encounter Diagnosis  Name Primary?  . Left leg pain Yes   I have advised continue the ice.  I will give Rx for prednisone dose pack.  Return in two weeks.  She is going on another visit next week.  I have gone on precautions.  Call if any problem.  Precautions discussed.   Electronically Signed Sanjuana Kava, MD 7/10/201910:18 AM

## 2017-12-20 ENCOUNTER — Telehealth: Payer: Self-pay | Admitting: Orthopaedic Surgery

## 2017-12-20 NOTE — Telephone Encounter (Signed)
Just limit extra salty food.

## 2017-12-20 NOTE — Telephone Encounter (Signed)
Patient called with question regarding the instruction that Dr Luna Glasgow gave for no salt, asking if she is to have no added salt, or no salt at all, while taking medication prescribed: predniSONE (STERAPRED UNI-PAK 21 TAB) 5 MG (21) TBPK tablet 21 tablet  - [facility], [address]

## 2017-12-20 NOTE — Telephone Encounter (Signed)
Called back to patient to notify; left voice mail message to return call.

## 2017-12-20 NOTE — Telephone Encounter (Signed)
Patient returned call, relayed

## 2018-01-02 ENCOUNTER — Ambulatory Visit (INDEPENDENT_AMBULATORY_CARE_PROVIDER_SITE_OTHER): Payer: 59 | Admitting: Orthopaedic Surgery

## 2018-01-02 ENCOUNTER — Encounter: Payer: Self-pay | Admitting: Orthopaedic Surgery

## 2018-01-02 VITALS — BP 114/93 | HR 87 | Temp 97.9°F | Ht 65.0 in | Wt 163.0 lb

## 2018-01-02 DIAGNOSIS — M79605 Pain in left leg: Secondary | ICD-10-CM | POA: Diagnosis not present

## 2018-01-02 NOTE — Progress Notes (Signed)
CC:  My leg is much better  Her left leg is much improved.  She responded well to the prednisone dose pack.   She has normal gait, full ROM of the left knee and no pain.  NV intact.  Encounter Diagnosis  Name Primary?  . Left leg pain Yes   I will see as needed.  Call if any problem.  Precautions discussed.   Electronically Signed Sanjuana Kava, MD 7/24/20199:05 AM

## 2018-01-09 ENCOUNTER — Ambulatory Visit
Admission: RE | Admit: 2018-01-09 | Discharge: 2018-01-09 | Disposition: A | Discharge status: Home / Self Care | Payer: 59 | Source: Ambulatory Visit | Attending: Internal Medicine | Admitting: Internal Medicine

## 2018-01-09 DIAGNOSIS — E042 Nontoxic multinodular goiter: Secondary | ICD-10-CM

## 2018-02-06 ENCOUNTER — Encounter: Payer: Self-pay | Admitting: Gastroenterology

## 2018-02-06 ENCOUNTER — Ambulatory Visit (AMBULATORY_SURGERY_CENTER): Payer: 59 | Admitting: Gastroenterology

## 2018-02-06 VITALS — BP 129/80 | HR 68 | Temp 97.8°F | Resp 17 | Ht 65.0 in | Wt 164.0 lb

## 2018-02-06 DIAGNOSIS — K649 Unspecified hemorrhoids: Secondary | ICD-10-CM | POA: Diagnosis not present

## 2018-02-06 DIAGNOSIS — D124 Benign neoplasm of descending colon: Secondary | ICD-10-CM

## 2018-02-06 DIAGNOSIS — K5730 Diverticulosis of large intestine without perforation or abscess without bleeding: Secondary | ICD-10-CM | POA: Diagnosis not present

## 2018-02-06 DIAGNOSIS — D123 Benign neoplasm of transverse colon: Secondary | ICD-10-CM

## 2018-02-06 DIAGNOSIS — K921 Melena: Secondary | ICD-10-CM | POA: Diagnosis present

## 2018-02-06 MED ORDER — SODIUM CHLORIDE 0.9 % IV SOLN: 500.0000 mL | Freq: Once | INTRAVENOUS | Status: DC

## 2018-02-06 NOTE — Progress Notes (Signed)
Called to room to assist during endoscopic procedure.  Patient ID and intended procedure confirmed with present staff. Received instructions for my participation in the procedure from the performing physician.  

## 2018-02-06 NOTE — Progress Notes (Signed)
Pt's states no medical or surgical changes since previsit or office visit. 

## 2018-02-06 NOTE — Patient Instructions (Signed)
YOU HAD AN ENDOSCOPIC PROCEDURE TODAY AT THE [facility]:   Refer to the procedure report that was given to you for any specific questions about what was found during the examination.  If the procedure report does not answer your questions, please call your gastroenterologist to clarify.  If you requested that your care partner not be given the details of your procedure findings, then the procedure report has been included in a sealed envelope for you to review at your convenience later.  YOU SHOULD EXPECT: Some feelings of bloating in the abdomen. Passage of more gas than usual.  Walking can help get rid of the air that was put into your GI tract during the procedure and reduce the bloating. If you had a lower endoscopy (such as a colonoscopy or flexible sigmoidoscopy) you may notice spotting of blood in your stool or on the toilet paper. If you underwent a bowel prep for your procedure, you may not have a normal bowel movement for a few days.  Please Note:  You might notice some irritation and congestion in your nose or some drainage.  This is from the oxygen used during your procedure.  There is no need for concern and it should clear up in a day or so.  SYMPTOMS TO REPORT IMMEDIATELY:   Following lower endoscopy (colonoscopy or flexible sigmoidoscopy):  Excessive amounts of blood in the stool  Significant tenderness or worsening of abdominal pains  Swelling of the abdomen that is new, acute  Fever of 100F or higher  Please see handouts given to you on Polyps, Diverticulosis and Hemorrhoids.  For urgent or emergent issues, a gastroenterologist can be reached at any hour by calling [phone].   DIET:  We do recommend a small meal at first, but then you may proceed to your regular diet.  Drink plenty of fluids but you should avoid alcoholic beverages for 24 hours.  ACTIVITY:  You should plan to take it easy for the rest of today and you should NOT DRIVE or use heavy  machinery until tomorrow (because of the sedation medicines used during the test).    FOLLOW UP: Our staff will call the number listed on your records the next business day following your procedure to check on you and address any questions or concerns that you may have regarding the information given to you following your procedure. If we do not reach you, we will leave a message.  However, if you are feeling well and you are not experiencing any problems, there is no need to return our call.  We will assume that you have returned to your regular daily activities without incident.  If any biopsies were taken you will be contacted by phone or by letter within the next 1-3 weeks.  Please call us at [phone] if you have not heard about the biopsies in 3 weeks.    SIGNATURES/CONFIDENTIALITY: You and/or your care partner have signed paperwork which will be entered into your electronic medical record.  These signatures attest to the fact that that the information above on your After Visit Summary has been reviewed and is understood.  Full responsibility of the confidentiality of this discharge information lies with you and/or your care-partner.  Thank you for letting us take care of your healthcare needs today. 

## 2018-02-06 NOTE — Op Note (Signed)
[facility] Patient Name: Penny Hicks Procedure Date: 02/06/2018 9:08 AM MRN: [id number] Endoscopist: Milus Banister , MD Age: 67 Referring MD:  Date of Birth: 1950/08/26 Gender: Female Account #: [id number] Procedure:                Colonoscopy Indications:              Heme positive stool Medicines:                Monitored Anesthesia Care Procedure:                Pre-Anesthesia Assessment:                           - Prior to the procedure, a History and Physical                            was performed, and patient medications and                            allergies were reviewed. The patient's tolerance of                            previous anesthesia was also reviewed. The risks                            and benefits of the procedure and the sedation                            options and risks were discussed with the patient.                            All questions were answered, and informed consent                            was obtained. Prior Anticoagulants: The patient has                            taken no previous anticoagulant or antiplatelet                            agents. ASA Grade Assessment: II - A patient with                            mild systemic disease. After reviewing the risks                            and benefits, the patient was deemed in                            satisfactory condition to undergo the procedure.                           After obtaining informed consent, the colonoscope  was passed under direct vision. Throughout the                            procedure, the patient's blood pressure, pulse, and                            oxygen saturations were monitored continuously. The                            Model CF-HQ190L [id number]) scope was introduced                            through the anus and advanced to the the cecum,                            identified by appendiceal orifice and  ileocecal                            valve. The colonoscopy was performed without                            difficulty. The patient tolerated the procedure                            well. The quality of the bowel preparation was                            adequate. The ileocecal valve, appendiceal orifice,                            and rectum were photographed. Scope In: 9:19:48 AM Scope Out: 9:34:36 AM Scope Withdrawal Time: 0 hours 12 minutes 22 seconds  Total Procedure Duration: 0 hours 14 minutes 48 seconds  Findings:                 A 4 mm polyp was found in the transverse colon. The                            polyp was sessile. The polyp was removed with a                            cold snare. Resection and retrieval were complete.                           A 10 mm polyp was found in the descending colon.                            The polyp was pedunculated. The polyp was removed                            with a hot snare. Resection and retrieval were  complete.                           Multiple small-mouthed diverticula were found in                            the left colon.                           External and internal hemorrhoids were found. The                            hemorrhoids were small and medium-sized.                           The exam was otherwise without abnormality on                            direct and retroflexion views. Complications:            No immediate complications. Estimated blood loss:                            None. Estimated Blood Loss:     Estimated blood loss: none. Impression:               - One 4 mm polyp in the transverse colon, removed                            with a cold snare. Resected and retrieved.                           - One 10 mm polyp in the descending colon, removed                            with a hot snare. Resected and retrieved.                           - Diverticulosis in the left  colon.                           - External and internal hemorrhoids.                           - The examination was otherwise normal on direct                            and retroflexion views. Recommendation:           - Patient has a contact number available for                            emergencies. The signs and symptoms of potential                            delayed complications were discussed with the  patient. Return to normal activities tomorrow.                            Written discharge instructions were provided to the                            patient.                           - Resume previous diet.                           - Continue present medications.                           - Consider referral to one of Dr. Ardis Hughs' partners                            for in-office internal hemorrhoid banding.                           You will receive a letter within 2-3 weeks with the                            pathology results and my final recommendations.                           If the polyp(s) is proven to be 'pre-cancerous' on                            pathology, you will need repeat colonoscopy in 3-5                            years. If the polyp(s) is NOT 'precancerous' on                            pathology then you should repeat colon cancer                            screening in 10 years with colonoscopy without need                            for colon cancer screening by any method prior to                            then (including stool testing). Milus Banister, MD 02/06/2018 9:38:54 AM This report has been signed electronically.

## 2018-02-06 NOTE — Progress Notes (Signed)
To PACU, VSS. Report to [email or web address] 

## 2018-02-07 ENCOUNTER — Telehealth: Payer: Self-pay

## 2018-02-07 NOTE — Telephone Encounter (Signed)
duplicate

## 2018-02-07 NOTE — Telephone Encounter (Signed)
  Follow up Call-  Call back number 02/06/2018  Post procedure Call Back phone  # [phone]  Permission to leave phone message Yes  Some recent data might be hidden     Patient questions:  Do you have a fever, pain , or abdominal swelling? No. Pain Score  0 *  Have you tolerated food without any problems? Yes.    Have you been able to return to your normal activities? Yes.    Do you have any questions about your discharge instructions: Diet   No. Medications  No. Follow up visit  No.  Do you have questions or concerns about your Care? No.  Actions: * If pain score is 4 or above: No action needed, pain <4.

## 2018-02-13 ENCOUNTER — Encounter: Payer: Self-pay | Admitting: Gastroenterology

## 2018-11-19 ENCOUNTER — Other Ambulatory Visit: Payer: Self-pay | Admitting: Internal Medicine

## 2018-11-19 DIAGNOSIS — E042 Nontoxic multinodular goiter: Secondary | ICD-10-CM

## 2019-07-28 ENCOUNTER — Other Ambulatory Visit: Payer: Self-pay | Admitting: Obstetrics and Gynecology

## 2019-07-28 DIAGNOSIS — R928 Other abnormal and inconclusive findings on diagnostic imaging of breast: Secondary | ICD-10-CM

## 2019-08-04 ENCOUNTER — Ambulatory Visit
Admission: RE | Admit: 2019-08-04 | Discharge: 2019-08-04 | Disposition: A | Discharge status: Home / Self Care | Payer: 59 | Source: Ambulatory Visit | Attending: Obstetrics and Gynecology | Admitting: Obstetrics and Gynecology

## 2019-08-04 ENCOUNTER — Ambulatory Visit: Payer: 59

## 2019-08-04 ENCOUNTER — Other Ambulatory Visit: Payer: Self-pay

## 2019-08-04 DIAGNOSIS — R928 Other abnormal and inconclusive findings on diagnostic imaging of breast: Secondary | ICD-10-CM

## 2019-10-06 ENCOUNTER — Other Ambulatory Visit (HOSPITAL_COMMUNITY): Payer: Self-pay | Admitting: Physician Assistant

## 2019-10-06 DIAGNOSIS — J449 Chronic obstructive pulmonary disease, unspecified: Secondary | ICD-10-CM

## 2019-10-09 ENCOUNTER — Ambulatory Visit (HOSPITAL_COMMUNITY)
Admission: RE | Admit: 2019-10-09 | Discharge: 2019-10-09 | Disposition: A | Discharge status: Home / Self Care | Payer: 59 | Source: Ambulatory Visit | Attending: Physician Assistant | Admitting: Physician Assistant

## 2019-10-09 ENCOUNTER — Other Ambulatory Visit: Payer: Self-pay

## 2019-10-09 DIAGNOSIS — J449 Chronic obstructive pulmonary disease, unspecified: Secondary | ICD-10-CM | POA: Diagnosis present

## 2020-02-09 ENCOUNTER — Encounter (HOSPITAL_COMMUNITY): Payer: Self-pay | Admitting: Emergency Medicine

## 2020-02-09 ENCOUNTER — Other Ambulatory Visit: Payer: Self-pay

## 2020-02-09 ENCOUNTER — Emergency Department (HOSPITAL_COMMUNITY): Payer: 59

## 2020-02-09 ENCOUNTER — Inpatient Hospital Stay (HOSPITAL_COMMUNITY)
Admission: EM | Admit: 2020-02-09 | Discharge: 2020-02-12 | DRG: 871 | Disposition: A | Discharge status: Home / Self Care | Payer: 59 | Attending: Family Medicine | Admitting: Family Medicine

## 2020-02-09 DIAGNOSIS — R112 Nausea with vomiting, unspecified: Secondary | ICD-10-CM | POA: Diagnosis present

## 2020-02-09 DIAGNOSIS — Z7982 Long term (current) use of aspirin: Secondary | ICD-10-CM | POA: Diagnosis not present

## 2020-02-09 DIAGNOSIS — F17210 Nicotine dependence, cigarettes, uncomplicated: Secondary | ICD-10-CM | POA: Diagnosis present

## 2020-02-09 DIAGNOSIS — E222 Syndrome of inappropriate secretion of antidiuretic hormone: Secondary | ICD-10-CM | POA: Diagnosis present

## 2020-02-09 DIAGNOSIS — A419 Sepsis, unspecified organism: Secondary | ICD-10-CM | POA: Diagnosis not present

## 2020-02-09 DIAGNOSIS — I10 Essential (primary) hypertension: Secondary | ICD-10-CM | POA: Diagnosis present

## 2020-02-09 DIAGNOSIS — I5022 Chronic systolic (congestive) heart failure: Secondary | ICD-10-CM | POA: Diagnosis present

## 2020-02-09 DIAGNOSIS — I110 Hypertensive heart disease with heart failure: Secondary | ICD-10-CM | POA: Diagnosis present

## 2020-02-09 DIAGNOSIS — R410 Disorientation, unspecified: Secondary | ICD-10-CM | POA: Diagnosis present

## 2020-02-09 DIAGNOSIS — K921 Melena: Secondary | ICD-10-CM

## 2020-02-09 DIAGNOSIS — E785 Hyperlipidemia, unspecified: Secondary | ICD-10-CM | POA: Diagnosis present

## 2020-02-09 DIAGNOSIS — Z79899 Other long term (current) drug therapy: Secondary | ICD-10-CM | POA: Diagnosis not present

## 2020-02-09 DIAGNOSIS — J440 Chronic obstructive pulmonary disease with acute lower respiratory infection: Secondary | ICD-10-CM | POA: Diagnosis present

## 2020-02-09 DIAGNOSIS — J9601 Acute respiratory failure with hypoxia: Secondary | ICD-10-CM | POA: Diagnosis present

## 2020-02-09 DIAGNOSIS — E039 Hypothyroidism, unspecified: Secondary | ICD-10-CM | POA: Diagnosis present

## 2020-02-09 DIAGNOSIS — R197 Diarrhea, unspecified: Secondary | ICD-10-CM | POA: Diagnosis present

## 2020-02-09 DIAGNOSIS — R739 Hyperglycemia, unspecified: Secondary | ICD-10-CM | POA: Diagnosis present

## 2020-02-09 DIAGNOSIS — R0600 Dyspnea, unspecified: Secondary | ICD-10-CM

## 2020-02-09 DIAGNOSIS — F17200 Nicotine dependence, unspecified, uncomplicated: Secondary | ICD-10-CM | POA: Diagnosis present

## 2020-02-09 DIAGNOSIS — Z8249 Family history of ischemic heart disease and other diseases of the circulatory system: Secondary | ICD-10-CM

## 2020-02-09 DIAGNOSIS — A481 Legionnaires' disease: Secondary | ICD-10-CM | POA: Diagnosis present

## 2020-02-09 DIAGNOSIS — A4159 Other Gram-negative sepsis: Secondary | ICD-10-CM | POA: Diagnosis present

## 2020-02-09 DIAGNOSIS — J189 Pneumonia, unspecified organism: Secondary | ICD-10-CM | POA: Diagnosis present

## 2020-02-09 DIAGNOSIS — R6520 Severe sepsis without septic shock: Secondary | ICD-10-CM | POA: Diagnosis present

## 2020-02-09 DIAGNOSIS — Z20822 Contact with and (suspected) exposure to covid-19: Secondary | ICD-10-CM | POA: Diagnosis present

## 2020-02-09 DIAGNOSIS — J441 Chronic obstructive pulmonary disease with (acute) exacerbation: Secondary | ICD-10-CM | POA: Diagnosis present

## 2020-02-09 DIAGNOSIS — Z79890 Hormone replacement therapy (postmenopausal): Secondary | ICD-10-CM

## 2020-02-09 DIAGNOSIS — I340 Nonrheumatic mitral (valve) insufficiency: Secondary | ICD-10-CM | POA: Diagnosis not present

## 2020-02-09 LAB — COMPREHENSIVE METABOLIC PANEL
ALT: 16 U/L (ref 0–44)
AST: 18 U/L (ref 15–41)
Albumin: 3.9 g/dL (ref 3.5–5.0)
Alkaline Phosphatase: 83 U/L (ref 38–126)
Anion gap: 17 — ABNORMAL HIGH (ref 5–15)
BUN: 15 mg/dL (ref 8–23)
CO2: 23 mmol/L (ref 22–32)
Calcium: 9.6 mg/dL (ref 8.9–10.3)
Chloride: 91 mmol/L — ABNORMAL LOW (ref 98–111)
Creatinine, Ser: 0.92 mg/dL (ref 0.44–1.00)
GFR calc Af Amer: >60 mL/min (ref >60)
GFR calc non Af Amer: >60 mL/min (ref >60)
Glucose, Bld: 192 mg/dL — ABNORMAL HIGH (ref 70–99)
Potassium: 3.6 mmol/L (ref 3.5–5.1)
Sodium: 131 mmol/L — ABNORMAL LOW (ref 135–145)
Total Bilirubin: 0.7 mg/dL (ref 0.3–1.2)
Total Protein: 9 g/dL — ABNORMAL HIGH (ref 6.5–8.1)

## 2020-02-09 LAB — CBC WITH DIFFERENTIAL/PLATELET
Abs Immature Granulocytes: 0.13 10*3/uL — ABNORMAL HIGH (ref 0.00–0.07)
Basophils Absolute: 0.1 10*3/uL (ref 0.0–0.1)
Basophils Relative: 0 %
Eosinophils Absolute: 0 10*3/uL (ref 0.0–0.5)
Eosinophils Relative: 0 %
HCT: 55.6 % — ABNORMAL HIGH (ref 36.0–46.0)
Hemoglobin: 18.5 g/dL — ABNORMAL HIGH (ref 12.0–15.0)
Immature Granulocytes: 1 %
Lymphocytes Relative: 2 %
Lymphs Abs: 0.4 10*3/uL — ABNORMAL LOW (ref 0.7–4.0)
MCH: 32 pg (ref 26.0–34.0)
MCHC: 33.3 g/dL (ref 30.0–36.0)
MCV: 96 fL (ref 80.0–100.0)
Monocytes Absolute: 0.7 10*3/uL (ref 0.1–1.0)
Monocytes Relative: 4 %
Neutro Abs: 16.8 10*3/uL — ABNORMAL HIGH (ref 1.7–7.7)
Neutrophils Relative %: 93 %
Platelets: 280 10*3/uL (ref 150–400)
RBC: 5.79 MIL/uL — ABNORMAL HIGH (ref 3.87–5.11)
RDW: 12.1 % (ref 11.5–15.5)
WBC: 18.1 10*3/uL — ABNORMAL HIGH (ref 4.0–10.5)
nRBC: 0 % (ref 0.0–0.2)

## 2020-02-09 LAB — URINALYSIS, ROUTINE W REFLEX MICROSCOPIC
Bilirubin Urine: NEGATIVE
Glucose, UA: 150 mg/dL — AB
Ketones, ur: 5 mg/dL — AB
Nitrite: NEGATIVE
Protein, ur: 100 mg/dL — AB
Specific Gravity, Urine: 1.024 (ref 1.005–1.030)
pH: 5 (ref 5.0–8.0)

## 2020-02-09 LAB — PROTIME-INR
INR: 1.2 (ref 0.8–1.2)
Prothrombin Time: 14.3 seconds (ref 11.4–15.2)

## 2020-02-09 LAB — LACTIC ACID, PLASMA
Lactic Acid, Venous: 2.3 mmol/L (ref 0.5–1.9)
Lactic Acid, Venous: 1.9 mmol/L (ref 0.5–1.9)

## 2020-02-09 LAB — HIV ANTIBODY (ROUTINE TESTING W REFLEX): HIV Screen 4th Generation wRfx: NONREACTIVE

## 2020-02-09 LAB — GLUCOSE, CAPILLARY: Glucose-Capillary: 172 mg/dL — ABNORMAL HIGH (ref 70–99)

## 2020-02-09 LAB — SARS CORONAVIRUS 2 BY RT PCR (HOSPITAL ORDER, PERFORMED IN ~~LOC~~ HOSPITAL LAB): SARS Coronavirus 2: NEGATIVE

## 2020-02-09 LAB — APTT: aPTT: 27 seconds (ref 24–36)

## 2020-02-09 MED ORDER — CELECOXIB 100 MG PO CAPS
200.0000 mg | ORAL_CAPSULE | Freq: Every day | ORAL | Status: DC
  Administered 2020-02-09 – 2020-02-12 (×4): 200 mg via ORAL
  Filled 2020-02-09 (×4): qty 2

## 2020-02-09 MED ORDER — CARVEDILOL 3.125 MG PO TABS
3.1250 mg | ORAL_TABLET | Freq: Two times a day (BID) | ORAL | Status: DC
  Administered 2020-02-09 – 2020-02-12 (×7): 3.125 mg via ORAL
  Filled 2020-02-09 (×7): qty 1

## 2020-02-09 MED ORDER — HEPARIN SODIUM (PORCINE) 5000 UNIT/ML IJ SOLN
5000.0000 [IU] | Freq: Three times a day (TID) | INTRAMUSCULAR | Status: DC
  Administered 2020-02-09 – 2020-02-12 (×10): 5000 [IU] via SUBCUTANEOUS
  Filled 2020-02-09 (×11): qty 1

## 2020-02-09 MED ORDER — SODIUM CHLORIDE 0.9 % IV SOLN
1.0000 g | Freq: Once | INTRAVENOUS | Status: AC
  Administered 2020-02-09: 1 g via INTRAVENOUS
  Filled 2020-02-09: qty 10

## 2020-02-09 MED ORDER — ALBUTEROL SULFATE (2.5 MG/3ML) 0.083% IN NEBU: 2.5000 mg | INHALATION_SOLUTION | RESPIRATORY_TRACT | Status: DC | PRN

## 2020-02-09 MED ORDER — METHYLPREDNISOLONE SODIUM SUCC 125 MG IJ SOLR
125.0000 mg | Freq: Once | INTRAMUSCULAR | Status: AC
  Administered 2020-02-09: 125 mg via INTRAVENOUS
  Filled 2020-02-09: qty 2

## 2020-02-09 MED ORDER — SODIUM CHLORIDE 0.9 % IV SOLN
500.0000 mg | INTRAVENOUS | Status: DC
  Administered 2020-02-09 – 2020-02-11 (×3): 500 mg via INTRAVENOUS
  Filled 2020-02-09 (×3): qty 500

## 2020-02-09 MED ORDER — TRAZODONE HCL 50 MG PO TABS
100.0000 mg | ORAL_TABLET | Freq: Every day | ORAL | Status: DC
  Administered 2020-02-09 – 2020-02-11 (×3): 100 mg via ORAL
  Filled 2020-02-09 (×3): qty 2

## 2020-02-09 MED ORDER — ASPIRIN EC 325 MG PO TBEC
325.0000 mg | DELAYED_RELEASE_TABLET | Freq: Every day | ORAL | Status: DC
  Administered 2020-02-09 – 2020-02-12 (×4): 325 mg via ORAL
  Filled 2020-02-09 (×4): qty 1

## 2020-02-09 MED ORDER — SODIUM CHLORIDE 0.9 % IV SOLN
250.0000 mL | INTRAVENOUS | Status: DC | PRN
  Administered 2020-02-10: 250 mL via INTRAVENOUS

## 2020-02-09 MED ORDER — ACETAMINOPHEN 325 MG PO TABS
650.0000 mg | ORAL_TABLET | Freq: Once | ORAL | Status: AC
  Administered 2020-02-09: 650 mg via ORAL
  Filled 2020-02-09: qty 2

## 2020-02-09 MED ORDER — ONDANSETRON HCL 4 MG PO TABS
4.0000 mg | ORAL_TABLET | Freq: Four times a day (QID) | ORAL | Status: DC | PRN
  Administered 2020-02-10: 4 mg via ORAL
  Filled 2020-02-09: qty 1

## 2020-02-09 MED ORDER — METHYLPREDNISOLONE SODIUM SUCC 40 MG IJ SOLR
40.0000 mg | Freq: Two times a day (BID) | INTRAMUSCULAR | Status: DC
  Administered 2020-02-09 – 2020-02-11 (×5): 40 mg via INTRAVENOUS
  Filled 2020-02-09 (×6): qty 1

## 2020-02-09 MED ORDER — SODIUM CHLORIDE 0.9% FLUSH
3.0000 mL | Freq: Two times a day (BID) | INTRAVENOUS | Status: DC
  Administered 2020-02-09 – 2020-02-10 (×3): 3 mL via INTRAVENOUS

## 2020-02-09 MED ORDER — INSULIN ASPART 100 UNIT/ML ~~LOC~~ SOLN
0.0000 [IU] | Freq: Three times a day (TID) | SUBCUTANEOUS | Status: DC
  Administered 2020-02-10 – 2020-02-11 (×5): 1 [IU] via SUBCUTANEOUS
  Administered 2020-02-12: 2 [IU] via SUBCUTANEOUS

## 2020-02-09 MED ORDER — ATORVASTATIN CALCIUM 40 MG PO TABS
40.0000 mg | ORAL_TABLET | Freq: Every day | ORAL | Status: DC
  Administered 2020-02-09 – 2020-02-12 (×4): 40 mg via ORAL
  Filled 2020-02-09 (×4): qty 1

## 2020-02-09 MED ORDER — GUAIFENESIN ER 600 MG PO TB12
600.0000 mg | ORAL_TABLET | Freq: Two times a day (BID) | ORAL | Status: DC
  Administered 2020-02-09 – 2020-02-12 (×7): 600 mg via ORAL
  Filled 2020-02-09 (×7): qty 1

## 2020-02-09 MED ORDER — LEVOTHYROXINE SODIUM 75 MCG PO TABS
75.0000 ug | ORAL_TABLET | Freq: Every day | ORAL | Status: DC
  Administered 2020-02-09 – 2020-02-12 (×4): 75 ug via ORAL
  Filled 2020-02-09 (×2): qty 1
  Filled 2020-02-09: qty 2
  Filled 2020-02-09: qty 1

## 2020-02-09 MED ORDER — SODIUM CHLORIDE 0.9 % IV SOLN
2.0000 g | INTRAVENOUS | Status: DC
  Administered 2020-02-09 – 2020-02-11 (×3): 2 g via INTRAVENOUS
  Filled 2020-02-09 (×3): qty 20

## 2020-02-09 MED ORDER — SODIUM CHLORIDE 0.9% FLUSH: 3.0000 mL | INTRAVENOUS | Status: DC | PRN

## 2020-02-09 MED ORDER — ONDANSETRON HCL 4 MG/2ML IJ SOLN: 4.0000 mg | Freq: Four times a day (QID) | INTRAMUSCULAR | Status: DC | PRN

## 2020-02-09 MED ORDER — HYDROCODONE-ACETAMINOPHEN 5-325 MG PO TABS: 1.0000 | ORAL_TABLET | Freq: Four times a day (QID) | ORAL | Status: DC | PRN

## 2020-02-09 MED ORDER — LISINOPRIL 10 MG PO TABS
20.0000 mg | ORAL_TABLET | Freq: Every day | ORAL | Status: DC
  Administered 2020-02-09 – 2020-02-12 (×4): 20 mg via ORAL
  Filled 2020-02-09 (×4): qty 2

## 2020-02-09 MED ORDER — ALBUTEROL SULFATE (2.5 MG/3ML) 0.083% IN NEBU: 3.0000 mL | INHALATION_SOLUTION | Freq: Four times a day (QID) | RESPIRATORY_TRACT | Status: DC | PRN

## 2020-02-09 MED ORDER — LACTATED RINGERS IV SOLN: INTRAVENOUS | Status: AC

## 2020-02-09 MED ORDER — VITAMIN D 25 MCG (1000 UNIT) PO TABS
1000.0000 [IU] | ORAL_TABLET | Freq: Every day | ORAL | Status: DC
  Administered 2020-02-09 – 2020-02-12 (×4): 1000 [IU] via ORAL
  Filled 2020-02-09 (×4): qty 1

## 2020-02-09 MED ORDER — TRAZODONE HCL 50 MG PO TABS: 100.0000 mg | ORAL_TABLET | Freq: Every evening | ORAL | Status: DC | PRN

## 2020-02-09 MED ORDER — POLYETHYLENE GLYCOL 3350 17 G PO PACK
17.0000 g | PACK | Freq: Every day | ORAL | Status: DC | PRN
  Administered 2020-02-11: 17 g via ORAL
  Filled 2020-02-09: qty 1

## 2020-02-09 MED ORDER — ASPIRIN EC 325 MG PO TBEC: 325.0000 mg | DELAYED_RELEASE_TABLET | Freq: Two times a day (BID) | ORAL | Status: DC

## 2020-02-09 MED ORDER — ZOLPIDEM TARTRATE 5 MG PO TABS: 5.0000 mg | ORAL_TABLET | Freq: Every evening | ORAL | Status: DC | PRN

## 2020-02-09 MED ORDER — TRAMADOL HCL 50 MG PO TABS: 50.0000 mg | ORAL_TABLET | Freq: Four times a day (QID) | ORAL | Status: DC | PRN

## 2020-02-09 MED ORDER — VITAMIN E 180 MG (400 UNIT) PO CAPS
400.0000 [IU] | ORAL_CAPSULE | Freq: Every day | ORAL | Status: DC
  Administered 2020-02-10 – 2020-02-12 (×3): 400 [IU] via ORAL
  Filled 2020-02-09: qty 4
  Filled 2020-02-09: qty 1
  Filled 2020-02-09 (×5): qty 4

## 2020-02-09 MED ORDER — INSULIN ASPART 100 UNIT/ML ~~LOC~~ SOLN: 0.0000 [IU] | Freq: Every day | SUBCUTANEOUS | Status: DC

## 2020-02-09 MED ORDER — IPRATROPIUM-ALBUTEROL 0.5-2.5 (3) MG/3ML IN SOLN
3.0000 mL | Freq: Three times a day (TID) | RESPIRATORY_TRACT | Status: DC
  Administered 2020-02-09 – 2020-02-12 (×10): 3 mL via RESPIRATORY_TRACT
  Filled 2020-02-09 (×10): qty 3

## 2020-02-09 MED ORDER — SODIUM CHLORIDE 0.9 % IV SOLN: 500.0000 mL | Freq: Once | INTRAVENOUS | Status: DC

## 2020-02-09 NOTE — ED Triage Notes (Signed)
Pt states she has been feeling sick for a week with vomiting and diarrhea. States she thinks she has Covid. Has not been vaccinated.

## 2020-02-09 NOTE — ED Notes (Signed)
CRITICAL VALUE ALERT  Critical Value:  Lactic acid 2.3  Date & Time Notied:  02/09/2020 0547  Provider Notified: Dr. Betsey Holiday  Orders Received/Actions taken: see chart

## 2020-02-09 NOTE — ED Notes (Signed)
Pt noted to have 02 saturation 86%-88%. Pt placed on 2 liters for comfort. Pt denies any pain. nad noted. Pt resting comfortably at this time.

## 2020-02-09 NOTE — ED Provider Notes (Signed)
[facility] Provider Note   CSN: [id number] Arrival date & time: 02/09/20  0018     History Chief Complaint  Patient presents with  . Fever    Penny Hicks is a 69 y.o. female.  Patient presents to the emergency department for evaluation of illness for approximately a week.  Patient does have a history of COPD, reports cough, chest congestion and shortness of breath.  She has had associated nausea, vomiting and diarrhea.  She has not been vaccinated for Covid, is concerned about possible Covid infection.        Past Medical History:  Diagnosis Date  . Arthritis   . COPD (chronic obstructive pulmonary disease) ([place])   . Hypercholesterolemia   . Hypertension   . Hypothyroidism     Patient Active Problem List   Diagnosis Date Noted  . CHF (congestive heart failure) ([place])   . Chronic systolic heart failure ([place]) 05/27/2015  . Hip fracture, right ([place]) 05/24/2015  . Hip fracture ([place]) 05/24/2015  . TOBACCO ABUSE 07/05/2010  . Essential hypertension 07/05/2010  . GOITER, MULTINODULAR 06/21/2010  . HYPERLIPIDEMIA 06/21/2010  . Palpitations 06/21/2010  . CHEST DISCOMFORT 06/21/2010    Past Surgical History:  Procedure Laterality Date  . CESAREAN SECTION  x 2  . HIP PINNING,CANNULATED Right 05/25/2015   Procedure: CANNULATED HIP PINNING;  Surgeon: Dorna Leitz, MD;  Location: [facility];  Service: Orthopedics;  Laterality: Right;  . TONSILLECTOMY       OB History   No obstetric history on file.     Family History  Problem Relation Age of Onset  . Heart disease Father   . Heart disease Sister   . Heart disease Brother   . Breast cancer Paternal Aunt   . Heart disease Son     Social History   Tobacco Use  . Smoking status: Current Every Day Smoker    Packs/day: 1.00  . Smokeless tobacco: Never Used  Vaping Use  . Vaping Use: Never assessed  Substance Use Topics  . Alcohol use: No  . Drug use: No    Home Medications Prior to  Admission medications   Medication Sig Start Date End Date Taking? Authorizing Provider  albuterol (PROVENTIL HFA;VENTOLIN HFA) 108 (90 BASE) MCG/ACT inhaler Inhale 2 puffs into the lungs every 6 (six) hours as needed for wheezing or shortness of breath. 05/28/15   Annita Brod, MD  aspirin EC 325 MG tablet Take 1 tablet (325 mg total) by mouth 2 (two) times daily after a meal. Take x 1 month post op to decrease risk of blood clots. 05/25/15   Gary Fleet, PA-C  atorvastatin (LIPITOR) 40 MG tablet Take 40 mg by mouth daily.    [provider]  BELSOMRA 15 MG TABS Take 1 tablet by mouth at bedtime as needed. for sleep 12/06/17   [provider]  carvedilol (COREG) 3.125 MG tablet Take 1 tablet (3.125 mg total) by mouth 2 (two) times daily with a meal. 05/28/15   Annita Brod, MD  celecoxib (CELEBREX) 200 MG capsule Take 1 capsule (200 mg total) by mouth 2 (two) times daily. 12/06/17   Isla Pence, MD  cholecalciferol (VITAMIN D) 1000 UNITS tablet Take 1,000 Units by mouth daily.    [provider]  HYDROcodone-acetaminophen (NORCO/VICODIN) 5-325 MG tablet Take 1 tablet by mouth every 4 (four) hours as needed. 12/06/17   Isla Pence, MD  levothyroxine (SYNTHROID, LEVOTHROID) 75 MCG tablet Take 75 mcg by mouth daily  before breakfast.    [provider]  lisinopril (PRINIVIL,ZESTRIL) 20 MG tablet Take 20 mg by mouth daily.    [provider]  Nutritional Supplements (ESTROVEN PO) Take 1 tablet by mouth every evening.    [provider]  Omega-3 Fatty Acids (FISH OIL) 1000 MG CAPS Take 1,000 mg by mouth daily.    [provider]  Potassium Gluconate 595 MG CAPS Take 1 capsule by mouth daily.    [provider]  traMADol (ULTRAM) 50 MG tablet Take 50 mg by mouth 4 (four) times daily as needed for moderate pain or severe pain.    [provider]  vitamin E 400 UNIT capsule Take 400 Units by mouth daily.     [provider]  zolpidem (AMBIEN) 10 MG tablet Take 10 mg by mouth at bedtime as needed for sleep.    [provider]    Allergies    Patient has no known allergies.  Review of Systems   Review of Systems  Constitutional: Positive for fatigue and fever.  Respiratory: Positive for cough and shortness of breath.   Gastrointestinal: Positive for diarrhea, nausea and vomiting.  All other systems reviewed and are negative.   Physical Exam Updated Vital Signs BP 112/86   Pulse (!) 125   Temp (!) 100.8 F (38.2 C) (Oral)   Resp (!) 31   Ht 5\' 4"  (1.626 m)   Wt 65.1 kg   SpO2 93%   BMI 24.65 kg/m   Physical Exam Vitals and nursing note reviewed.  Constitutional:      General: She is not in acute distress.    Appearance: Normal appearance. She is well-developed.  HENT:     Head: Normocephalic and atraumatic.     Right Ear: Hearing normal.     Left Ear: Hearing normal.     Nose: Nose normal.  Eyes:     Conjunctiva/sclera: Conjunctivae normal.     Pupils: Pupils are equal, round, and reactive to light.  Cardiovascular:     Rate and Rhythm: Regular rhythm. Tachycardia present.     Heart sounds: S1 normal and S2 normal. No murmur heard.  No friction rub. No gallop.   Pulmonary:     Effort: Pulmonary effort is normal. No respiratory distress.     Breath sounds: Normal breath sounds.  Chest:     Chest wall: No tenderness.  Abdominal:     General: Bowel sounds are normal.     Palpations: Abdomen is soft.     Tenderness: There is no abdominal tenderness. There is no guarding or rebound. Negative signs include Murphy's sign and McBurney's sign.     Hernia: No hernia is present.  Musculoskeletal:        General: Normal range of motion.     Cervical back: Normal range of motion and neck supple.  Skin:    General: Skin is warm and dry.     Findings: No rash.  Neurological:     Mental Status: She is alert. She is disoriented.     GCS: GCS eye subscore is 4.  GCS verbal subscore is 5. GCS motor subscore is 6.     Cranial Nerves: No cranial nerve deficit.     Sensory: No sensory deficit.     Coordination: Coordination normal.  Psychiatric:        Speech: Speech normal.        Behavior: Behavior normal.        Thought Content: Thought  content normal.     ED Results / Procedures / Treatments   Labs (all labs ordered are listed, but only abnormal results are displayed) Labs Reviewed  LACTIC ACID, PLASMA - Abnormal; Notable for the following components:      Result Value   Lactic Acid, Venous 2.3 (*)    All other components within normal limits  CBC WITH DIFFERENTIAL/PLATELET - Abnormal; Notable for the following components:   WBC 18.1 (*)    RBC 5.79 (*)    Hemoglobin 18.5 (*)    HCT 55.6 (*)    Neutro Abs 16.8 (*)    Lymphs Abs 0.4 (*)    Abs Immature Granulocytes 0.13 (*)    All other components within normal limits  SARS CORONAVIRUS 2 BY RT PCR (HOSPITAL ORDER, [place] [facility])  CULTURE, BLOOD (ROUTINE X 2)  CULTURE, BLOOD (ROUTINE X 2)  URINE CULTURE  PROTIME-INR  APTT  LACTIC ACID, PLASMA  COMPREHENSIVE METABOLIC PANEL  URINALYSIS, ROUTINE W REFLEX MICROSCOPIC    EKG EKG Interpretation  Date/Time:  Monday February 09 2020 03:37:20 EDT Ventricular Rate:  120 PR Interval:    QRS Duration: 117 QT Interval:  321 QTC Calculation: 454 R Axis:   31 Text Interpretation: Sinus tachycardia Multiform ventricular premature complexes Left atrial enlargement Left ventricular hypertrophy Borderline T abnormalities, lateral leads Baseline wander in lead(s) I III aVL V5 V6 Confirmed by Orpah Greek ([id number]) on 02/09/2020 3:57:34 AM   Radiology DG Chest Port 1 View  Result Date: 02/09/2020 CLINICAL DATA:  Questionable sepsis. EXAM: PORTABLE CHEST 1 VIEW COMPARISON:  10/09/2019 FINDINGS: Chronic cardiomegaly. Airspace disease at the right upper lobe at least. Left mid pulmonary scarring that is mild. No  edema, effusion, or pneumothorax. Artifact from EKG leads. IMPRESSION: 1. Pneumonia in the right upper lobe at least. 2. Chronic cardiomegaly. Electronically Signed   By: Monte Fantasia M.D.   On: 02/09/2020 04:57    Procedures Procedures (including critical care time)  Medications Ordered in ED Medications  lactated ringers infusion ( Intravenous New Bag/Given 02/09/20 0431)  azithromycin (ZITHROMAX) 500 mg in sodium chloride 0.9 % 250 mL IVPB (has no administration in time range)  methylPREDNISolone sodium succinate (SOLU-MEDROL) 125 mg/2 mL injection 125 mg (has no administration in time range)  cefTRIAXone (ROCEPHIN) 1 g in sodium chloride 0.9 % 100 mL IVPB (0 g Intravenous Stopped 02/09/20 0500)  acetaminophen (TYLENOL) tablet 650 mg (650 mg Oral Given 02/09/20 0427)    ED Course  I have reviewed the triage vital signs and the nursing notes.  Pertinent labs & imaging results that were available during my care of the patient were reviewed by me and considered in my medical decision making (see chart for details).    MDM Rules/Calculators/A&P                          Patient presents to the emergency department for evaluation of shortness of breath and febrile illness.  Patient with history of COPD.  Covid test was negative.  Chest x-ray shows right upper lobe pneumonia.  Patient presented with fever of 100.8, tachycardia, and has moderate leukocytosis consistent with early sepsis.  Lactic acid only mildly elevated at 2.3, no hypotension.  Patient treated with Rocephin and Zithromax for community-acquired pneumonia, Solu-Medrol for COPD.  Will require hospitalization for further management.  CRITICAL CARE Performed by: Orpah Greek   Total critical care time: 35 minutes  Critical  care time was exclusive of separately billable procedures and treating other patients.  Critical care was necessary to treat or prevent imminent or life-threatening deterioration.  Critical care  was time spent personally by me on the following activities: development of treatment plan with patient and/or surrogate as well as nursing, discussions with consultants, evaluation of patient's response to treatment, examination of patient, obtaining history from patient or surrogate, ordering and performing treatments and interventions, ordering and review of laboratory studies, ordering and review of radiographic studies, pulse oximetry and re-evaluation of patient's condition.   Final Clinical Impression(s) / ED Diagnoses Final diagnoses:  Community acquired pneumonia of right upper lobe of lung  Sepsis without acute organ dysfunction, due to unspecified organism [facility])    Rx / DC Orders ED Discharge Orders    None       Yomayra Tate, Gwenyth Allegra, MD 02/09/20 0502

## 2020-02-09 NOTE — H&P (Signed)
Patient Demographics:    Penny Hicks, is a 69 y.o. female  MRN: [id number]   DOB - 10-Nov-1950  Admit Date - 02/09/2020  Outpatient Primary MD for the patient is Sharilyn Sites, MD   Assessment & Plan:    Principal Problem:   Sepsis due to pneumonia [facility]) Active Problems:   Acute respiratory failure with hypoxia due to PNA/COPD   Chronic HFrEF (heart failure with reduced ejection fraction) ([place])   TOBACCO ABUSE   Essential hypertension    1)Severe sepsis due to pneumonia--patient met sepsis criteria on admission with fevers, tachypnea, tachycardia, hypoxia and elevated lactic acid in the setting of right-sided pneumonia -Lactic acid improved to 1.9 from 2.3 after IV fluids -Covid test negative -Check Legionella -Continue IV Rocephin and azithromycin -Blood cultures pending  2)CAP/right-sided pneumonia--- patient has had emesis, ???  Aspiration related -Treat as above #1 -Mucolytics & bronchodilators as ordered  3)HFrEF--patient with history of chronic systolic dysfunction CHF, last known EF 40 to 45% -Given aggressive IV fluids for sepsis protocol -Check echo,  -de-escalate IV fluids when sepsis pathophysiology improves  4) COPD exacerbation and  tobacco abuse--- COPD exacerbation noted, patient is not interested in smoking cessation, IV steroids, bronchodilators and mucolytics as ordered  5) vomiting or diarrhea--- symptomatic and supportive treatment at this time, consider further testing if diarrhea persist  6) hypothyroidism----continue levothyroxine  7) hyperglycemia--- check A1c, Use Novolog/Humalog Sliding scale insulin with Accu-Cheks/Fingersticks as ordered   8) possible UTI--- IV Rocephin pending urine culture  9) hyponatremia--- ??  SIADH from pneumonia versus dehydration in setting  of vomiting and diarrhea-- --normal saline/LR as ordered  10) HTN continue Coreg and lisinopril  11) acute hypoxic respiratory failure--secondary to #1 #2 #4 above--treat as above  Disposition/Need for in-Hospital Stay- patient unable to be discharged at this time due to --- sepsis secondary to pneumonia on and possible UTI requiring IV antibiotics and IV fluids, hypoxic respiratory failure requiring supplemental oxygen and IV steroids  Status is: Inpatient  Remains inpatient appropriate because: sepsis secondary to pneumonia on and possible UTI requiring IV antibiotics and IV fluids, hypoxic respiratory failure requiring supplemental oxygen and IV steroids   Dispo: The patient is from: Home              Anticipated d/c is to: Home              Anticipated d/c date is: 3 days              Patient currently is not medically stable to d/c. Barriers: Not Clinically Stable-  sepsis secondary to pneumonia  and possible UTI requiring IV antibiotics and IV fluids, hypoxic respiratory failure requiring supplemental oxygen and IV steroids    With History of - Reviewed by me  Past Medical History:  Diagnosis Date  . Arthritis   . COPD (chronic obstructive pulmonary disease) ([place])   . Hypercholesterolemia   . Hypertension   . Hypothyroidism  Past Surgical History:  Procedure Laterality Date  . CESAREAN SECTION  x 2  . HIP PINNING,CANNULATED Right 05/25/2015   Procedure: CANNULATED HIP PINNING;  Surgeon: Dorna Leitz, MD;  Location: [facility];  Service: Orthopedics;  Laterality: Right;  . TONSILLECTOMY        Chief Complaint  Patient presents with  . Fever      HPI:    Penny Hicks  is a 69 y.o. female will come with past medical history relevant for HTN, hypothyroidism, HLD and COPD who is not vaccinated against COVID-19 infection presents with a 1 week history of fatigue, malaise, fevers, chills, vomiting and diarrhea- --emesis without blood or bile diarrhea without  mucus or blood -Additional history obtained from patient's husband at bedside -No pleuritic symptoms no leg pains or leg swellings -No sick contacts at home patient's husband is vaccinated against COVID-19 infection - The ED chest x-ray suggest pneumonia , Covid 19 test negative --=-UA is suggestive of UTI -Lactic acid elevated to 2.3 after IV fluids down to 1.9 -patient is found to have 18.1 -Glucose is elevated to 192 with a sodium of 131 -Blood cultures obtained and antibiotics initiated  -Patient was admitted due to  sepsis secondary to pneumonia on and possible UTI requiring IV antibiotics and IV fluids, hypoxic respiratory failure requiring supplemental oxygen and IV steroids     Review of systems:    In addition to the HPI above,   A full Review of  Systems was done, all other systems reviewed are negative except as noted above in HPI , .    Social History:  Reviewed by me    Social History   Tobacco Use  . Smoking status: Current Every Day Smoker    Packs/day: 1.00  . Smokeless tobacco: Never Used  Substance Use Topics  . Alcohol use: No     Family History :  Reviewed by me    Family History  Problem Relation Age of Onset  . Heart disease Father   . Heart disease Sister   . Heart disease Brother   . Breast cancer Paternal Aunt   . Heart disease Son      Home Medications:   Prior to Admission medications   Medication Sig Start Date End Date Taking? Authorizing Provider  albuterol (PROVENTIL HFA;VENTOLIN HFA) 108 (90 BASE) MCG/ACT inhaler Inhale 2 puffs into the lungs every 6 (six) hours as needed for wheezing or shortness of breath. 05/28/15  Yes Annita Brod, MD  BELSOMRA 15 MG TABS Take 1 tablet by mouth at bedtime as needed (insomnia). for sleep 12/06/17  Yes [provider]  budesonide-formoterol (SYMBICORT) 160-4.5 MCG/ACT inhaler Inhale 2 puffs into the lungs 2 (two) times daily.   Yes [provider]  carvedilol (COREG)  3.125 MG tablet Take 1 tablet (3.125 mg total) by mouth 2 (two) times daily with a meal. 05/28/15  Yes Annita Brod, MD  cholecalciferol (VITAMIN D) 1000 UNITS tablet Take 1,000 Units by mouth daily.   Yes [provider]  levothyroxine (SYNTHROID, LEVOTHROID) 75 MCG tablet Take 75 mcg by mouth daily before breakfast.   Yes [provider]  lisinopril (PRINIVIL,ZESTRIL) 20 MG tablet Take 20 mg by mouth daily.   Yes [provider]  Nutritional Supplements (ESTROVEN PO) Take 1 tablet by mouth every evening.   Yes [provider]  Potassium Gluconate 595 MG CAPS Take 1 capsule by mouth daily.   Yes [provider]  pravastatin (PRAVACHOL) 80  MG tablet Take 80 mg by mouth at bedtime. 11/27/19  Yes [provider]  traMADol (ULTRAM) 50 MG tablet Take 50 mg by mouth 4 (four) times daily as needed for moderate pain or severe pain.   Yes [provider]  vitamin E 400 UNIT capsule Take 400 Units by mouth daily.   Yes [provider]  celecoxib (CELEBREX) 200 MG capsule Take 1 capsule (200 mg total) by mouth 2 (two) times daily. Patient not taking: Reported on 02/09/2020 12/06/17   Isla Pence, MD  HYDROcodone-acetaminophen (NORCO/VICODIN) 5-325 MG tablet Take 1 tablet by mouth every 4 (four) hours as needed. 12/06/17   Isla Pence, MD  Omega-3 Fatty Acids (FISH OIL) 1000 MG CAPS Take 1,000 mg by mouth daily. Patient not taking: Reported on 02/09/2020    [provider]     Allergies:    No Known Allergies   Physical Exam:   Vitals  Blood pressure 111/63, pulse 89, temperature 98.6 F (37 C), temperature source Oral, resp. rate 18, height $RemoveBe'5\' 4"'SxYyPphlm$  (1.626 m), weight 65.1 kg, SpO2 96 %.  Physical Examination: General appearance - alert, ILL appearing, conversational dyspnea  mental status - alert, oriented to person, place, and time,  Eyes - sclera anicteric Neck - supple, no JVD elevation , Chest -diminished  in bases, scattered rhonchi in right lung fields , wheezing more on the left heart - S1 and S2 normal, regular  Abdomen - soft, nontender, nondistended, no masses or organomegaly Neurological - screening mental status exam normal, neck supple without rigidity, cranial nerves II through XII intact, DTR's normal and symmetric Extremities - no pedal edema noted, intact peripheral pulses  Skin - warm, dry     Data Review:    CBC Recent Labs  Lab 02/09/20 0416  WBC 18.1*  HGB 18.5*  HCT 55.6*  PLT 280  MCV 96.0  MCH 32.0  MCHC 33.3  RDW 12.1  LYMPHSABS 0.4*  MONOABS 0.7  EOSABS 0.0  BASOSABS 0.1   ------------------------------------------------------------------------------------------------------------------  Chemistries  Recent Labs  Lab 02/09/20 0416  NA 131*  K 3.6  CL 91*  CO2 23  GLUCOSE 192*  BUN 15  CREATININE 0.92  CALCIUM 9.6  AST 18  ALT 16  ALKPHOS 83  BILITOT 0.7   ------------------------------------------------------------------------------------------------------------------ estimated creatinine clearance is 49.1 mL/min (by C-G formula based on SCr of 0.92 mg/dL). ------------------------------------------------------------------------------------------------------------------ No results for input(s): TSH, T4TOTAL, T3FREE, THYROIDAB in the last 72 hours.  Invalid input(s): FREET3   Coagulation profile Recent Labs  Lab 02/09/20 0416  INR 1.2   ------------------------------------------------------------------------------------------------------------------- No results for input(s): DDIMER in the last 72 hours. -------------------------------------------------------------------------------------------------------------------  Cardiac Enzymes No results for input(s): CKMB, TROPONINI, MYOGLOBIN in the last 168 hours.  Invalid input(s):  CK ------------------------------------------------------------------------------------------------------------------    Component Value Date/Time   BNP 195.9 (H) 05/27/2015 1640     ---------------------------------------------------------------------------------------------------------------  Urinalysis    Component Value Date/Time   COLORURINE YELLOW 02/09/2020 0355   APPEARANCEUR HAZY (A) 02/09/2020 0355   LABSPEC 1.024 02/09/2020 0355   PHURINE 5.0 02/09/2020 0355   GLUCOSEU 150 (A) 02/09/2020 0355   HGBUR LARGE (A) 02/09/2020 0355   BILIRUBINUR NEGATIVE 02/09/2020 0355   KETONESUR 5 (A) 02/09/2020 0355   PROTEINUR 100 (A) 02/09/2020 0355   NITRITE NEGATIVE 02/09/2020 0355   LEUKOCYTESUR TRACE (A) 02/09/2020 0355    ----------------------------------------------------------------------------------------------------------------   Imaging Results:    DG Chest Port 1 View  Result Date: 02/09/2020 CLINICAL DATA:  Questionable sepsis. EXAM: PORTABLE  CHEST 1 VIEW COMPARISON:  10/09/2019 FINDINGS: Chronic cardiomegaly. Airspace disease at the right upper lobe at least. Left mid pulmonary scarring that is mild. No edema, effusion, or pneumothorax. Artifact from EKG leads. IMPRESSION: 1. Pneumonia in the right upper lobe at least. 2. Chronic cardiomegaly. Electronically Signed   By: Monte Fantasia M.D.   On: 02/09/2020 04:57    Radiological Exams on Admission: DG Chest Port 1 View  Result Date: 02/09/2020 CLINICAL DATA:  Questionable sepsis. EXAM: PORTABLE CHEST 1 VIEW COMPARISON:  10/09/2019 FINDINGS: Chronic cardiomegaly. Airspace disease at the right upper lobe at least. Left mid pulmonary scarring that is mild. No edema, effusion, or pneumothorax. Artifact from EKG leads. IMPRESSION: 1. Pneumonia in the right upper lobe at least. 2. Chronic cardiomegaly. Electronically Signed   By: Monte Fantasia M.D.   On: 02/09/2020 04:57    DVT Prophylaxis -SCD  /HEPARIN AM Labs  Ordered, also please review Full Orders  Family Communication: Admission, patients condition and plan of care including tests being ordered have been discussed with the patient and HUSBAND* who indicate understanding and agree with the plan   Code Status - Full Code  Likely DC to  home after resolution of sepsis pathophysiology  Condition   stable  Roxan Hockey M.D on 02/09/2020 at 7:18 PM Go to [email or web address] -  for contact info  [facility] - Office  [phone]

## 2020-02-10 ENCOUNTER — Inpatient Hospital Stay (HOSPITAL_COMMUNITY): Payer: 59

## 2020-02-10 DIAGNOSIS — I340 Nonrheumatic mitral (valve) insufficiency: Secondary | ICD-10-CM

## 2020-02-10 LAB — GLUCOSE, CAPILLARY
Glucose-Capillary: 161 mg/dL — ABNORMAL HIGH (ref 70–99)
Glucose-Capillary: 167 mg/dL — ABNORMAL HIGH (ref 70–99)
Glucose-Capillary: 182 mg/dL — ABNORMAL HIGH (ref 70–99)
Glucose-Capillary: 168 mg/dL — ABNORMAL HIGH (ref 70–99)

## 2020-02-10 LAB — BASIC METABOLIC PANEL
Anion gap: 12 (ref 5–15)
BUN: 19 mg/dL (ref 8–23)
CO2: 25 mmol/L (ref 22–32)
Calcium: 8.6 mg/dL — ABNORMAL LOW (ref 8.9–10.3)
Chloride: 94 mmol/L — ABNORMAL LOW (ref 98–111)
Creatinine, Ser: 0.8 mg/dL (ref 0.44–1.00)
GFR calc Af Amer: >60 mL/min (ref >60)
GFR calc non Af Amer: >60 mL/min (ref >60)
Glucose, Bld: 163 mg/dL — ABNORMAL HIGH (ref 70–99)
Potassium: 3.5 mmol/L (ref 3.5–5.1)
Sodium: 131 mmol/L — ABNORMAL LOW (ref 135–145)

## 2020-02-10 LAB — URINE CULTURE: Culture: NO GROWTH

## 2020-02-10 LAB — ECHOCARDIOGRAM COMPLETE
Area-P 1/2: 5.02 cm2
Height: 64 in
S' Lateral: 4.69 cm
Weight: 2297.6 oz

## 2020-02-10 LAB — HEMOGLOBIN A1C
Hgb A1c MFr Bld: 6 % — ABNORMAL HIGH (ref 4.8–5.6)
Mean Plasma Glucose: 125.5 mg/dL

## 2020-02-10 LAB — CBC
HCT: 42.9 % (ref 36.0–46.0)
Hemoglobin: 14.3 g/dL (ref 12.0–15.0)
MCH: 32 pg (ref 26.0–34.0)
MCHC: 33.3 g/dL (ref 30.0–36.0)
MCV: 96 fL (ref 80.0–100.0)
Platelets: 268 10*3/uL (ref 150–400)
RBC: 4.47 MIL/uL (ref 3.87–5.11)
RDW: 12.1 % (ref 11.5–15.5)
WBC: 20.5 10*3/uL — ABNORMAL HIGH (ref 4.0–10.5)
nRBC: 0 % (ref 0.0–0.2)

## 2020-02-10 MED ORDER — ACETAMINOPHEN 325 MG PO TABS
650.0000 mg | ORAL_TABLET | Freq: Four times a day (QID) | ORAL | Status: DC | PRN
  Administered 2020-02-10 – 2020-02-12 (×2): 650 mg via ORAL
  Filled 2020-02-10 (×2): qty 2

## 2020-02-10 MED ORDER — HYDROCODONE-ACETAMINOPHEN 5-325 MG PO TABS
1.0000 | ORAL_TABLET | Freq: Four times a day (QID) | ORAL | Status: DC | PRN
  Administered 2020-02-11: 1 via ORAL
  Filled 2020-02-10: qty 1

## 2020-02-10 MED ORDER — NICOTINE 14 MG/24HR TD PT24
14.0000 mg | MEDICATED_PATCH | Freq: Every day | TRANSDERMAL | Status: DC
  Administered 2020-02-10 – 2020-02-12 (×3): 14 mg via TRANSDERMAL
  Filled 2020-02-10 (×3): qty 1

## 2020-02-10 MED ORDER — TRAMADOL HCL 50 MG PO TABS
50.0000 mg | ORAL_TABLET | Freq: Four times a day (QID) | ORAL | Status: DC | PRN
  Administered 2020-02-10 – 2020-02-11 (×2): 50 mg via ORAL
  Filled 2020-02-10 (×2): qty 1

## 2020-02-10 MED ORDER — SODIUM CHLORIDE 0.9 % IV SOLN: INTRAVENOUS | Status: DC

## 2020-02-10 NOTE — Progress Notes (Signed)
   02/10/20 1021  Assess: MEWS Score  Pulse Rate (!) 115 (provider aware, at bedside)  SpO2 (!) 87 % (provider at bedside assessed O2 sats )  O2 Device Room Air  Patient Activity (if Appropriate) Ambulating  Assess: if the MEWS score is Yellow or Red  Were vital signs taken at a resting state? Yes  Focused Assessment No change from prior assessment  Early Detection of Sepsis Score *See Row Information* Low  MEWS guidelines implemented *See Row Information* No, vital signs rechecked  Treat  Complains of Fever  Interventions Medication (see MAR)  Dr. Denton Brick at bedside, aware of MEWS color change. Temp elevated and HR 110s-120 range. Stated okay, tylenol given for elevated temp. Nursing to monitor.

## 2020-02-10 NOTE — Progress Notes (Signed)
Patient Demographics:    Penny Hicks, is a 69 y.o. female, DOB - [birth year], DJM:[id number]  Admit date - 02/09/2020   Admitting Physician Reubin Milan, MD  Outpatient Primary MD for the patient is Sharilyn Sites, MD  LOS - 1   Chief Complaint  Patient presents with  . Fever        Subjective:    Penny Hicks today has   no emesis,  No chest pain,   -Husband at bedside, cough persist, O2 sats down to 87% on room air, -Shortness of breath persist, -Patient complains of significant fatigue -T-max 100.3  Assessment  & Plan :    Principal Problem:   Sepsis due to pneumonia [facility]) Active Problems:   Acute respiratory failure with hypoxia due to PNA/COPD   Chronic HFrEF (heart failure with reduced ejection fraction) ([place])   TOBACCO ABUSE   Essential hypertension  Brief Summary:- 69 y.o. female will come with past medical history relevant for HTN, hypothyroidism, HLD and COPD who is not vaccinated against COVID-19 infection admitted on 02/09/2020 due to  sepsis secondary to pneumonia  and possible UTI requiring IV antibiotics and IV fluids, hypoxic respiratory failure requiring supplemental oxygen and IV steroids   A/p 1)Severe sepsis due to pneumonia--patient met sepsis criteria on admission with fevers, tachypnea, tachycardia, hypoxia and elevated lactic acid in the setting of right-sided pneumonia -Lactic acid improved to 1.9 from 2.3 after IV fluids -Covid test negative - Legionella pending -Continue IV Rocephin and azithromycin -Blood cultures pending --Hypoxia ,tachypnea tachycardia persist -T-max 100.3 -WBC trending up partly due to steroids  2)CAP/right-sided pneumonia--- patient has had emesis, ???  Aspiration related -Treat as above #1 -Mucolytics & bronchodilators as ordered  3)HFrEF--patient with history of chronic systolic dysfunction CHF, last known EF 40 to  45% -Given aggressive IV fluids for sepsis protocol -Check echo,  -de-escalate IV fluids when sepsis pathophysiology improves  4) COPD exacerbation and  tobacco abuse--- COPD exacerbation noted, patient is not interested in smoking cessation, C/n  IV steroids, bronchodilators and mucolytics as ordered  5) vomiting or diarrhea--- symptomatic and supportive treatment at this time, -GI symptoms have abated  6) hypothyroidism----continue levothyroxine  7) hyperglycemia---  A1c 6.0, -, Use Novolog/Humalog Sliding scale insulin with Accu-Cheks/Fingersticks as ordered   8) possible UTI--- IV Rocephin pending urine culture  9) hyponatremia--- ??  SIADH from pneumonia versus dehydration in setting of vomiting and diarrhea-- -sodium is stable at 131 --She did receive IV fluids initially  10) HTN continue Coreg and lisinopril  11) acute hypoxic respiratory failure--secondary to #1 #2 #4 above--treat as above  Disposition/Need for in-Hospital Stay- patient unable to be discharged at this time due to --- sepsis secondary to pneumonia on and possible UTI requiring IV antibiotics and IV fluids, hypoxic respiratory failure requiring supplemental oxygen and IV steroids  Status is: Inpatient  Remains inpatient appropriate because: sepsis secondary to pneumonia on and possible UTI requiring IV antibiotics and IV fluids, hypoxic respiratory failure requiring supplemental oxygen and IV steroids   Dispo: The patient is from: Home  Anticipated d/c is to: Home  Anticipated d/c date is: 3 days  Patient currently is not medically stable to d/c. Barriers: Not Clinically Stable-  sepsis secondary to pneumonia  and possible UTI requiring IV antibiotics and IV fluids, hypoxic respiratory failure requiring supplemental oxygen and IV steroids  Code Status : FULL code  Family Communication:    (patient is alert, awake and coherent)  -Discussed with husband at  bedside  Consults  :  na  DVT Prophylaxis  :   - Heparin - SCDs   Lab Results  Component Value Date   PLT 268 02/10/2020    Inpatient Medications  Scheduled Meds: . aspirin EC  325 mg Oral Q breakfast  . atorvastatin  40 mg Oral Daily  . carvedilol  3.125 mg Oral BID WC  . celecoxib  200 mg Oral Daily  . cholecalciferol  1,000 Units Oral Daily  . guaiFENesin  600 mg Oral BID  . heparin  5,000 Units Subcutaneous Q8H  . insulin aspart  0-5 Units Subcutaneous QHS  . insulin aspart  0-6 Units Subcutaneous TID WC  . ipratropium-albuterol  3 mL Nebulization TID  . levothyroxine  75 mcg Oral QAC breakfast  . lisinopril  20 mg Oral Daily  . methylPREDNISolone (SOLU-MEDROL) injection  40 mg Intravenous Q12H  . sodium chloride flush  3 mL Intravenous Q12H  . traZODone  100 mg Oral QHS  . vitamin E  400 Units Oral Daily   Continuous Infusions: . sodium chloride Stopped (02/09/20 0852)  . sodium chloride 250 mL (02/10/20 0030)  . azithromycin 500 mg (02/10/20 0530)  . cefTRIAXone (ROCEPHIN)  IV 2 g (02/09/20 1957)   PRN Meds:.sodium chloride, acetaminophen, albuterol, HYDROcodone-acetaminophen, ondansetron **OR** ondansetron (ZOFRAN) IV, polyethylene glycol, sodium chloride flush, traMADol, zolpidem    Anti-infectives (From admission, onward)   Start     Dose/Rate Route Frequency Ordered Stop   02/09/20 2200  cefTRIAXone (ROCEPHIN) 2 g in sodium chloride 0.9 % 100 mL IVPB        2 g 200 mL/hr over 30 Minutes Intravenous Every 24 hours 02/09/20 1921     02/09/20 0515  azithromycin (ZITHROMAX) 500 mg in sodium chloride 0.9 % 250 mL IVPB        500 mg 250 mL/hr over 60 Minutes Intravenous Every 24 hours 02/09/20 0500     02/09/20 0400  cefTRIAXone (ROCEPHIN) 1 g in sodium chloride 0.9 % 100 mL IVPB        1 g 200 mL/hr over 30 Minutes Intravenous  Once 02/09/20 0355 02/09/20 0500        Objective:   Vitals:   02/10/20 0700 02/10/20 0800 02/10/20 1021 02/10/20 1026  BP:   130/78  124/67  Pulse:  66 (!) 115 97  Resp:  18  20  Temp:  100.3 F (37.9 C)  99.1 F (37.3 C)  TempSrc:  Oral    SpO2: 95% 100% (!) 87% 92%  Weight:      Height:        Wt Readings from Last 3 Encounters:  02/09/20 65.1 kg  02/06/18 74.4 kg  01/02/18 73.9 kg     Intake/Output Summary (Last 24 hours) at 02/10/2020 1129 Last data filed at 02/10/2020 0901 Gross per 24 hour  Intake 1365.31 ml  Output --  Net 1365.31 ml     Physical Exam  Gen:- Awake Alert, some conversational dyspnea HEENT:- [place].AT, No sclera icterus Neck-Supple Neck,No JVD,.  Lungs-diminished especially on the right with scattered rhonchi and few wheezes CV- S1, S2 normal, regular , tachycardic Abd-  +ve B.Sounds, Abd Soft, No tenderness,    Extremity/Skin:- No  edema, pedal pulses present  Psych-affect is appropriate, oriented x3 Neuro-generalized weakness , no new focal deficits, no tremors   Data Review:   Micro Results Recent Results (from the past 240 hour(s))  SARS Coronavirus 2 by RT PCR (hospital order, performed in [facility] hospital lab) Nasopharyngeal Nasopharyngeal Swab     Status: None   Collection Time: 02/09/20  1:01 AM   Specimen: Nasopharyngeal Swab  Result Value Ref Range Status   SARS Coronavirus 2 NEGATIVE NEGATIVE Final    Comment: (NOTE) SARS-CoV-2 target nucleic acids are NOT DETECTED.  The SARS-CoV-2 RNA is generally detectable in upper and lower respiratory specimens during the acute phase of infection. The lowest concentration of SARS-CoV-2 viral copies this assay can detect is 250 copies / mL. A negative result does not preclude SARS-CoV-2 infection and should not be used as the sole basis for treatment or other patient management decisions.  A negative result may occur with improper specimen collection / handling, submission of specimen other than nasopharyngeal swab, presence of viral mutation(s) within the areas targeted by this assay, and inadequate number of  viral copies (<250 copies / mL). A negative result must be combined with clinical observations, patient history, and epidemiological information.  Fact Sheet for Patients:   StrictlyIdeas.no  Fact Sheet for Healthcare Providers: BankingDealers.co.za  This test is not yet approved or  cleared by the [place] FDA and has been authorized for detection and/or diagnosis of SARS-CoV-2 by FDA under an Emergency Use Authorization (EUA).  This EUA will remain in effect (meaning this test can be used) for the duration of the COVID-19 declaration under Section 564(b)(1) of the Act, 21 U.S.C. section 360bbb-3(b)(1), unless the authorization is terminated or revoked sooner.  Performed at [facility], [address] [place], [place] [address]   Blood Culture (routine x 2)     Status: None (Preliminary result)   Collection Time: 02/09/20  4:16 AM   Specimen: BLOOD RIGHT HAND  Result Value Ref Range Status   Specimen Description BLOOD RIGHT HAND  Final   Special Requests   Final    BOTTLES DRAWN AEROBIC AND ANAEROBIC Blood Culture adequate volume   Culture   Final    NO GROWTH < 24 HOURS Performed at [facility], [address], [place], [place] [address]    Report Status PENDING  Incomplete  Blood Culture (routine x 2)     Status: None (Preliminary result)   Collection Time: 02/09/20  4:20 AM   Specimen: BLOOD  Result Value Ref Range Status   Specimen Description BLOOD RIGHT ANTECUBITAL  Final   Special Requests   Final    BOTTLES DRAWN AEROBIC AND ANAEROBIC Blood Culture adequate volume   Culture   Final    NO GROWTH < 24 HOURS Performed at [facility], [address] [place],  [address]    Report Status PENDING  Incomplete    Radiology Reports DG Chest Port 1 View  Result Date: 02/09/2020 CLINICAL DATA:  Questionable sepsis. EXAM: PORTABLE CHEST 1 VIEW COMPARISON:  10/09/2019 FINDINGS: Chronic cardiomegaly. Airspace  disease at the right upper lobe at least. Left mid pulmonary scarring that is mild. No edema, effusion, or pneumothorax. Artifact from EKG leads. IMPRESSION: 1. Pneumonia in the right upper lobe at least. 2. Chronic cardiomegaly. Electronically Signed   By: Monte Fantasia M.D.   On: 02/09/2020 04:57     CBC Recent Labs  Lab 02/09/20 0416 02/10/20 0609  WBC 18.1* 20.5*  HGB 18.5* 14.3  HCT 55.6*  42.9  PLT 280 268  MCV 96.0 96.0  MCH 32.0 32.0  MCHC 33.3 33.3  RDW 12.1 12.1  LYMPHSABS 0.4*  --   MONOABS 0.7  --   EOSABS 0.0  --   BASOSABS 0.1  --     Chemistries  Recent Labs  Lab 02/09/20 0416 02/10/20 0609  NA 131* 131*  K 3.6 3.5  CL 91* 94*  CO2 23 25  GLUCOSE 192* 163*  BUN 15 19  CREATININE 0.92 0.80  CALCIUM 9.6 8.6*  AST 18  --   ALT 16  --   ALKPHOS 83  --   BILITOT 0.7  --    ------------------------------------------------------------------------------------------------------------------ No results for input(s): CHOL, HDL, LDLCALC, TRIG, CHOLHDL, LDLDIRECT in the last 72 hours.  Lab Results  Component Value Date   HGBA1C 6.0 (H) 02/09/2020   ------------------------------------------------------------------------------------------------------------------ No results for input(s): TSH, T4TOTAL, T3FREE, THYROIDAB in the last 72 hours.  Invalid input(s): FREET3 ------------------------------------------------------------------------------------------------------------------ No results for input(s): VITAMINB12, FOLATE, FERRITIN, TIBC, IRON, RETICCTPCT in the last 72 hours.  Coagulation profile Recent Labs  Lab 02/09/20 0416  INR 1.2    No results for input(s): DDIMER in the last 72 hours.  Cardiac Enzymes No results for input(s): CKMB, TROPONINI, MYOGLOBIN in the last 168 hours.  Invalid input(s): CK ------------------------------------------------------------------------------------------------------------------    Component Value Date/Time    BNP 195.9 (H) 05/27/2015 1640     Roxan Hockey M.D on 02/10/2020 at 11:29 AM  Go to [email or web address] - for contact info  [facility] - Office  [phone]

## 2020-02-10 NOTE — Progress Notes (Signed)
*  PRELIMINARY RESULTS* Echocardiogram 2D Echocardiogram has been performed.  Leavy Cella 02/10/2020, 11:19 AM

## 2020-02-11 ENCOUNTER — Inpatient Hospital Stay (HOSPITAL_COMMUNITY): Payer: 59

## 2020-02-11 LAB — BASIC METABOLIC PANEL
Anion gap: 11 (ref 5–15)
BUN: 18 mg/dL (ref 8–23)
CO2: 28 mmol/L (ref 22–32)
Calcium: 8.8 mg/dL — ABNORMAL LOW (ref 8.9–10.3)
Chloride: 94 mmol/L — ABNORMAL LOW (ref 98–111)
Creatinine, Ser: 0.79 mg/dL (ref 0.44–1.00)
GFR calc Af Amer: >60 mL/min (ref >60)
GFR calc non Af Amer: >60 mL/min (ref >60)
Glucose, Bld: 169 mg/dL — ABNORMAL HIGH (ref 70–99)
Potassium: 4.1 mmol/L (ref 3.5–5.1)
Sodium: 133 mmol/L — ABNORMAL LOW (ref 135–145)

## 2020-02-11 LAB — GLUCOSE, CAPILLARY
Glucose-Capillary: 166 mg/dL — ABNORMAL HIGH (ref 70–99)
Glucose-Capillary: 167 mg/dL — ABNORMAL HIGH (ref 70–99)
Glucose-Capillary: 179 mg/dL — ABNORMAL HIGH (ref 70–99)
Glucose-Capillary: 198 mg/dL — ABNORMAL HIGH (ref 70–99)

## 2020-02-11 LAB — LEGIONELLA PNEUMOPHILA SEROGP 1 UR AG: L. pneumophila Serogp 1 Ur Ag: POSITIVE — AB

## 2020-02-11 MED ORDER — AZITHROMYCIN 250 MG PO TABS
500.0000 mg | ORAL_TABLET | Freq: Every day | ORAL | Status: DC
  Administered 2020-02-12: 500 mg via ORAL
  Filled 2020-02-11: qty 2

## 2020-02-11 NOTE — Progress Notes (Signed)
Patient Demographics:    Penny Hicks, is a 69 y.o. female, DOB - [birth year], UYQ:[id number]  Admit date - 02/09/2020   Admitting Physician Reubin Milan, MD  Outpatient Primary MD for the patient is Sharilyn Sites, MD  LOS - 2   Chief Complaint  Patient presents with  . Fever        Subjective:    Penny Hicks today has   no emesis,  No chest pain,   Daughter at bedside,  --Hypoxia persist O2 sats 87% on room air at rest,  --Cough and dyspnea persist,  -Becomes very dyspneic with minimal activity   --No further fevers  Assessment  & Plan :    Principal Problem:   Sepsis due to pneumonia [facility]) Active Problems:   Acute respiratory failure with hypoxia due to PNA/COPD   Chronic HFrEF (heart failure with reduced ejection fraction) ([place])   TOBACCO ABUSE   Essential hypertension  Brief Summary:- 69 y.o. female will come with past medical history relevant for HTN, hypothyroidism, HLD and COPD who is not vaccinated against COVID-19 infection admitted on 02/09/2020 due to  sepsis secondary to pneumonia  and possible UTI requiring IV antibiotics and IV fluids, hypoxic respiratory failure requiring supplemental oxygen and IV steroids   A/p 1)Severe sepsis due to pneumonia--patient met sepsis criteria on admission with fevers, tachypnea, tachycardia, hypoxia and elevated lactic acid in the setting of right-sided pneumonia -Lactic acid improved to 1.9 from 2.3 after IV fluids -Covid test negative - Legionella pending -Continue IV Rocephin and azithromycin -Blood cultures NGTD --Hypoxia  And tachycardia persist -Repeat chest x-ray on 02/11/2020 consistent with worsening right-sided pneumonia -WBC trending up partly due to steroids -Consider discharge home on home O2 if dyspnea on exertion improves  2)CAP/right-sided pneumonia--- patient has had emesis, ???  Aspiration related -Treat as  above #1 -Mucolytics & bronchodilators as ordered  3)HFrEF--patient with history of chronic systolic dysfunction CHF, echo from 02/10/2020 with EF 30 to 35% from   EF of 40 to 45% back in December 2016 -Continue Coreg and lisinopril  4) COPD exacerbation and  tobacco abuse--- COPD exacerbation noted, patient is not interested in smoking cessation, C/n  IV steroids, bronchodilators and mucolytics as ordered  5) vomiting or diarrhea--- symptomatic and supportive treatment at this time, -GI symptoms have abated  6) hypothyroidism----continue levothyroxine  7) hyperglycemia---  A1c 6.0, -, Use Novolog/Humalog Sliding scale insulin with Accu-Cheks/Fingersticks as ordered   8) acute hypoxic respiratory failure--secondary to #1 #2 #4 above--treat as above  9) hyponatremia--- ??  SIADH from pneumonia versus dehydration in setting of vomiting and diarrhea-- -sodium is stable at 131 --She did receive IV fluids initially  10) HTN continue Coreg and lisinopril   Disposition/Need for in-Hospital Stay- patient unable to be discharged at this time due to --- sepsis secondary to pneumonia on and possible UTI requiring IV antibiotics and IV fluids, hypoxic respiratory failure requiring supplemental oxygen and IV steroids  Status is: Inpatient  Remains inpatient appropriate because: sepsis secondary to pneumonia on and possible UTI requiring IV antibiotics and IV fluids, hypoxic respiratory failure requiring supplemental oxygen and IV steroids   Dispo: The patient is from: Home  Anticipated d/c is to: Home  Anticipated d/c date is: 3  days  Patient currently is not medically stable to d/c. Barriers: Not Clinically Stable-  sepsis secondary to pneumonia  and possible UTI requiring IV antibiotics and IV fluids, hypoxic respiratory failure requiring supplemental oxygen and IV steroids  Code Status : FULL code  Family Communication:    (patient is  alert, awake and coherent)  -Discussed with husband and daughter at bedside  Consults  :  na  DVT Prophylaxis  :   - Heparin - SCDs   Lab Results  Component Value Date   PLT 268 02/10/2020    Inpatient Medications  Scheduled Meds: . aspirin EC  325 mg Oral Q breakfast  . atorvastatin  40 mg Oral Daily  . [START ON 02/12/2020] azithromycin  500 mg Oral Daily  . carvedilol  3.125 mg Oral BID WC  . celecoxib  200 mg Oral Daily  . cholecalciferol  1,000 Units Oral Daily  . guaiFENesin  600 mg Oral BID  . heparin  5,000 Units Subcutaneous Q8H  . insulin aspart  0-5 Units Subcutaneous QHS  . insulin aspart  0-6 Units Subcutaneous TID WC  . ipratropium-albuterol  3 mL Nebulization TID  . levothyroxine  75 mcg Oral QAC breakfast  . lisinopril  20 mg Oral Daily  . methylPREDNISolone (SOLU-MEDROL) injection  40 mg Intravenous Q12H  . nicotine  14 mg Transdermal Daily  . sodium chloride flush  3 mL Intravenous Q12H  . traZODone  100 mg Oral QHS  . vitamin E  400 Units Oral Daily   Continuous Infusions: . sodium chloride Stopped (02/09/20 0852)  . sodium chloride 250 mL (02/10/20 0030)  . sodium chloride 50 mL/hr at 02/11/20 1159  . cefTRIAXone (ROCEPHIN)  IV 2 g (02/10/20 2206)   PRN Meds:.sodium chloride, acetaminophen, albuterol, HYDROcodone-acetaminophen, ondansetron **OR** ondansetron (ZOFRAN) IV, polyethylene glycol, sodium chloride flush, traMADol, zolpidem    Anti-infectives (From admission, onward)   Start     Dose/Rate Route Frequency Ordered Stop   02/12/20 1000  azithromycin (ZITHROMAX) tablet 500 mg        500 mg Oral Daily 02/11/20 1313     02/09/20 2200  cefTRIAXone (ROCEPHIN) 2 g in sodium chloride 0.9 % 100 mL IVPB        2 g 200 mL/hr over 30 Minutes Intravenous Every 24 hours 02/09/20 1921     02/09/20 0515  azithromycin (ZITHROMAX) 500 mg in sodium chloride 0.9 % 250 mL IVPB  Status:  Discontinued        500 mg 250 mL/hr over 60 Minutes Intravenous Every 24  hours 02/09/20 0500 02/11/20 1313   02/09/20 0400  cefTRIAXone (ROCEPHIN) 1 g in sodium chloride 0.9 % 100 mL IVPB        1 g 200 mL/hr over 30 Minutes Intravenous  Once 02/09/20 0355 02/09/20 0500        Objective:   Vitals:   02/11/20 0602 02/11/20 0730 02/11/20 0909 02/11/20 1321  BP: 135/81  (!) 138/57   Pulse: 100  62   Resp: 20     Temp: 99.9 F (37.7 C)     TempSrc:      SpO2: 95% 94%  95%  Weight:      Height:        Wt Readings from Last 3 Encounters:  02/09/20 65.1 kg  02/06/18 74.4 kg  01/02/18 73.9 kg     Intake/Output Summary (Last 24 hours) at 02/11/2020 1345 Last data filed at 02/11/2020 0711 Gross per 24 hour  Intake 1401.57 ml  Output --  Net 1401.57 ml     Physical Exam  Gen:- Awake Alert, some conversational dyspnea HEENT:- [place].AT, No sclera icterus Nose- [place] 2L/min Neck-Supple Neck,No JVD,.  Lungs-diminished especially on the right with scattered rhonchi and few wheezes CV- S1, S2 normal, regular , tachycardic Abd-  +ve B.Sounds, Abd Soft, No tenderness,    Extremity/Skin:- No  edema, pedal pulses present  Psych-affect is appropriate, oriented x3 Neuro-generalized weakness , no new focal deficits, no tremors   Data Review:   Micro Results Recent Results (from the past 240 hour(s))  SARS Coronavirus 2 by RT PCR (hospital order, performed in [facility] hospital lab) Nasopharyngeal Nasopharyngeal Swab     Status: None   Collection Time: 02/09/20  1:01 AM   Specimen: Nasopharyngeal Swab  Result Value Ref Range Status   SARS Coronavirus 2 NEGATIVE NEGATIVE Final    Comment: (NOTE) SARS-CoV-2 target nucleic acids are NOT DETECTED.  The SARS-CoV-2 RNA is generally detectable in upper and lower respiratory specimens during the acute phase of infection. The lowest concentration of SARS-CoV-2 viral copies this assay can detect is 250 copies / mL. A negative result does not preclude SARS-CoV-2 infection and should not be used as the sole basis  for treatment or other patient management decisions.  A negative result may occur with improper specimen collection / handling, submission of specimen other than nasopharyngeal swab, presence of viral mutation(s) within the areas targeted by this assay, and inadequate number of viral copies (<250 copies / mL). A negative result must be combined with clinical observations, patient history, and epidemiological information.  Fact Sheet for Patients:   StrictlyIdeas.no  Fact Sheet for Healthcare Providers: BankingDealers.co.za  This test is not yet approved or  cleared by the [place] FDA and has been authorized for detection and/or diagnosis of SARS-CoV-2 by FDA under an Emergency Use Authorization (EUA).  This EUA will remain in effect (meaning this test can be used) for the duration of the COVID-19 declaration under Section 564(b)(1) of the Act, 21 U.S.C. section 360bbb-3(b)(1), unless the authorization is terminated or revoked sooner.  Performed at [facility], [address] [place], [place] [address]   Urine culture     Status: None   Collection Time: 02/09/20  3:55 AM   Specimen: In/Out Cath Urine  Result Value Ref Range Status   Specimen Description   Final    IN/OUT CATH URINE Performed at [facility], [address] [place], [place] [address]    Special Requests   Final    NONE Performed at [facility], [address], [place], [place] [address]    Culture   Final    NO GROWTH Performed at [facility], [place] [address], [place], [place] [address]    Report Status 02/10/2020 FINAL  Final  Blood Culture (routine x 2)     Status: None (Preliminary result)   Collection Time: 02/09/20  4:16 AM   Specimen: BLOOD RIGHT HAND  Result Value Ref Range Status   Specimen Description BLOOD RIGHT HAND  Final   Special Requests   Final    BOTTLES DRAWN AEROBIC AND ANAEROBIC Blood Culture adequate volume   Culture   Final     NO GROWTH < 24 HOURS Performed at [facility], [address] [place],  [address]    Report Status PENDING  Incomplete  Blood Culture (routine x 2)     Status: None (Preliminary result)   Collection  Time: 02/09/20  4:20 AM   Specimen: BLOOD  Result Value Ref Range Status   Specimen Description BLOOD RIGHT ANTECUBITAL  Final   Special Requests   Final    BOTTLES DRAWN AEROBIC AND ANAEROBIC Blood Culture adequate volume   Culture   Final    NO GROWTH < 24 HOURS Performed at [facility], [address], [place], [place] [address]    Report Status PENDING  Incomplete    Radiology Reports DG Chest 2 View  Result Date: 02/11/2020 CLINICAL DATA:  Chest pain, shortness of breath EXAM: CHEST - 2 VIEW COMPARISON:  02/09/2020 FINDINGS: Stable cardiomegaly. Atherosclerotic calcification of the aortic knob. Worsening right upper lobe pneumonia. Linear scarring or atelectasis within the left lung base. No pleural effusion. No pneumothorax. Chronic compression fracture of T12. IMPRESSION: Worsening right upper lobe pneumonia. Electronically Signed   By: Duanne Guess D.O.   On: 02/11/2020 10:51   DG Chest Port 1 View  Result Date: 02/09/2020 CLINICAL DATA:  Questionable sepsis. EXAM: PORTABLE CHEST 1 VIEW COMPARISON:  10/09/2019 FINDINGS: Chronic cardiomegaly. Airspace disease at the right upper lobe at least. Left mid pulmonary scarring that is mild. No edema, effusion, or pneumothorax. Artifact from EKG leads. IMPRESSION: 1. Pneumonia in the right upper lobe at least. 2. Chronic cardiomegaly. Electronically Signed   By: Marnee Spring M.D.   On: 02/09/2020 04:57   ECHOCARDIOGRAM COMPLETE  Result Date: 02/10/2020    ECHOCARDIOGRAM REPORT   Patient Name:   AMRIT ERCK Date of Exam: 02/10/2020 Medical Rec #:  [id number]         Height:       64.0 in Accession #:    [id number]        Weight:       143.6 lb Date of Birth:  October 09, 1950          BSA:          1.699 m Patient Age:    69  years          BP:           124/67 mmHg Patient Gender: F                 HR:           97 bpm. [facility] Procedure: 2D Echo Indications:    Dyspnea 786.09 / R06.00  History:        Patient has prior history of Echocardiogram examinations, most                 recent 05/25/2015. CHF; Risk Factors:Current Smoker,                 Dyslipidemia and Hypertension. Sepsis due to pneumonia.  Sonographer:    Jeryl Columbia RDCS (AE) Referring Phys: [id number] Nishka Heide IMPRESSIONS  1. Left ventricular ejection fraction, by estimation, is 30 to 35%. The left ventricle has moderately decreased function. The left ventricle demonstrates global hypokinesis. There is severe left ventricular hypertrophy. Left ventricular diastolic parameters are indeterminate.  2. Right ventricular systolic function is normal. The right ventricular size is normal.  3. Left atrial size was moderately dilated.  4. The mitral valve is normal in structure. Mild mitral valve regurgitation. No evidence of mitral stenosis.  5. The aortic valve is tricuspid. Aortic valve regurgitation is not visualized. No aortic stenosis is present.  6. The inferior vena cava is normal in size with greater than 50% respiratory variability, suggesting right  atrial pressure of 3 mmHg. FINDINGS  Left Ventricle: Left ventricular ejection fraction, by estimation, is 30 to 35%. The left ventricle has moderately decreased function. The left ventricle demonstrates global hypokinesis. The left ventricular internal cavity size was normal in size. There is severe left ventricular hypertrophy. Left ventricular diastolic parameters are indeterminate. Indeterminate filling pressures. Right Ventricle: The right ventricular size is normal. No increase in right ventricular wall thickness. Right ventricular systolic function is normal. Left Atrium: Left atrial size was moderately dilated. Right Atrium: Right atrial size was normal in size. Pericardium: There is no  evidence of pericardial effusion. Mitral Valve: The mitral valve is normal in structure. There is mild thickening of the mitral valve leaflet(s). There is mild calcification of the mitral valve leaflet(s). Mild mitral annular calcification. Mild mitral valve regurgitation. No evidence of  mitral valve stenosis. Tricuspid Valve: The tricuspid valve is normal in structure. Tricuspid valve regurgitation is not demonstrated. No evidence of tricuspid stenosis. Aortic Valve: The aortic valve is tricuspid. . There is mild thickening and mild calcification of the aortic valve. Aortic valve regurgitation is not visualized. No aortic stenosis is present. Mild aortic valve annular calcification. There is mild thickening of the aortic valve. There is mild calcification of the aortic valve. Pulmonic Valve: The pulmonic valve was not well visualized. Pulmonic valve regurgitation is not visualized. No evidence of pulmonic stenosis. Aorta: The aortic root is normal in size and structure. Pulmonary Artery: Indeterminate PASP, inadequate TR jet. Venous: The inferior vena cava is normal in size with greater than 50% respiratory variability, suggesting right atrial pressure of 3 mmHg. IAS/Shunts: No atrial level shunt detected by color flow Doppler.  LEFT VENTRICLE PLAX 2D LVIDd:         5.59 cm  Diastology LVIDs:         4.69 cm  LV e' lateral:   11.60 cm/s LV PW:         1.51 cm  LV E/e' lateral: 10.9 LV IVS:        1.50 cm  LV e' medial:    7.51 cm/s LVOT diam:     2.10 cm  LV E/e' medial:  16.9 LV SV:         63 LV SV Index:   37 LVOT Area:     3.46 cm  RIGHT VENTRICLE RV S prime:     11.40 cm/s TAPSE (M-mode): 2.0 cm LEFT ATRIUM             Index       RIGHT ATRIUM           Index LA diam:        4.30 cm 2.53 cm/m  RA Area:     14.10 cm LA Vol (A2C):   49.5 ml 29.13 ml/m RA Volume:   33.70 ml  19.83 ml/m LA Vol (A4C):   58.3 ml 34.31 ml/m LA Biplane Vol: 54.8 ml 32.25 ml/m  AORTIC VALVE LVOT Vmax:   108.30 cm/s LVOT Vmean:   69.654 cm/s LVOT VTI:    0.181 m  AORTA Ao Root diam: 3.80 cm MITRAL VALVE MV Area (PHT): 5.02 cm     SHUNTS MV Decel Time: 151 msec     Systemic VTI:  0.18 m MV E velocity: 127.00 cm/s  Systemic Diam: 2.10 cm Carlyle Dolly MD Electronically signed by Carlyle Dolly MD Signature Date/Time: 02/10/2020/12:08:42 PM    Final      CBC Recent Labs  Lab 02/09/20 8341 02/10/20  0609  WBC 18.1* 20.5*  HGB 18.5* 14.3  HCT 55.6* 42.9  PLT 280 268  MCV 96.0 96.0  MCH 32.0 32.0  MCHC 33.3 33.3  RDW 12.1 12.1  LYMPHSABS 0.4*  --   MONOABS 0.7  --   EOSABS 0.0  --   BASOSABS 0.1  --     Chemistries  Recent Labs  Lab 02/09/20 0416 02/10/20 0609 02/11/20 0702  NA 131* 131* 133*  K 3.6 3.5 4.1  CL 91* 94* 94*  CO2 $Re'23 25 28  'wpJ$ GLUCOSE 192* 163* 169*  BUN $Re'15 19 18  'ezf$ CREATININE 0.92 0.80 0.79  CALCIUM 9.6 8.6* 8.8*  AST 18  --   --   ALT 16  --   --   ALKPHOS 83  --   --   BILITOT 0.7  --   --    ------------------------------------------------------------------------------------------------------------------ No results for input(s): CHOL, HDL, LDLCALC, TRIG, CHOLHDL, LDLDIRECT in the last 72 hours.  Lab Results  Component Value Date   HGBA1C 6.0 (H) 02/09/2020   ------------------------------------------------------------------------------------------------------------------ No results for input(s): TSH, T4TOTAL, T3FREE, THYROIDAB in the last 72 hours.  Invalid input(s): FREET3 ------------------------------------------------------------------------------------------------------------------ No results for input(s): VITAMINB12, FOLATE, FERRITIN, TIBC, IRON, RETICCTPCT in the last 72 hours.  Coagulation profile Recent Labs  Lab 02/09/20 0416  INR 1.2    No results for input(s): DDIMER in the last 72 hours.  Cardiac Enzymes No results for input(s): CKMB, TROPONINI, MYOGLOBIN in the last 168 hours.  Invalid input(s):  CK ------------------------------------------------------------------------------------------------------------------    Component Value Date/Time   BNP 195.9 (H) 05/27/2015 1640    Roxan Hockey M.D on 02/11/2020 at 1:45 PM  Go to [email or web address] - for contact info  [facility] - Office  [phone]

## 2020-02-11 NOTE — Progress Notes (Signed)
Pt off O2. Sat 87. Return O2 to 2 liters. Will notify MD.

## 2020-02-12 LAB — CBC
HCT: 43.3 % (ref 36.0–46.0)
Hemoglobin: 14.1 g/dL (ref 12.0–15.0)
MCH: 31.3 pg (ref 26.0–34.0)
MCHC: 32.6 g/dL (ref 30.0–36.0)
MCV: 96.2 fL (ref 80.0–100.0)
Platelets: 320 10*3/uL (ref 150–400)
RBC: 4.5 MIL/uL (ref 3.87–5.11)
RDW: 12.7 % (ref 11.5–15.5)
WBC: 17.9 10*3/uL — ABNORMAL HIGH (ref 4.0–10.5)
nRBC: 0 % (ref 0.0–0.2)

## 2020-02-12 LAB — GLUCOSE, CAPILLARY
Glucose-Capillary: 147 mg/dL — ABNORMAL HIGH (ref 70–99)
Glucose-Capillary: 227 mg/dL — ABNORMAL HIGH (ref 70–99)

## 2020-02-12 MED ORDER — ATORVASTATIN CALCIUM 40 MG PO TABS
40.0000 mg | ORAL_TABLET | Freq: Every evening | ORAL | 5 refills | Status: DC
Start: 2020-02-12 — End: 2022-10-05

## 2020-02-12 MED ORDER — ALBUTEROL SULFATE HFA 108 (90 BASE) MCG/ACT IN AERS: 2.0000 | INHALATION_SPRAY | RESPIRATORY_TRACT | 3 refills | Status: AC | PRN

## 2020-02-12 MED ORDER — PREDNISONE 20 MG PO TABS: 50.0000 mg | ORAL_TABLET | Freq: Every day | ORAL | Status: DC

## 2020-02-12 MED ORDER — NICOTINE 14 MG/24HR TD PT24: 14.0000 mg | MEDICATED_PATCH | Freq: Every day | TRANSDERMAL | 0 refills | Status: DC

## 2020-02-12 MED ORDER — BUDESONIDE-FORMOTEROL FUMARATE 160-4.5 MCG/ACT IN AERO: 2.0000 | INHALATION_SPRAY | Freq: Two times a day (BID) | RESPIRATORY_TRACT | 12 refills | Status: DC

## 2020-02-12 MED ORDER — GUAIFENESIN ER 600 MG PO TB12
600.0000 mg | ORAL_TABLET | Freq: Two times a day (BID) | ORAL | 0 refills | Status: DC
Start: 2020-02-12 — End: 2022-10-05

## 2020-02-12 MED ORDER — TRAZODONE HCL 100 MG PO TABS: 100.0000 mg | ORAL_TABLET | Freq: Every evening | ORAL | 1 refills | Status: DC | PRN

## 2020-02-12 MED ORDER — PREDNISONE 20 MG PO TABS: 40.0000 mg | ORAL_TABLET | Freq: Every day | ORAL | 0 refills | Status: DC

## 2020-02-12 MED ORDER — IPRATROPIUM-ALBUTEROL 0.5-2.5 (3) MG/3ML IN SOLN: 3.0000 mL | Freq: Two times a day (BID) | RESPIRATORY_TRACT | Status: DC

## 2020-02-12 MED ORDER — ASPIRIN EC 81 MG PO TBEC: 81.0000 mg | DELAYED_RELEASE_TABLET | Freq: Every day | ORAL | 2 refills | Status: AC

## 2020-02-12 MED ORDER — LISINOPRIL 20 MG PO TABS
20.0000 mg | ORAL_TABLET | Freq: Every day | ORAL | 5 refills | Status: DC
Start: 2020-02-12 — End: 2022-10-14

## 2020-02-12 MED ORDER — AZITHROMYCIN 500 MG PO TABS: 500.0000 mg | ORAL_TABLET | Freq: Every day | ORAL | 0 refills | Status: AC

## 2020-02-12 MED ORDER — CARVEDILOL 3.125 MG PO TABS
3.1250 mg | ORAL_TABLET | Freq: Two times a day (BID) | ORAL | 5 refills | Status: DC
Start: 2020-02-12 — End: 2022-10-05

## 2020-02-12 MED ORDER — AZITHROMYCIN 250 MG PO TABS: 500.0000 mg | ORAL_TABLET | Freq: Once | ORAL | Status: DC

## 2020-02-12 MED ORDER — ACETAMINOPHEN 325 MG PO TABS
650.0000 mg | ORAL_TABLET | Freq: Four times a day (QID) | ORAL | 2 refills | Status: DC | PRN
Start: 2020-02-12 — End: 2022-10-05

## 2020-02-12 NOTE — Discharge Summary (Signed)
Penny Hicks, is a 69 y.o. female  DOB June 30, 1950  MRN [id number].  Admission date:  02/09/2020  Admitting Physician  Reubin Milan, MD  Discharge Date:  02/12/2020   Primary MD  Sharilyn Sites, MD  Recommendations for primary care physician for things to follow:   1)Please be sure to take azithromycin 500 mg daily for the next 7 days as prescribed for Legionella pneumonia 2) complete abstinence from tobacco/nicotine strongly advised--please use nicotine patch to help you quit smoking 3)you need oxygen at home at 3 L via nasal cannula continuously while awake and while asleep--- smoking or having open fires around oxygen can cause fire, significant injury and death 4) please follow-up with your primary care physician for recheck and reevaluation in a week or so 5) please complete prednisone 40 mg daily for the next 5 days as prescribed for COPD flareup 6)Avoid ibuprofen/Advil/Aleve/Motrin/Goody Powders/Naproxen/BC powders/Meloxicam/Diclofenac/Indomethacin and other Nonsteroidal anti-inflammatory medications as these will make you more likely to bleed and can cause stomach ulcers, can also cause Kidney problems.   Admission Diagnosis  Blood in stool [K92.1] Sepsis due to pneumonia ([place]) [J18.9, A41.9] Community acquired pneumonia of right upper lobe of lung [J18.9] Sepsis without acute organ dysfunction, due to unspecified organism [facility]) [A41.9]   Discharge Diagnosis  Blood in stool [K92.1] Sepsis due to pneumonia ([place]) [J18.9, A41.9] Community acquired pneumonia of right upper lobe of lung [J18.9] Sepsis without acute organ dysfunction, due to unspecified organism [facility]) [A41.9]    Principal Problem:   Legionella pneumonia ([place]) Active Problems:   Sepsis due to pneumonia ([place])   Acute respiratory failure with hypoxia due to PNA/COPD   Chronic HFrEF (heart failure with reduced ejection fraction)  ([place])   TOBACCO ABUSE   Essential hypertension      Past Medical History:  Diagnosis Date   Arthritis    COPD (chronic obstructive pulmonary disease) ([place])    Hypercholesterolemia    Hypertension    Hypothyroidism     Past Surgical History:  Procedure Laterality Date   CESAREAN SECTION  x 2   HIP PINNING,CANNULATED Right 05/25/2015   Procedure: CANNULATED HIP PINNING;  Surgeon: Dorna Leitz, MD;  Location: [place];  Service: Orthopedics;  Laterality: Right;   TONSILLECTOMY       HPI  from the history and physical done on the day of admission:    Penny Hicks  is a 69 y.o. female will come with past medical history relevant for HTN, hypothyroidism, HLD and COPD who is not vaccinated against COVID-19 infection presents with a 1 week history of fatigue, malaise, fevers, chills, vomiting and diarrhea- --emesis without blood or bile diarrhea without mucus or blood -Additional history obtained from patient's husband at bedside -No pleuritic symptoms no leg pains or leg swellings -No sick contacts at home patient's husband is vaccinated against COVID-19 infection - The ED chest x-ray suggest pneumonia , Covid 19 test negative --=-UA is suggestive of UTI -Lactic acid elevated to 2.3 after IV fluids down to 1.9 -patient is found  to have 18.1 -Glucose is elevated to 192 with a sodium of 131 -Blood cultures obtained and antibiotics initiated  -Patient was admitted due to  sepsis secondary to pneumonia on and possible UTI requiring IV antibiotics and IV fluids, hypoxic respiratory failure requiring supplemental oxygen and IV steroids    Hospital Course:    Brief Summary:- 69 y.o.femalewill come with past medical history relevant for HTN, hypothyroidism, HLD and COPD who is not vaccinated against COVID-19 infection admitted on 02/09/2020 due to sepsis secondary to pneumonia  and possible UTI requiring IV antibiotics and IV fluids,hypoxic respiratory failure requiring  supplemental oxygen and IV steroids   A/p 1)Severesepsis due to Legionella pneumonia--patient met sepsis criteria on admission with fevers, tachypnea, tachycardia, hypoxia and elevated lactic acid in the setting of right-sided pneumonia -Lactic acid improvedto 1.9 from 2.3 after IV fluids -Covid test negative - Legionella Positive -Initially treated with IV Rocephin and azithromycin -Blood cultures NGTD --Hypoxia  And tachycardia persist -Repeat chest x-ray on 02/11/2020 consistent with worsening right-sided pneumonia -WBC trending up partly due to steroids =--Okay to discharge home on azithromycin and on home O2  2)-right-sided pneumonia secondary to Legionella----Treat as above #1 -Mucolytics & bronchodilators as ordered  3)HFrEF--patient with history of chronic systolic dysfunction CHF, echo from 02/10/2020 with EF 30 to 35% from   EF of 40 to 45% back in December 2016 -Continue Coreg and lisinopril  4)COPD exacerbation andtobacco abuse---COPD exacerbation noted,patient is not interested in smoking cessation, Initially treated with IV steroids, bronchodilators and mucolytics as ordered  5)vomiting or diarrhea---symptomatic and supportive treatment at this time, -GI symptoms have abated =-Stable, resolved symptoms  6)hypothyroidism----continue levothyroxine  7)hyperglycemia--- A1c 6.0, -,Use Novolog/Humalog Sliding scale insulin with Accu-Cheks/Fingersticks as ordered  8)acute hypoxic respiratory failure--secondary to #1 #2 #4 above--treat as above  9)hyponatremia--- ??SIADH from pneumonia versus dehydration in setting of vomiting and diarrhea-- -sodium is stable at 133 --She did receive IV fluids initially  10)HTN continue Coreg and lisinopril   Disposition--discharge home on home oxygen   Code Status : FULL code  Family Communication:    (patient is alert, awake and coherent)  -Discussed with husband and daughter at  bedside  Discharge Condition: stable  Follow UP   Follow-up Information    [place]. Call.   Why: Home O2              Diet and Activity recommendation:  As advised  Discharge Instructions    Discharge Instructions    Call MD for:  difficulty breathing, headache or visual disturbances   Complete by: As directed    Call MD for:  persistant dizziness or light-headedness   Complete by: As directed    Call MD for:  persistant nausea and vomiting   Complete by: As directed    Call MD for:  severe uncontrolled pain   Complete by: As directed    Call MD for:  temperature >100.4   Complete by: As directed    Diet - low sodium heart healthy   Complete by: As directed    Discharge instructions   Complete by: As directed    1) please be sure to take azithromycin 500 mg daily for the next 7 days as prescribed for Legionella pneumonia 2) complete abstinence from tobacco/nicotine strongly advised--please use nicotine patch to help you quit smoking 3)you need oxygen at home at 3 L via nasal cannula continuously while awake and while asleep--- smoking or having open fires around oxygen can cause fire, significant injury  and death 4) please follow-up with your primary care physician for recheck and reevaluation in a week or so 5) please complete prednisone 40 mg daily for the next 5 days as prescribed for COPD flareup 6)Avoid ibuprofen/Advil/Aleve/Motrin/Goody Powders/Naproxen/BC powders/Meloxicam/Diclofenac/Indomethacin and other Nonsteroidal anti-inflammatory medications as these will make you more likely to bleed and can cause stomach ulcers, can also cause Kidney problems.   Increase activity slowly   Complete by: As directed       Discharge Medications     Allergies as of 02/12/2020   No Known Allergies     Medication List    STOP taking these medications   celecoxib 200 MG capsule Commonly known as: CeleBREX   HYDROcodone-acetaminophen 5-325 MG tablet Commonly  known as: NORCO/VICODIN   pravastatin 80 MG tablet Commonly known as: PRAVACHOL     TAKE these medications   acetaminophen 325 MG tablet Commonly known as: TYLENOL Take 2 tablets (650 mg total) by mouth every 6 (six) hours as needed for mild pain, fever or headache.   albuterol 108 (90 Base) MCG/ACT inhaler Commonly known as: VENTOLIN HFA Inhale 2 puffs into the lungs every 4 (four) hours as needed for wheezing or shortness of breath. What changed: when to take this   aspirin EC 81 MG tablet Take 1 tablet (81 mg total) by mouth daily with breakfast.   atorvastatin 40 MG tablet Commonly known as: LIPITOR Take 1 tablet (40 mg total) by mouth every evening. What changed: when to take this   azithromycin 500 MG tablet Commonly known as: ZITHROMAX Take 1 tablet (500 mg total) by mouth daily for 7 days. For Legionella pneumonia   Belsomra 15 MG Tabs Generic drug: Suvorexant Take 1 tablet by mouth at bedtime as needed (insomnia). for sleep   budesonide-formoterol 160-4.5 MCG/ACT inhaler Commonly known as: SYMBICORT Inhale 2 puffs into the lungs 2 (two) times daily. What changed: when to take this   carvedilol 3.125 MG tablet Commonly known as: COREG Take 1 tablet (3.125 mg total) by mouth 2 (two) times daily with a meal.   cholecalciferol 1000 units tablet Commonly known as: VITAMIN D Take 1,000 Units by mouth daily.   ESTROVEN PO Take 1 tablet by mouth every evening.   Fish Oil 1000 MG Caps Take 1,000 mg by mouth daily.   guaiFENesin 600 MG 12 hr tablet Commonly known as: MUCINEX Take 1 tablet (600 mg total) by mouth 2 (two) times daily.   levothyroxine 75 MCG tablet Commonly known as: SYNTHROID Take 75 mcg by mouth daily before breakfast.   lisinopril 20 MG tablet Commonly known as: ZESTRIL Take 1 tablet (20 mg total) by mouth daily.   nicotine 14 mg/24hr patch Commonly known as: NICODERM CQ - dosed in mg/24 hours Place 1 patch (14 mg total) onto the skin  daily. Start taking on: February 13, 2020   Potassium Gluconate 595 MG Caps Take 1 capsule by mouth daily.   predniSONE 20 MG tablet Commonly known as: Deltasone Take 2 tablets (40 mg total) by mouth daily with breakfast.   traMADol 50 MG tablet Commonly known as: ULTRAM Take 50 mg by mouth 4 (four) times daily as needed for moderate pain or severe pain.   traZODone 100 MG tablet Commonly known as: DESYREL Take 1 tablet (100 mg total) by mouth at bedtime as needed for sleep.   vitamin E 180 MG (400 UNITS) capsule Take 400 Units by mouth daily.  Durable Medical Equipment  (From admission, onward)         Start     Ordered   02/12/20 1031  For home use only DME oxygen  Once       Comments: Patient Saturations on Room Air at Rest = 85%  Patient Saturations on [employer] while Ambulating = 80%  Patient Saturations on 3 Liters of oxygen at rest  = 92%  Patient Saturations on 3 Liters of oxygen while Ambulating = 92%  Please briefly explain why patient needs home oxygen:  Patient desats to 85% on room at rest.  Patient remains in low 90% range while ambulating on 3 L [place]. Patient does become SOB even with [place] @ 3 L O2.  Diagnosis----COPD  Question Answer Comment  Length of Need Lifetime   Mode or (Route) Nasal cannula   Liters per Minute 3   Oxygen conserving device Yes   Oxygen delivery system Gas      02/12/20 1031          Major procedures and Radiology Reports - PLEASE review detailed and final reports for all details, in brief -    DG Chest 2 View  Result Date: 02/11/2020 CLINICAL DATA:  Chest pain, shortness of breath EXAM: CHEST - 2 VIEW COMPARISON:  02/09/2020 FINDINGS: Stable cardiomegaly. Atherosclerotic calcification of the aortic knob. Worsening right upper lobe pneumonia. Linear scarring or atelectasis within the left lung base. No pleural effusion. No pneumothorax. Chronic compression fracture of T12. IMPRESSION: Worsening right upper  lobe pneumonia. Electronically Signed   By: Davina Poke D.O.   On: 02/11/2020 10:51   DG Chest Port 1 View  Result Date: 02/09/2020 CLINICAL DATA:  Questionable sepsis. EXAM: PORTABLE CHEST 1 VIEW COMPARISON:  10/09/2019 FINDINGS: Chronic cardiomegaly. Airspace disease at the right upper lobe at least. Left mid pulmonary scarring that is mild. No edema, effusion, or pneumothorax. Artifact from EKG leads. IMPRESSION: 1. Pneumonia in the right upper lobe at least. 2. Chronic cardiomegaly. Electronically Signed   By: Monte Fantasia M.D.   On: 02/09/2020 04:57   ECHOCARDIOGRAM COMPLETE  Result Date: 02/10/2020    ECHOCARDIOGRAM REPORT   Patient Name:   MIAISABELLA BACORN Date of Exam: 02/10/2020 Medical Rec #:  [id number]         Height:       64.0 in Accession #:    [id number]        Weight:       143.6 lb Date of Birth:  [birth year]          BSA:          1.699 m Patient Age:    36 years          BP:           124/67 mmHg Patient Gender: F                 HR:           97 bpm. Exam Location:  [facility] Procedure: 2D Echo Indications:    Dyspnea 786.09 / R06.00  History:        Patient has prior history of Echocardiogram examinations, most                 recent 05/25/2015. CHF; Risk Factors:Current Smoker,                 Dyslipidemia and Hypertension. Sepsis due to pneumonia.  Sonographer:    Leavy Cella RDCS (AE)  Referring Phys: [id number] Laquin[place] IMPRESSIONS  1. Left ventricular ejection fraction, by estimation, is 30 to 35%. The left ventricle has moderately decreased function. The left ventricle demonstrates global hypokinesis. There is severe left ventricular hypertrophy. Left ventricular diastolic parameters are indeterminate.  2. Right ventricular systolic function is normal. The right ventricular size is normal.  3. Left atrial size was moderately dilated.  4. The mitral valve is normal in structure. Mild mitral valve regurgitation. No evidence of mitral stenosis.  5. The aortic valve is  tricuspid. Aortic valve regurgitation is not visualized. No aortic stenosis is present.  6. The inferior vena cava is normal in size with greater than 50% respiratory variability, suggesting right atrial pressure of 3 mmHg. FINDINGS  Left Ventricle: Left ventricular ejection fraction, by estimation, is 30 to 35%. The left ventricle has moderately decreased function. The left ventricle demonstrates global hypokinesis. The left ventricular internal cavity size was normal in size. There is severe left ventricular hypertrophy. Left ventricular diastolic parameters are indeterminate. Indeterminate filling pressures. Right Ventricle: The right ventricular size is normal. No increase in right ventricular wall thickness. Right ventricular systolic function is normal. Left Atrium: Left atrial size was moderately dilated. Right Atrium: Right atrial size was normal in size. Pericardium: There is no evidence of pericardial effusion. Mitral Valve: The mitral valve is normal in structure. There is mild thickening of the mitral valve leaflet(s). There is mild calcification of the mitral valve leaflet(s). Mild mitral annular calcification. Mild mitral valve regurgitation. No evidence of  mitral valve stenosis. Tricuspid Valve: The tricuspid valve is normal in structure. Tricuspid valve regurgitation is not demonstrated. No evidence of tricuspid stenosis. Aortic Valve: The aortic valve is tricuspid. . There is mild thickening and mild calcification of the aortic valve. Aortic valve regurgitation is not visualized. No aortic stenosis is present. Mild aortic valve annular calcification. There is mild thickening of the aortic valve. There is mild calcification of the aortic valve. Pulmonic Valve: The pulmonic valve was not well visualized. Pulmonic valve regurgitation is not visualized. No evidence of pulmonic stenosis. Aorta: The aortic root is normal in size and structure. Pulmonary Artery: Indeterminate PASP, inadequate TR jet.  Venous: The inferior vena cava is normal in size with greater than 50% respiratory variability, suggesting right atrial pressure of 3 mmHg. IAS/Shunts: No atrial level shunt detected by color flow Doppler.  LEFT VENTRICLE PLAX 2D LVIDd:         5.59 cm  Diastology LVIDs:         4.69 cm  LV e' lateral:   11.60 cm/s LV PW:         1.51 cm  LV E/e' lateral: 10.9 LV IVS:        1.50 cm  LV e' medial:    7.51 cm/s LVOT diam:     2.10 cm  LV E/e' medial:  16.9 LV SV:         63 LV SV Index:   37 LVOT Area:     3.46 cm  RIGHT VENTRICLE RV S prime:     11.40 cm/s TAPSE (M-mode): 2.0 cm LEFT ATRIUM             Index       RIGHT ATRIUM           Index LA diam:        4.30 cm 2.53 cm/m  RA Area:     14.10 cm LA Vol (A2C):   49.5 ml 29.13 ml/m  RA Volume:   33.70 ml  19.83 ml/m LA Vol (A4C):   58.3 ml 34.31 ml/m LA Biplane Vol: 54.8 ml 32.25 ml/m  AORTIC VALVE LVOT Vmax:   108.30 cm/s LVOT Vmean:  69.654 cm/s LVOT VTI:    0.181 m  AORTA Ao Root diam: 3.80 cm MITRAL VALVE MV Area (PHT): 5.02 cm     SHUNTS MV Decel Time: 151 msec     Systemic VTI:  0.18 m MV E velocity: 127.00 cm/s  Systemic Diam: 2.10 cm Carlyle Dolly MD Electronically signed by Carlyle Dolly MD Signature Date/Time: 02/10/2020/12:08:42 PM    Final     Micro Results   Recent Results (from the past 240 hour(s))  SARS Coronavirus 2 by RT PCR (hospital order, performed in [place] hospital lab) Nasopharyngeal Nasopharyngeal Swab     Status: None   Collection Time: 02/09/20  1:01 AM   Specimen: Nasopharyngeal Swab  Result Value Ref Range Status   SARS Coronavirus 2 NEGATIVE NEGATIVE Final    Comment: (NOTE) SARS-CoV-2 target nucleic acids are NOT DETECTED.  The SARS-CoV-2 RNA is generally detectable in upper and lower respiratory specimens during the acute phase of infection. The lowest concentration of SARS-CoV-2 viral copies this assay can detect is 250 copies / mL. A negative result does not preclude SARS-CoV-2 infection and should  not be used as the sole basis for treatment or other patient management decisions.  A negative result may occur with improper specimen collection / handling, submission of specimen other than nasopharyngeal swab, presence of viral mutation(s) within the areas targeted by this assay, and inadequate number of viral copies (<250 copies / mL). A negative result must be combined with clinical observations, patient history, and epidemiological information.  Fact Sheet for Patients:   StrictlyIdeas.no  Fact Sheet for Healthcare Providers: BankingDealers.co.za  This test is not yet approved or  cleared by the [place] FDA and has been authorized for detection and/or diagnosis of SARS-CoV-2 by FDA under an Emergency Use Authorization (EUA).  This EUA will remain in effect (meaning this test can be used) for the duration of the COVID-19 declaration under Section 564(b)(1) of the Act, 21 U.S.C. section 360bbb-3(b)(1), unless the authorization is terminated or revoked sooner.  Performed at [facility], [place], [address], [place], [place] [address]   Urine culture     Status: None   Collection Time: 02/09/20  3:55 AM   Specimen: In/Out Cath Urine  Result Value Ref Range Status   Specimen Description   Final    IN/OUT CATH URINE Performed at [facility], [address], [place], [place] [address]    Special Requests   Final    NONE Performed at [facility], [address] [place], [place] [address]    Culture   Final    NO GROWTH Performed at [facility], [place] [address], [place], [place] [address]    Report Status 02/10/2020 FINAL  Final  Blood Culture (routine x 2)     Status: None (Preliminary result)   Collection Time: 02/09/20  4:16 AM   Specimen: BLOOD RIGHT HAND  Result Value Ref Range Status   Specimen Description BLOOD RIGHT HAND  Final   Special Requests   Final    BOTTLES DRAWN AEROBIC AND ANAEROBIC Blood Culture  adequate volume   Culture   Final    NO GROWTH 3 DAYS Performed at [facility], [address], [place],  [address]    Report Status PENDING  Incomplete  Blood Culture (routine x 2)     Status: None (Preliminary result)   Collection Time: 02/09/20  4:20 AM   Specimen: BLOOD  Result Value Ref Range Status   Specimen Description BLOOD RIGHT ANTECUBITAL  Final   Special Requests   Final    BOTTLES DRAWN AEROBIC AND ANAEROBIC Blood Culture adequate volume   Culture   Final    NO GROWTH 3 DAYS Performed at [facility], [address], [place], [place] [address]    Report Status PENDING  Incomplete       Today   Subjective    Chace Bisch today has no new complaints, shortness of breath and cough improving, no fevers no vomiting or diarrhea -Continues to require oxygen especially with activity          Patient has been seen and examined prior to discharge   Objective   Blood pressure (!) 142/88, pulse 82, temperature 99.2 F (37.3 C), temperature source Oral, resp. rate 16, height _0  (1.626 m), weight 65.1 kg, SpO2 96 %.   Intake/Output Summary (Last 24 hours) at 02/12/2020 1107 Last data filed at 02/12/2020 0600 Gross per 24 hour  Intake 1803.17 ml  Output --  Net 1803.17 ml    Exam Gen:- Awake Alert, no acute distress, no conversational dyspnea HEENT:- [place].AT, No sclera icterus Nose- [place] 3L/min Neck-Supple Neck,No JVD,.  Lungs-improved air movement, scattered rhonchi improving  CV- S1, S2 normal, regular Abd-  +ve B.Sounds, Abd Soft, No tenderness,    Extremity/Skin:- No  edema,   good pulses Psych-affect is appropriate, oriented x3 Neuro-no new focal deficits, no tremors    Data Review   CBC w Diff:  Lab Results  Component Value Date   WBC 17.9 (H) 02/12/2020   HGB 14.1 02/12/2020   HCT 43.3 02/12/2020   PLT 320 02/12/2020   LYMPHOPCT 2 02/09/2020   MONOPCT 4 02/09/2020   EOSPCT 0 02/09/2020   BASOPCT 0 02/09/2020    CMP:  Lab Results   Component Value Date   NA 133 (L) 02/11/2020   K 4.1 02/11/2020   CL 94 (L) 02/11/2020   CO2 28 02/11/2020   BUN 18 02/11/2020   CREATININE 0.79 02/11/2020   PROT 9.0 (H) 02/09/2020   ALBUMIN 3.9 02/09/2020   BILITOT 0.7 02/09/2020   ALKPHOS 83 02/09/2020   AST 18 02/09/2020   ALT 16 02/09/2020  .  Total Discharge time is about 33 minutes  Roxan Hockey M.D on 02/12/2020 at 11:07 AM  Go to [email or web address] -  for contact info  [facility] - Office  [phone]

## 2020-02-12 NOTE — Progress Notes (Addendum)
SATURATION QUALIFICATIONS: (This note is used to comply with regulatory documentation for home oxygen)  Patient Saturations on Room Air at Rest = 85%  Patient Saturations on Room Air while Ambulating = 80%  Patient Saturations on 3 Liters of oxygen at rest  = 92%  Patient Saturations on 3 Liters of oxygen while Ambulating = 92%  Please briefly explain why patient needs home oxygen:  Patient desats to 85% on room at rest.  Patient remains in low 90% range while ambulating on 3 L [place]. Patient does become SOB even with [place] @ 3 L O2.    Diagnosis --COPD  Roxan Hockey, MD

## 2020-02-12 NOTE — TOC Transition Note (Signed)
Transition of Care [facility]) - CM/SW Discharge Note   Patient Details  Name: Penny Hicks MRN: [id number] Date of Birth: [birth year]  Transition of Care [facility]) CM/SW Contact:  Iona Beard, [place] Phone Number: 02/12/2020, 10:35 AM   Clinical Narrative:    Pt admitted for sepsis due to pneumonia. TOC gave referral for home O2 to [facility] with [employer]. Barbaraann Rondo accepted referral and delivered O2 to pt in room. TOC informed pts husband Remo Lipps of d/c with home O2.    Final next level of care: Home/Self Care (Oxygen) Barriers to Discharge: Barriers Resolved   Patient Goals and CMS Choice Patient states their goals for this hospitalization and ongoing recovery are:: Return home      Discharge Placement                  Name of family member notified: Unnamed Hino Patient and family notified of of transfer: 02/12/20  Discharge Plan and Services                DME Arranged: Oxygen DME Agency: [employer] Date DME Agency Contacted: 02/12/20 Time DME Agency Contacted: [id number] Representative spoke with at DME Agency: [place] ([place]) Interventions     Readmission Risk Interventions No flowsheet data found.

## 2020-02-12 NOTE — Discharge Instructions (Signed)
1) please be sure to take azithromycin 500 mg daily for the next 7 days as prescribed for Legionella pneumonia 2) complete abstinence from tobacco/nicotine strongly advised--please use nicotine patch to help you quit smoking 3)you need oxygen at home at 3 L via nasal cannula continuously while awake and while asleep--- smoking or having open fires around oxygen can cause fire, significant injury and death 4) please follow-up with your primary care physician for recheck and reevaluation in a week or so 5) please complete prednisone 40 mg daily for the next 5 days as prescribed for COPD flareup 6)Avoid ibuprofen/Advil/Aleve/Motrin/Goody Powders/Naproxen/BC powders/Meloxicam/Diclofenac/Indomethacin and other Nonsteroidal anti-inflammatory medications as these will make you more likely to bleed and can cause stomach ulcers, can also cause Kidney problems.

## 2020-02-14 LAB — CULTURE, BLOOD (ROUTINE X 2)
Culture: NO GROWTH
Culture: NO GROWTH
Special Requests: ADEQUATE
Special Requests: ADEQUATE

## 2020-02-17 DIAGNOSIS — A481 Legionnaires' disease: Secondary | ICD-10-CM | POA: Diagnosis present

## 2020-02-19 ENCOUNTER — Other Ambulatory Visit (HOSPITAL_COMMUNITY): Payer: Self-pay | Admitting: Internal Medicine

## 2020-02-19 DIAGNOSIS — E2839 Other primary ovarian failure: Secondary | ICD-10-CM

## 2020-05-27 IMAGING — MG MM DIGITAL DIAGNOSTIC UNILAT*R* W/ TOMO W/ CAD
6 series · 6 of 18 positions shown · non-contrast
Comparison: Previous exam(s).

CLINICAL DATA: Patient was called back from screening mammogram for
a possible asymmetry in the right breast.

EXAM:
DIGITAL DIAGNOSTIC UNILATERAL RIGHT MAMMOGRAM WITH CAD AND TOMO

[R CC synth-2D (1 of 2)]
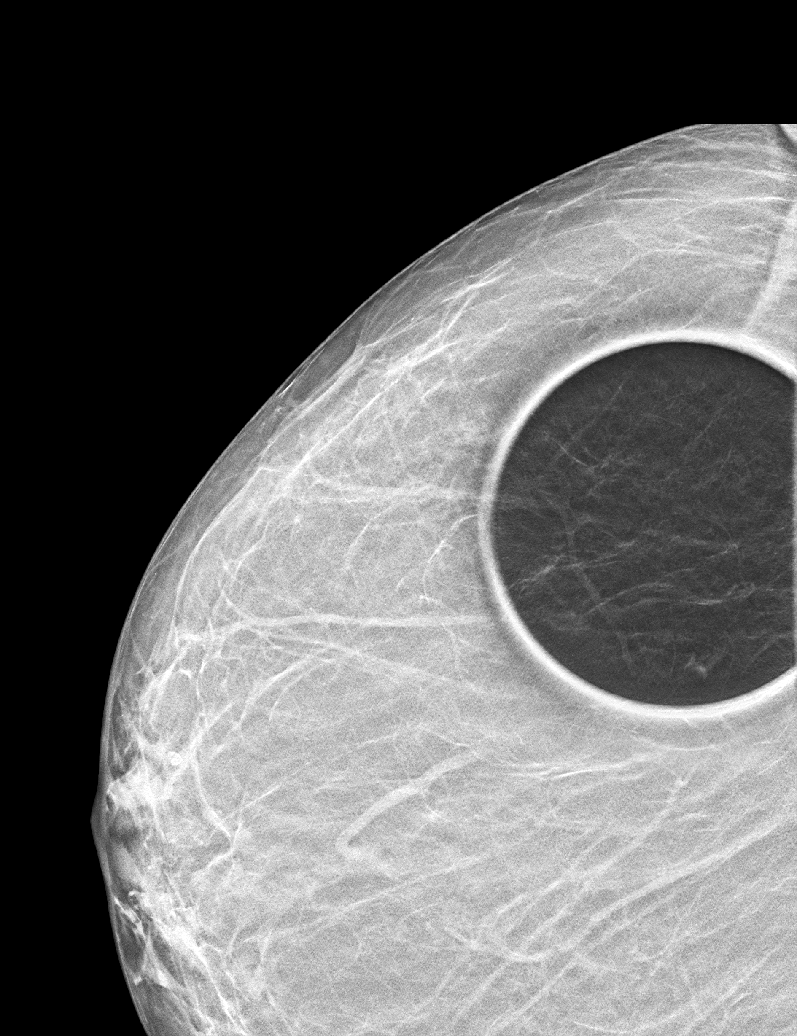

[R ML synth-2D]
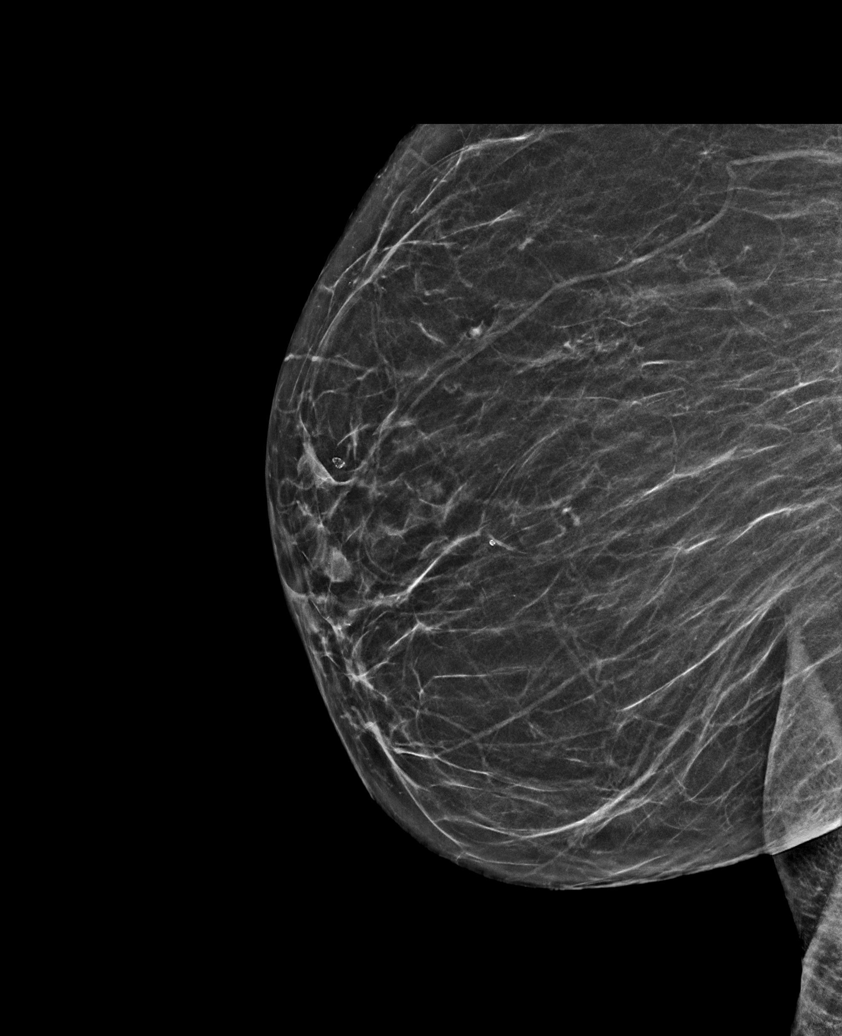

[R CC synth-2D (2 of 2)]
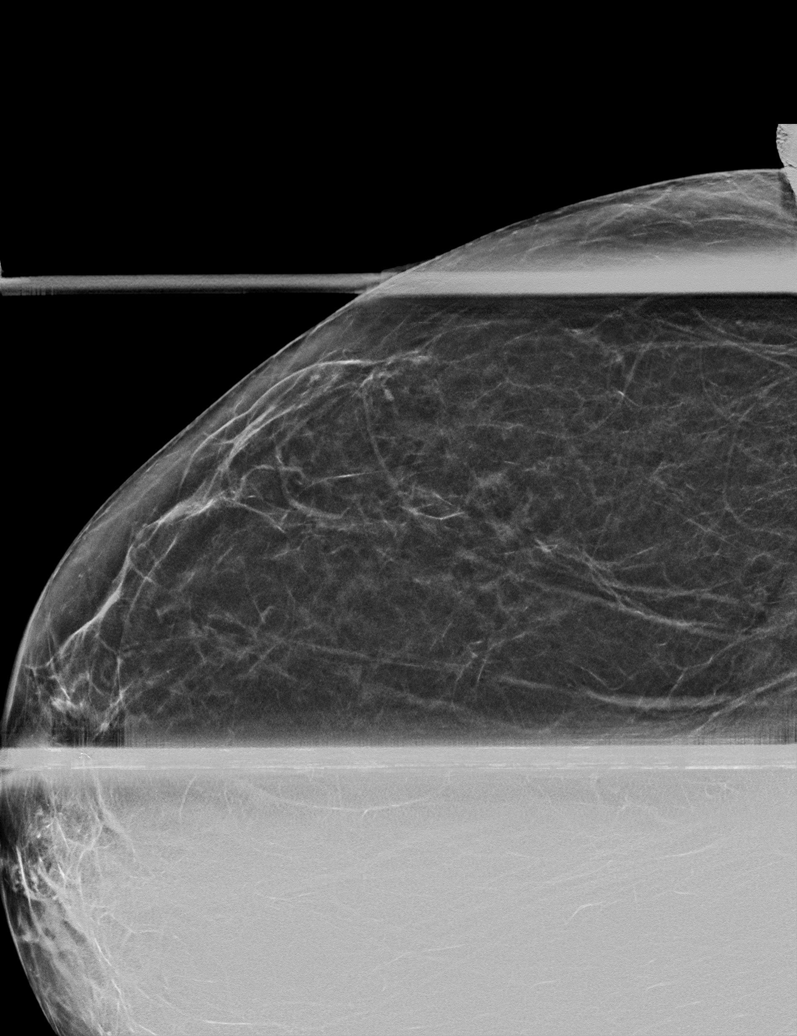

[R ML tomo · tomo slice 25/50.0]
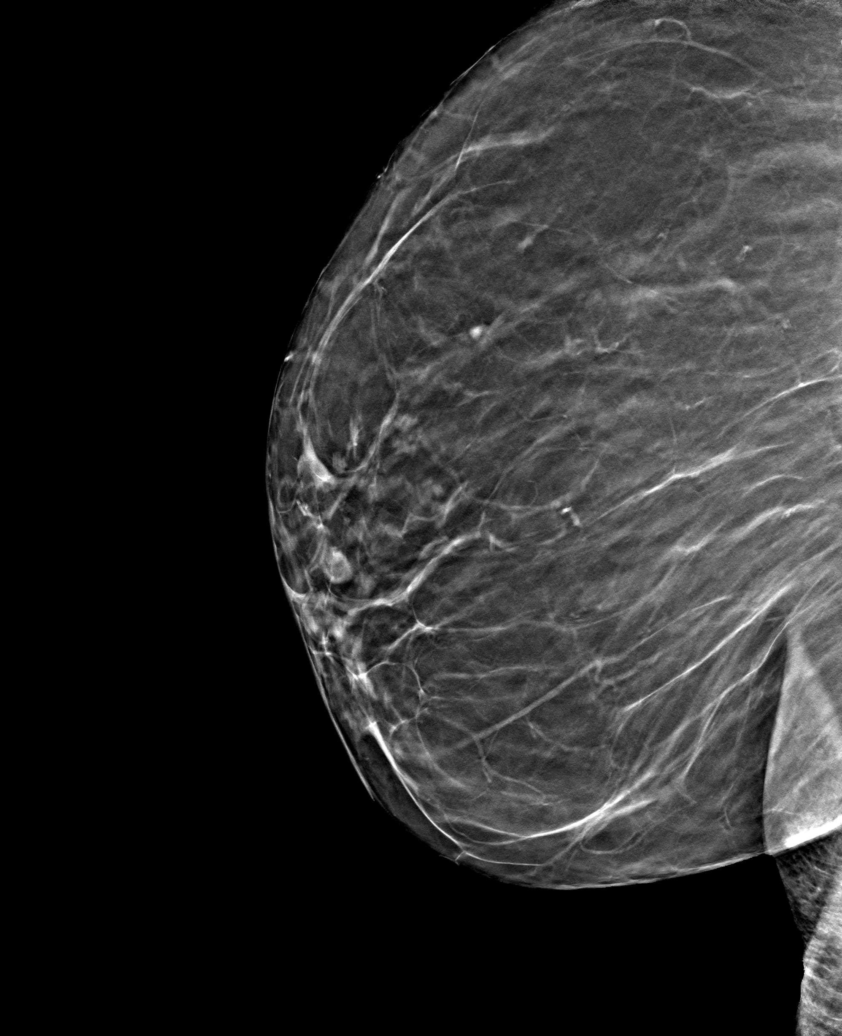

[R CC tomo (1 of 2) · tomo slice 15/30.0]
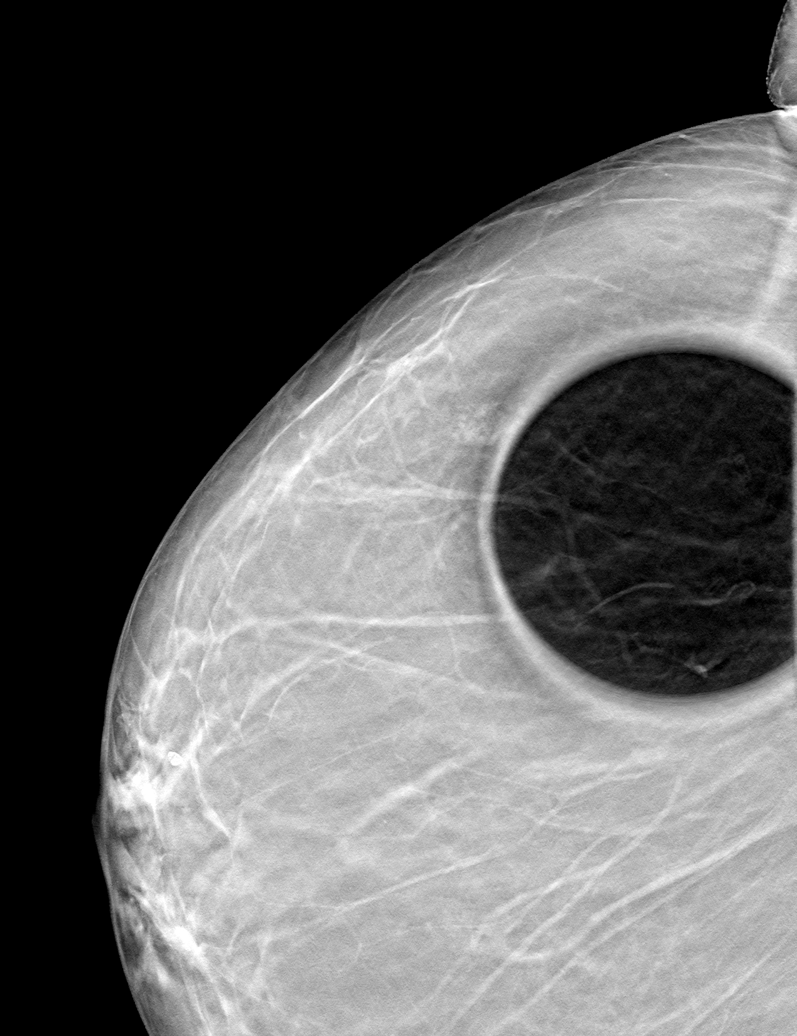

[R CC tomo (2 of 2) · tomo slice 23/44.0]
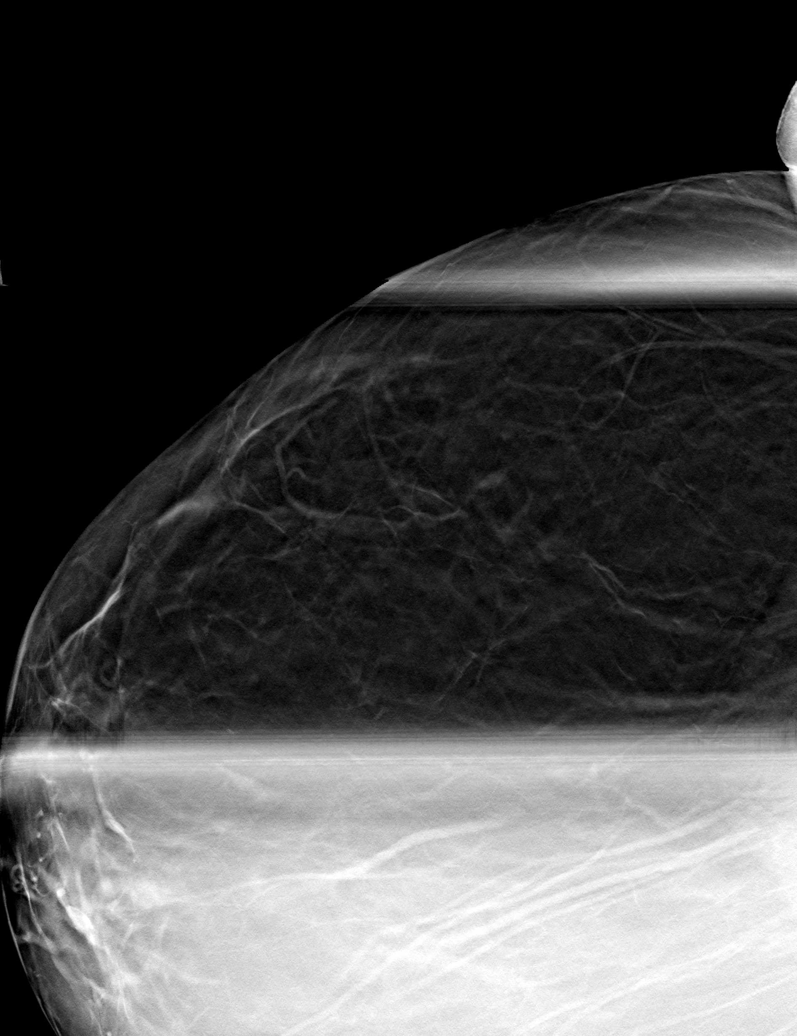

[6 of 18 positions shown; findings below may reference images not displayed]

ACR Breast Density Category b: There are scattered areas of
fibroglandular density.
FINDINGS: Additional imaging of the right breast was performed. Parenchymal
pattern is stable compared to prior exams. No suspicious mass or
malignant type microcalcifications identified.

Mammographic images were processed with CAD.
IMPRESSION: No evidence of malignancy in the right breast.

RECOMMENDATION:
Bilateral screening mammogram in 1 year is recommended.

I have discussed the findings and recommendations with the patient.
If applicable, a reminder letter will be sent to the patient
regarding the next appointment.

BI-RADS CATEGORY  1: Negative.

## 2021-02-14 ENCOUNTER — Encounter: Payer: Self-pay | Admitting: Gastroenterology

## 2021-07-19 ENCOUNTER — Other Ambulatory Visit (HOSPITAL_COMMUNITY): Payer: Self-pay | Admitting: Family Medicine

## 2021-07-19 DIAGNOSIS — S239XXA Sprain of unspecified parts of thorax, initial encounter: Secondary | ICD-10-CM

## 2022-10-05 ENCOUNTER — Inpatient Hospital Stay (HOSPITAL_COMMUNITY)
Admission: EM | Admit: 2022-10-05 | Discharge: 2022-10-14 | DRG: 025 | Disposition: A | Discharge status: Home / Self Care | Payer: 59 | Attending: Neurological Surgery | Admitting: Neurological Surgery

## 2022-10-05 ENCOUNTER — Encounter (HOSPITAL_COMMUNITY): Payer: Self-pay | Admitting: Emergency Medicine

## 2022-10-05 ENCOUNTER — Emergency Department (HOSPITAL_COMMUNITY): Payer: 59

## 2022-10-05 ENCOUNTER — Inpatient Hospital Stay (HOSPITAL_COMMUNITY): Payer: 59

## 2022-10-05 ENCOUNTER — Other Ambulatory Visit: Payer: Self-pay

## 2022-10-05 DIAGNOSIS — Z79890 Hormone replacement therapy (postmenopausal): Secondary | ICD-10-CM | POA: Diagnosis not present

## 2022-10-05 DIAGNOSIS — G935 Compression of brain: Secondary | ICD-10-CM | POA: Diagnosis present

## 2022-10-05 DIAGNOSIS — G9389 Other specified disorders of brain: Secondary | ICD-10-CM | POA: Diagnosis not present

## 2022-10-05 DIAGNOSIS — E039 Hypothyroidism, unspecified: Secondary | ICD-10-CM | POA: Diagnosis present

## 2022-10-05 DIAGNOSIS — D320 Benign neoplasm of cerebral meninges: Secondary | ICD-10-CM | POA: Diagnosis present

## 2022-10-05 DIAGNOSIS — I5042 Chronic combined systolic (congestive) and diastolic (congestive) heart failure: Secondary | ICD-10-CM | POA: Diagnosis present

## 2022-10-05 DIAGNOSIS — F17210 Nicotine dependence, cigarettes, uncomplicated: Secondary | ICD-10-CM | POA: Diagnosis present

## 2022-10-05 DIAGNOSIS — R7401 Elevation of levels of liver transaminase levels: Secondary | ICD-10-CM | POA: Diagnosis present

## 2022-10-05 DIAGNOSIS — Z7951 Long term (current) use of inhaled steroids: Secondary | ICD-10-CM | POA: Diagnosis not present

## 2022-10-05 DIAGNOSIS — Z8249 Family history of ischemic heart disease and other diseases of the circulatory system: Secondary | ICD-10-CM

## 2022-10-05 DIAGNOSIS — I43 Cardiomyopathy in diseases classified elsewhere: Secondary | ICD-10-CM | POA: Diagnosis present

## 2022-10-05 DIAGNOSIS — D649 Anemia, unspecified: Secondary | ICD-10-CM | POA: Diagnosis not present

## 2022-10-05 DIAGNOSIS — R4182 Altered mental status, unspecified: Secondary | ICD-10-CM

## 2022-10-05 DIAGNOSIS — G936 Cerebral edema: Secondary | ICD-10-CM | POA: Diagnosis present

## 2022-10-05 DIAGNOSIS — I429 Cardiomyopathy, unspecified: Secondary | ICD-10-CM | POA: Diagnosis not present

## 2022-10-05 DIAGNOSIS — I5022 Chronic systolic (congestive) heart failure: Secondary | ICD-10-CM | POA: Diagnosis present

## 2022-10-05 DIAGNOSIS — E876 Hypokalemia: Secondary | ICD-10-CM | POA: Diagnosis present

## 2022-10-05 DIAGNOSIS — Z79899 Other long term (current) drug therapy: Secondary | ICD-10-CM

## 2022-10-05 DIAGNOSIS — E7800 Pure hypercholesterolemia, unspecified: Secondary | ICD-10-CM | POA: Diagnosis present

## 2022-10-05 DIAGNOSIS — J439 Emphysema, unspecified: Secondary | ICD-10-CM | POA: Diagnosis present

## 2022-10-05 DIAGNOSIS — Z803 Family history of malignant neoplasm of breast: Secondary | ICD-10-CM

## 2022-10-05 DIAGNOSIS — I509 Heart failure, unspecified: Secondary | ICD-10-CM | POA: Diagnosis not present

## 2022-10-05 DIAGNOSIS — I160 Hypertensive urgency: Secondary | ICD-10-CM | POA: Diagnosis present

## 2022-10-05 DIAGNOSIS — I110 Hypertensive heart disease with heart failure: Secondary | ICD-10-CM | POA: Diagnosis present

## 2022-10-05 DIAGNOSIS — J449 Chronic obstructive pulmonary disease, unspecified: Secondary | ICD-10-CM | POA: Diagnosis not present

## 2022-10-05 DIAGNOSIS — R9431 Abnormal electrocardiogram [ECG] [EKG]: Secondary | ICD-10-CM | POA: Diagnosis present

## 2022-10-05 DIAGNOSIS — R569 Unspecified convulsions: Secondary | ICD-10-CM

## 2022-10-05 DIAGNOSIS — I10 Essential (primary) hypertension: Secondary | ICD-10-CM | POA: Diagnosis not present

## 2022-10-05 DIAGNOSIS — Z01810 Encounter for preprocedural cardiovascular examination: Secondary | ICD-10-CM | POA: Diagnosis not present

## 2022-10-05 LAB — CBC
HCT: 44.3 % (ref 36.0–46.0)
Hemoglobin: 14.6 g/dL (ref 12.0–15.0)
MCH: 30.4 pg (ref 26.0–34.0)
MCHC: 33 g/dL (ref 30.0–36.0)
MCV: 92.3 fL (ref 80.0–100.0)
Platelets: 313 10*3/uL (ref 150–400)
RBC: 4.8 MIL/uL (ref 3.87–5.11)
RDW: 12.4 % (ref 11.5–15.5)
WBC: 8.3 10*3/uL (ref 4.0–10.5)
nRBC: 0 % (ref 0.0–0.2)

## 2022-10-05 LAB — MAGNESIUM: Magnesium: 1.9 mg/dL (ref 1.7–2.4)

## 2022-10-05 LAB — PROTIME-INR
INR: 1 (ref 0.8–1.2)
Prothrombin Time: 13 seconds (ref 11.4–15.2)

## 2022-10-05 LAB — DIFFERENTIAL
Abs Immature Granulocytes: 0.1 10*3/uL — ABNORMAL HIGH (ref 0.00–0.07)
Basophils Absolute: 0 10*3/uL (ref 0.0–0.1)
Basophils Relative: 1 %
Eosinophils Absolute: 0 10*3/uL (ref 0.0–0.5)
Eosinophils Relative: 0 %
Immature Granulocytes: 1 %
Lymphocytes Relative: 17 %
Lymphs Abs: 1.4 10*3/uL (ref 0.7–4.0)
Monocytes Absolute: 0.3 10*3/uL (ref 0.1–1.0)
Monocytes Relative: 4 %
Neutro Abs: 6.4 10*3/uL (ref 1.7–7.7)
Neutrophils Relative %: 77 %

## 2022-10-05 LAB — COMPREHENSIVE METABOLIC PANEL
ALT: 16 U/L (ref 0–44)
AST: 27 U/L (ref 15–41)
Albumin: 3.9 g/dL (ref 3.5–5.0)
Alkaline Phosphatase: 64 U/L (ref 38–126)
Anion gap: 15 (ref 5–15)
BUN: 18 mg/dL (ref 8–23)
CO2: 20 mmol/L — ABNORMAL LOW (ref 22–32)
Calcium: 9.3 mg/dL (ref 8.9–10.3)
Chloride: 101 mmol/L (ref 98–111)
Creatinine, Ser: 1.16 mg/dL — ABNORMAL HIGH (ref 0.44–1.00)
GFR, Estimated: 50 mL/min — ABNORMAL LOW (ref >60)
Glucose, Bld: 159 mg/dL — ABNORMAL HIGH (ref 70–99)
Potassium: 3.4 mmol/L — ABNORMAL LOW (ref 3.5–5.1)
Sodium: 136 mmol/L (ref 135–145)
Total Bilirubin: 0.6 mg/dL (ref 0.3–1.2)
Total Protein: 7.3 g/dL (ref 6.5–8.1)

## 2022-10-05 LAB — I-STAT CHEM 8, ED
BUN: 17 mg/dL (ref 8–23)
Calcium, Ion: 1.15 mmol/L (ref 1.15–1.40)
Chloride: 102 mmol/L (ref 98–111)
Creatinine, Ser: 1.2 mg/dL — ABNORMAL HIGH (ref 0.44–1.00)
Glucose, Bld: 152 mg/dL — ABNORMAL HIGH (ref 70–99)
HCT: 44 % (ref 36.0–46.0)
Hemoglobin: 15 g/dL (ref 12.0–15.0)
Potassium: 3.7 mmol/L (ref 3.5–5.1)
Sodium: 138 mmol/L (ref 135–145)
TCO2: 21 mmol/L — ABNORMAL LOW (ref 22–32)

## 2022-10-05 LAB — ETHANOL: Alcohol, Ethyl (B): <10 mg/dL (ref <10)

## 2022-10-05 LAB — APTT: aPTT: 21 seconds — ABNORMAL LOW (ref 24–36)

## 2022-10-05 MED ORDER — POTASSIUM CHLORIDE 10 MEQ/100ML IV SOLN
10.0000 meq | INTRAVENOUS | Status: AC
  Administered 2022-10-05 (×2): 10 meq via INTRAVENOUS
  Filled 2022-10-05 (×2): qty 100

## 2022-10-05 MED ORDER — SODIUM CHLORIDE 0.9 % IV BOLUS
500.0000 mL | Freq: Once | INTRAVENOUS | Status: AC
  Administered 2022-10-05: 500 mL via INTRAVENOUS

## 2022-10-05 MED ORDER — DEXAMETHASONE SODIUM PHOSPHATE 10 MG/ML IJ SOLN
10.0000 mg | Freq: Four times a day (QID) | INTRAMUSCULAR | Status: DC
  Administered 2022-10-05: 10 mg via INTRAVENOUS
  Filled 2022-10-05: qty 1

## 2022-10-05 MED ORDER — HEPARIN SODIUM (PORCINE) 5000 UNIT/ML IJ SOLN
5000.0000 [IU] | Freq: Three times a day (TID) | INTRAMUSCULAR | Status: AC
  Administered 2022-10-06 – 2022-10-09 (×10): 5000 [IU] via SUBCUTANEOUS
  Filled 2022-10-05 (×10): qty 1

## 2022-10-05 MED ORDER — SODIUM CHLORIDE 0.9 % IV SOLN
100.0000 mL/h | INTRAVENOUS | Status: DC
  Administered 2022-10-05: 100 mL/h via INTRAVENOUS

## 2022-10-05 MED ORDER — SODIUM CHLORIDE 0.9 % IV SOLN
60.0000 mg/kg | Freq: Once | INTRAVENOUS | Status: AC
  Administered 2022-10-05: 3900 mg via INTRAVENOUS
  Filled 2022-10-05: qty 39

## 2022-10-05 MED ORDER — SODIUM CHLORIDE 0.9 % IV SOLN: INTRAVENOUS | Status: DC

## 2022-10-05 MED ORDER — PANTOPRAZOLE SODIUM 40 MG IV SOLR
40.0000 mg | INTRAVENOUS | Status: DC
  Administered 2022-10-05 – 2022-10-10 (×6): 40 mg via INTRAVENOUS
  Filled 2022-10-05 (×6): qty 10

## 2022-10-05 MED ORDER — IOHEXOL 350 MG/ML SOLN
100.0000 mL | Freq: Once | INTRAVENOUS | Status: AC | PRN
  Administered 2022-10-05: 100 mL via INTRAVENOUS

## 2022-10-05 MED ORDER — DEXAMETHASONE SODIUM PHOSPHATE 10 MG/ML IJ SOLN
6.0000 mg | Freq: Four times a day (QID) | INTRAMUSCULAR | Status: DC
  Administered 2022-10-06 – 2022-10-07 (×6): 6 mg via INTRAVENOUS
  Filled 2022-10-05 (×6): qty 1

## 2022-10-05 MED ORDER — ALBUTEROL SULFATE (2.5 MG/3ML) 0.083% IN NEBU: 2.5000 mg | INHALATION_SOLUTION | RESPIRATORY_TRACT | Status: DC | PRN

## 2022-10-05 MED ORDER — LEVETIRACETAM IN NACL 500 MG/100ML IV SOLN
500.0000 mg | Freq: Two times a day (BID) | INTRAVENOUS | Status: DC
  Administered 2022-10-06 – 2022-10-11 (×11): 500 mg via INTRAVENOUS
  Filled 2022-10-05 (×11): qty 100

## 2022-10-05 MED ORDER — LORAZEPAM 2 MG/ML IJ SOLN
2.0000 mg | Freq: Once | INTRAMUSCULAR | Status: AC
  Administered 2022-10-05: 2 mg via INTRAVENOUS
  Filled 2022-10-05: qty 1

## 2022-10-05 NOTE — ED Provider Notes (Signed)
[place] EMERGENCY DEPARTMENT AT [facility] Provider Note   CSN: [id number] Arrival date & time: 10/05/22  1723     History  Chief complaint: Covid stroke  Penny Hicks is a 72 y.o. female.  HPI   Patient has history of hypertension hypercholesterolemia arthritis hypothyroidism COPD.  Patient was brought in by EMS as a code stroke.  Patient reportedly spoke to family members at about 1 PM today on the telephone.  She seemed to be acting normally.  Patient was last seen earlier in the morning.  Patient was driving to pick up her grandkids from school when she appeared to be confused.  EMS was called.  Patient's not able to answer questions properly.  She was noted to have right-sided weakness.  She was activated as a code stroke by EMS.  In the ED patient is only able to answer with okay.  She is not able to provide any other history due to her speech deficit  Home Medications Prior to Admission medications   Medication Sig Start Date End Date Taking? Authorizing Provider  amoxicillin (AMOXIL) 875 MG tablet SMARTSIG:1 Tablet(s) By Mouth Every 12 Hours 05/31/22  Yes [provider]  benzonatate (TESSALON) 200 MG capsule Take 200 mg by mouth 3 (three) times daily. 05/31/22  Yes [provider]  clotrimazole-betamethasone (LOTRISONE) cream SMARTSIG:Topical Morning-Evening 09/18/22  Yes [provider]  cyclobenzaprine (FLEXERIL) 10 MG tablet Take 10 mg by mouth 3 (three) times daily. 04/27/22  Yes [provider]  fluconazole (DIFLUCAN) 150 MG tablet SMARTSIG:1 Tablet(s) By Mouth 4 Times a Week 07/17/22  Yes [provider]  guaiFENesin-codeine 100-10 MG/5ML syrup Take 10 mLs by mouth every 6 (six) hours as needed. 05/31/22  Yes [provider]  traMADol (ULTRAM) 50 MG tablet Take 50 mg by mouth 4 (four) times daily as needed for moderate pain or severe pain.   Yes [provider]  vitamin E 400 UNIT capsule Take 400  Units by mouth daily.   Yes [provider]  acetaminophen (TYLENOL) 325 MG tablet Take 2 tablets (650 mg total) by mouth every 6 (six) hours as needed for mild pain, fever or headache. 02/12/20   Shon Hale, MD  albuterol (VENTOLIN HFA) 108 (90 Base) MCG/ACT inhaler Inhale 2 puffs into the lungs every 4 (four) hours as needed for wheezing or shortness of breath. 02/12/20   Shon Hale, MD  atorvastatin (LIPITOR) 40 MG tablet Take 1 tablet (40 mg total) by mouth every evening. 02/12/20   Emokpae, Courage, MD  BELSOMRA 15 MG TABS Take 1 tablet by mouth at bedtime as needed (insomnia). for sleep 12/06/17   [provider]  budesonide-formoterol (SYMBICORT) 160-4.5 MCG/ACT inhaler Inhale 2 puffs into the lungs 2 (two) times daily. 02/12/20   Shon Hale, MD  carvedilol (COREG) 3.125 MG tablet Take 1 tablet (3.125 mg total) by mouth 2 (two) times daily with a meal. 02/12/20   Emokpae, Courage, MD  cholecalciferol (VITAMIN D) 1000 UNITS tablet Take 1,000 Units by mouth daily.    [provider]  guaiFENesin (MUCINEX) 600 MG 12 hr tablet Take 1 tablet (600 mg total) by mouth 2 (two) times daily. 02/12/20   Shon Hale, MD  levothyroxine (SYNTHROID, LEVOTHROID) 75 MCG tablet Take 75 mcg by mouth daily before breakfast.    [provider]  lisinopril (ZESTRIL) 20 MG tablet Take 1 tablet (20 mg total) by mouth daily. 02/12/20   Shon Hale, MD  nicotine (NICODERM CQ -  DOSED IN MG/24 HOURS) 14 mg/24hr patch Place 1 patch (14 mg total) onto the skin daily. 02/13/20   Shon Hale, MD  Nutritional Supplements (ESTROVEN PO) Take 1 tablet by mouth every evening.    [provider]  Omega-3 Fatty Acids (FISH OIL) 1000 MG CAPS Take 1,000 mg by mouth daily. Patient not taking: Reported on 02/09/2020    [provider]  Potassium Gluconate 595 MG CAPS Take 1 capsule by mouth daily.    [provider]  pravastatin (PRAVACHOL) 80 MG tablet TAKE 1  TABLET BY MOUTH EVERYDAY AT BEDTIME    [provider]      Allergies    Patient has no known allergies.    Review of Systems   Review of Systems  Physical Exam Updated Vital Signs Ht 1.626 m (5\' 4" )   Wt 65 kg   BMI 24.60 kg/m  Physical Exam Vitals and nursing note reviewed.  Constitutional:      General: She is not in acute distress.    Appearance: She is well-developed.  HENT:     Head: Normocephalic and atraumatic.     Right Ear: External ear normal.     Left Ear: External ear normal.  Eyes:     General: No scleral icterus.       Right eye: No discharge.        Left eye: No discharge.     Conjunctiva/sclera: Conjunctivae normal.  Neck:     Trachea: No tracheal deviation.  Cardiovascular:     Rate and Rhythm: Normal rate and regular rhythm.  Pulmonary:     Effort: Pulmonary effort is normal. No respiratory distress.     Breath sounds: Normal breath sounds. No stridor. No wheezing or rales.  Abdominal:     General: Bowel sounds are normal. There is no distension.     Palpations: Abdomen is soft.     Tenderness: There is no abdominal tenderness. There is no guarding or rebound.  Musculoskeletal:        General: No tenderness.     Cervical back: Neck supple.  Skin:    General: Skin is warm and dry.     Findings: No rash.  Neurological:     Mental Status: She is alert.     GCS: GCS eye subscore is 4. GCS verbal subscore is 4. GCS motor subscore is 6.     Cranial Nerves: Cranial nerve deficit present. No dysarthria or facial asymmetry.     Sensory: No sensory deficit.     Motor: Weakness present. No abnormal muscle tone, seizure activity or pronator drift.     Comments: Patient not able to hold her right leg or right arm off the bed for 5 seconds, she is able to move her left arm and left leg.  Unable to determine if she has any visual field defect because of her speech deficit.  Patient not able to name objects, only answering with okay unable to tell us  her age or where she is at currently  Psychiatric:        Mood and Affect: Mood normal.     ED Results / Procedures / Treatments   Labs (all labs ordered are listed, but only abnormal results are displayed) Labs Reviewed  APTT - Abnormal; Notable for the following components:      Result Value   aPTT 21 (*)    All other components within normal limits  DIFFERENTIAL - Abnormal; Notable for the following components:  Abs Immature Granulocytes 0.10 (*)    All other components within normal limits  COMPREHENSIVE METABOLIC PANEL - Abnormal; Notable for the following components:   Potassium 3.4 (*)    CO2 20 (*)    Glucose, Bld 159 (*)    Creatinine, Ser 1.16 (*)    GFR, Estimated 50 (*)    All other components within normal limits  I-STAT CHEM 8, ED - Abnormal; Notable for the following components:   Creatinine, Ser 1.20 (*)    Glucose, Bld 152 (*)    TCO2 21 (*)    All other components within normal limits  ETHANOL  PROTIME-INR  CBC  RAPID URINE DRUG SCREEN, HOSP PERFORMED  URINALYSIS, ROUTINE W REFLEX MICROSCOPIC    EKG EKG Interpretation  Date/Time:  Thursday October 05 2022 18:01:13 EDT Ventricular Rate:  122 PR Interval:  181 QRS Duration: 115 QT Interval:  377 QTC Calculation: 538 R Axis:   -11 Text Interpretation: Sinus tachycardia Multiple ventricular premature complexes Probable left atrial enlargement LVH with IVCD and secondary repol abnrm Prolonged QT interval No significant change since last tracing Confirmed by Linwood Dibbles [phone]) on 10/05/2022 6:09:22 PM  Radiology CT ANGIO HEAD NECK W WO CM W PERF (CODE STROKE)  Result Date: 10/05/2022 CLINICAL DATA:  Stroke suspected EXAM: CT ANGIOGRAPHY HEAD TECHNIQUE: Multidetector CT imaging of the head was performed using the standard protocol during bolus administration of intravenous contrast. Multiplanar CT image reconstructions and MIPs were obtained to evaluate the vascular anatomy. RADIATION DOSE REDUCTION: This  exam was performed according to the departmental dose-optimization program which includes automated exposure control, adjustment of the mA and/or kV according to patient size and/or use of iterative reconstruction technique. CONTRAST:  OMNIPAQUE IOHEXOL 350 MG/ML SOLN COMPARISON:  Same day CT head FINDINGS: CT HEAD Redemonstrated is a dural-based lesion in the left parasagittal parietal lobe with surrounding vasogenic edema. Please see same day noncontrast enhanced CT brain for additional findings. This mass abuts the superior sagittal sinus but does not definitively invade it. CTA HEAD Anterior circulation: No significant stenosis, proximal occlusion, aneurysm, or vascular malformation. Posterior circulation: No significant stenosis, proximal occlusion, aneurysm, or vascular malformation. Venous sinuses: As permitted by contrast timing, patent. Anatomic variants: Fetal PCA on the left. Review of the MIP images confirms the above findings. IMPRESSION: 1. No intracranial large vessel occlusion or significant stenosis. 2. 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 This mass abuts the superior sagittal sinus but does not definitively invade it. Please see same day noncontrast enhanced CT brain for additional findings. Electronically Signed   By: Lorenza Cambridge M.D.   On: 10/05/2022 18:33   CT HEAD CODE STROKE WO CONTRAST  Result Date: 10/05/2022 CLINICAL DATA:  Code stroke.  Right-sided weakness. EXAM: CT HEAD WITHOUT CONTRAST TECHNIQUE: Contiguous axial images were obtained from the base of the skull through the vertex without intravenous contrast. RADIATION DOSE REDUCTION: This exam was performed according to the departmental dose-optimization program which includes automated exposure control, adjustment of the mA and/or kV according to patient size and/or use of iterative reconstruction technique. COMPARISON:  None Available. FINDINGS:  Brain: No hemorrhage. No extra-axial fluid collection. No CT evidence of an acute infarct. There is a 4.2 x 3.5 x 3.9 cm likely dural-based lesion along the parasagittal left parietal lobe. There is vasogenic edema surrounding this lesion, suggestive of parenchymal invasion. This lesion causes mass effect on the left lateral ventricular system. Vascular: No  hyperdense vessel or unexpected calcification. Skull: Normal. Negative for fracture or focal lesion. Sinuses/Orbits: No middle ear or mastoid effusion. Other: None. ASPECTS ([place] Stroke Program Early CT Score: 10 IMPRESSION: 1. 4.2 cm likely dural-based lesion along the parasagittal left parietal lobe with vasogenic edema surrounding this lesion, suggestive of parenchymal invasion. This lesion causes mass effect on the left lateral ventricular system. Recommend MRI brain with and without contrast for further evaluation. 2. No acute intracranial hemorrhage or CT evidence of an acute infarct. Findings were paged to Dr. Selina Cooley via Loretha Stapler on paging system on 10/05/22 at 5:50 PM. Electronically Signed   By: Lorenza Cambridge M.D.   On: 10/05/2022 17:57    Procedures .Critical Care  Performed by: Linwood Dibbles, MD Authorized by: Linwood Dibbles, MD   Critical care provider statement:    Critical care time (minutes):  30   Critical care was time spent personally by me on the following activities:  Development of treatment plan with patient or surrogate, discussions with consultants, evaluation of patient's response to treatment, examination of patient, ordering and review of laboratory studies, ordering and review of radiographic studies, ordering and performing treatments and interventions, pulse oximetry, re-evaluation of patient's condition and review of old charts     Medications Ordered in ED Medications  sodium chloride 0.9 % bolus 500 mL (has no administration in time range)    Followed by  0.9 %  sodium chloride infusion (has no administration in time  range)  levETIRAcetam (KEPPRA) 3,900 mg in sodium chloride 0.9 % 250 mL IVPB (has no administration in time range)    Followed by  levETIRAcetam (KEPPRA) IVPB 500 mg/100 mL premix (has no administration in time range)  LORazepam (ATIVAN) injection 2 mg (has no administration in time range)  dexamethasone (DECADRON) injection 10 mg (has no administration in time range)  pantoprazole (PROTONIX) injection 40 mg (has no administration in time range)  iohexol (OMNIPAQUE) 350 MG/ML injection 100 mL (100 mLs Intravenous Contrast Given 10/05/22 1743)    ED Course/ Medical Decision Making/ A&P Clinical Course as of 10/05/22 1839  Thu Oct 05, 2022  1738 Patient's presentation is concerning for possible acute ischemic stroke.  Cerebral hemorrhage is also obviously concern.  Patient is outside of tPA window but is LVO positive.  Patient was seen on arrival by tele neurology [JK]  1803 Discussed with Dr Selina Cooley. CT shows mass lesion, dural based, could be invading the parenchyma.  Suspect acute seizure. Keppra and ativan ordered.  Transfer and admit to [facility] hospital, continuous EEG.  Plan on MRI when she arrives to determine further treatmemt [JK]  1806 Labs reviewed.  CBC normal.  Metabolic panel unremarkable. [JK]  1839 Case discussed with Dr. Randol Kern regarding admission [JK]    Clinical Course User Index [JK] Linwood Dibbles, MD                             Medical Decision Making Problems Addressed: Altered mental status, unspecified altered mental status type: acute illness or injury that poses a threat to life or bodily functions Brain mass: acute illness or injury Seizure [facility]): acute illness or injury that poses a threat to life or bodily functions  Amount and/or Complexity of Data Reviewed Labs: ordered. Decision-making details documented in ED Course. Radiology: ordered and independent interpretation performed.  Risk Prescription drug management. Decision regarding  hospitalization.   Patient presented to the ED for evaluation of acute altered mental status.  Presentation was concerning for the possible an acute code stroke.  Patient was evaluated by Dr. Selina Cooley, neurology on arrival.  Patient CT scan does not show any evidence of hemorrhage but does show a mass lesion with evidence of vasogenic edema.  It is possible her altered mental status is related to that as well as possible seizures.  IV Keppra has been ordered.  Patient also has been given Ativan.  Plan will be for admission to the hospital at [facility] so that neurology can evaluate the patient directly.  Plan will also be for MRI for further correct resection of this lesion.        Final Clinical Impression(s) / ED Diagnoses Final diagnoses:  Brain mass  Seizure (HCC)  Altered mental status, unspecified altered mental status type    Rx / DC Orders ED Discharge Orders     None         Linwood Dibbles, MD 10/05/22 1839

## 2022-10-05 NOTE — H&P (Signed)
TRH H&P   Patient Demographics:    Penny Hicks, is a 72 y.o. female  MRN: [id number]   DOB - 1951-04-18  Admit Date - 10/05/2022  Outpatient Primary MD for the patient is Assunta Found, MD  Referring MD/NP/PA: Dr Lynelle Doctor    Patient coming from: home  Chief Complaint  Patient presents with   Code Stroke      HPI:    Penny Hicks  is a 72 y.o. female, history of hypertension hypercholesterolemia arthritis hypothyroidism COPD, patient was brought to ED by EMS as a code stroke, patient lives with her husband, daughter at bedside assists with the history, patient reportedly spoke to family around 1 PM today, on the phone, she seemed normal, patient was driving to pick up her grandkids from school, when she was noted to be confused, and EMS were called, she was noted to have right-sided weakness, so code stroke activated and EMS -In ED her CT head was significant for left parietal lobe dural based lesion, with surrounding vasogenic edema, suggesting parenchymal invasion, and causing mass effect on left lateral ventricular system, teleneurology were consulted, concern for seizures secondary to large intracranial mass, she was started empirically on Keppra, and TRH consulted to admit to [facility] for further evaluation.    Review of systems:    Unable to obtain review of system due to encephalopathy  With Past History of the following :    Past Medical History:  Diagnosis Date   Arthritis    COPD (chronic obstructive pulmonary disease) (HCC)    Hypercholesterolemia    Hypertension    Hypothyroidism       Past Surgical History:  Procedure Laterality Date   CESAREAN SECTION  x 2   HIP PINNING,CANNULATED Right 05/25/2015   Procedure: CANNULATED HIP PINNING;  Surgeon: Jodi Geralds, MD;  Location: [facility] OR;  Service: Orthopedics;  Laterality: Right;   TONSILLECTOMY         Social History:     Social History   Tobacco Use   Smoking status: Every Day    Packs/day: 1    Types: Cigarettes   Smokeless tobacco: Never  Substance Use Topics   Alcohol use: No      Family History :     Family History  Problem Relation Age of Onset   Heart disease Father    Heart disease Sister    Heart disease Brother    Breast cancer Paternal Aunt    Heart disease Son       Home Medications:   Prior to Admission medications   Medication Sig Start Date End Date Taking? Authorizing Provider  albuterol (VENTOLIN HFA) 108 (90 Base) MCG/ACT inhaler Inhale 2 puffs into the lungs every 4 (four) hours as needed for wheezing or shortness of breath. 02/12/20  Yes Emokpae, Courage, MD  cholecalciferol (VITAMIN D) 1000 UNITS tablet Take 1,000 Units by mouth  daily.   Yes [provider]  clotrimazole-betamethasone (LOTRISONE) cream Apply 1 Application topically 2 (two) times daily. 09/18/22  Yes [provider]  Doxylamine Succinate, Sleep, (SLEEP-AID PO) Take 2 tablets by mouth at bedtime.   Yes [provider]  lisinopril (ZESTRIL) 20 MG tablet Take 1 tablet (20 mg total) by mouth daily. 02/12/20  Yes Emokpae, Courage, MD  Melatonin 10 MG CAPS Take 1 capsule by mouth at bedtime.   Yes [provider]  Nutritional Supplements (ESTROVEN PO) Take 1 tablet by mouth every evening.   Yes [provider]  Potassium Gluconate 595 MG CAPS Take 1 capsule by mouth daily.   Yes [provider]  traMADol (ULTRAM) 50 MG tablet Take 50 mg by mouth 4 (four) times daily as needed for moderate pain or severe pain.   Yes [provider]  vitamin E 400 UNIT capsule Take 400 Units by mouth daily.   Yes [provider]     Allergies:    No Known Allergies   Physical Exam:   Vitals  Blood pressure (!) 154/87, pulse (!) 118, resp. rate (!) 27, height  (1.626 m), weight 65 kg, SpO2 95 %.   1. General elderly female, laying  in bed, in no apparent distress  2.  Confused, alert, unable to answer any questions appropriately, keep repeating "Olegario Messier"  3.  Progressed to intact, severe aphasia, right facial droop 4. Ears and Eyes appear Normal, Conjunctivae clear, PERRLA. Moist Oral Mucosa.  5. Supple Neck, No JVD, No cervical lymphadenopathy appriciated, No Carotid Bruits.  6. Symmetrical Chest wall movement, Good air movement bilaterally, CTAB.  7. RRR, No Gallops, Rubs or Murmurs, No Parasternal Heave.  8. Positive Bowel Sounds, Abdomen Soft, No tenderness, No organomegaly appriciated,No rebound -guarding or rigidity.  9.  No Cyanosis, Normal Skin Turgor, No Skin Rash or Bruise.  10. Good muscle tone,  joints appear normal , no effusions, Normal ROM.     Data Review:    CBC Recent Labs  Lab 10/05/22 1732  WBC 8.3  HGB 14.6  15.0  HCT 44.3  44.0  PLT 313  MCV 92.3  MCH 30.4  MCHC 33.0  RDW 12.4  LYMPHSABS 1.4  MONOABS 0.3  EOSABS 0.0  BASOSABS 0.0   ------------------------------------------------------------------------------------------------------------------  Chemistries  Recent Labs  Lab 10/05/22 1732  NA 136  138  K 3.4*  3.7  CL 101  102  CO2 20*  GLUCOSE 159*  152*  BUN 18  17  CREATININE 1.16*  1.20*  CALCIUM 9.3  AST 27  ALT 16  ALKPHOS 64  BILITOT 0.6   ------------------------------------------------------------------------------------------------------------------ estimated creatinine clearance is 36.1 mL/min (A) (by C-G formula based on SCr of 1.2 mg/dL (H)). ------------------------------------------------------------------------------------------------------------------ No results for input(s): "TSH", "T4TOTAL", "T3FREE", "THYROIDAB" in the last 72 hours.  Invalid input(s): "FREET3"  Coagulation profile Recent Labs  Lab 10/05/22 1732  INR 1.0    ------------------------------------------------------------------------------------------------------------------- No results for input(s): "DDIMER" in the last 72 hours. -------------------------------------------------------------------------------------------------------------------  Cardiac Enzymes No results for input(s): "CKMB", "TROPONINI", "MYOGLOBIN" in the last 168 hours.  Invalid input(s): "CK" ------------------------------------------------------------------------------------------------------------------    Component Value Date/Time   BNP 195.9 (H) 05/27/2015 1640     ---------------------------------------------------------------------------------------------------------------  Urinalysis    Component Value Date/Time   COLORURINE YELLOW 02/09/2020 0355   APPEARANCEUR HAZY (A) 02/09/2020 0355   LABSPEC 1.024 02/09/2020 0355   PHURINE 5.0 02/09/2020 0355   GLUCOSEU 150 (A) 02/09/2020 0355   HGBUR LARGE (A)  02/09/2020 0355   BILIRUBINUR NEGATIVE 02/09/2020 0355   KETONESUR 5 (A) 02/09/2020 0355   PROTEINUR 100 (A) 02/09/2020 0355   NITRITE NEGATIVE 02/09/2020 0355   LEUKOCYTESUR TRACE (A) 02/09/2020 0355    ----------------------------------------------------------------------------------------------------------------   Imaging Results:    CT ANGIO HEAD NECK W WO CM W PERF (CODE STROKE)  Result Date: 10/05/2022 CLINICAL DATA:  Stroke suspected EXAM: CT ANGIOGRAPHY HEAD TECHNIQUE: Multidetector CT imaging of the head was performed using the standard protocol during bolus administration of intravenous contrast. Multiplanar CT image reconstructions and MIPs were obtained to evaluate the vascular anatomy. RADIATION DOSE REDUCTION: This exam was performed according to the departmental dose-optimization program which includes automated exposure control, adjustment of the mA and/or kV according to patient size and/or use of iterative reconstruction technique.  CONTRAST:  OMNIPAQUE IOHEXOL 350 MG/ML SOLN COMPARISON:  Same day CT head FINDINGS: CT HEAD Redemonstrated is a dural-based lesion in the left parasagittal parietal lobe with surrounding vasogenic edema. Please see same day noncontrast enhanced CT brain for additional findings. This mass abuts the superior sagittal sinus but does not definitively invade it. CTA HEAD Anterior circulation: No significant stenosis, proximal occlusion, aneurysm, or vascular malformation. Posterior circulation: No significant stenosis, proximal occlusion, aneurysm, or vascular malformation. Venous sinuses: As permitted by contrast timing, patent. Anatomic variants: Fetal PCA on the left. Review of the MIP images confirms the above findings. IMPRESSION: 1. No intracranial large vessel occlusion or significant stenosis. 2. 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 This mass abuts the superior sagittal sinus but does not definitively invade it. Please see same day noncontrast enhanced CT brain for additional findings. Electronically Signed   By: Lorenza Cambridge M.D.   On: 10/05/2022 18:33   CT HEAD CODE STROKE WO CONTRAST  Result Date: 10/05/2022 CLINICAL DATA:  Code stroke.  Right-sided weakness. EXAM: CT HEAD WITHOUT CONTRAST TECHNIQUE: Contiguous axial images were obtained from the base of the skull through the vertex without intravenous contrast. RADIATION DOSE REDUCTION: This exam was performed according to the departmental dose-optimization program which includes automated exposure control, adjustment of the mA and/or kV according to patient size and/or use of iterative reconstruction technique. COMPARISON:  None Available. FINDINGS: Brain: No hemorrhage. No extra-axial fluid collection. No CT evidence of an acute infarct. There is a 4.2 x 3.5 x 3.9 cm likely dural-based lesion along the parasagittal left parietal lobe. There is vasogenic edema surrounding  this lesion, suggestive of parenchymal invasion. This lesion causes mass effect on the left lateral ventricular system. Vascular: No hyperdense vessel or unexpected calcification. Skull: Normal. Negative for fracture or focal lesion. Sinuses/Orbits: No middle ear or mastoid effusion. Other: None. ASPECTS ([place] Stroke Program Early CT Score: 10 IMPRESSION: 1. 4.2 cm likely dural-based lesion along the parasagittal left parietal lobe with vasogenic edema surrounding this lesion, suggestive of parenchymal invasion. This lesion causes mass effect on the left lateral ventricular system. Recommend MRI brain with and without contrast for further evaluation. 2. No acute intracranial hemorrhage or CT evidence of an acute infarct. Findings were paged to Dr. Selina Cooley via Loretha Stapler on paging system on 10/05/22 at 5:50 PM. Electronically Signed   By: Lorenza Cambridge M.D.   On: 10/05/2022 17:57    EKG:  PR interval 181 ms QRS duration 115 ms QT/QTcB 377/538 ms P-R-T axes 28 -11 119 Sinus tachycardia Multiple ventricular premature complexes Probable left atrial enlargement LVH with IVCD and secondary repol abnrm Prolonged QT  interval No significant change since last tracing Confirmed by Linwood Dibbles [id number]) on 10/05/2022  Assessment & Plan:    Principal Problem:   Seizure [facility]) Active Problems:   Essential hypertension   Chronic HFrEF (heart failure with reduced ejection fraction)    Acute encephalopathy, most likely due to seizures Intracranial mass -Patient presents with significant confusion, altered mental status, findings are concerning for seizures. -CT head findings concerning for new intracranial mass -With greatly appreciated, patient will be admitted to [facility] for continuous EEG. -MRI brain with without contrast has been ordered. -Patient was started on Keppra per neurology recommendation. -Evidence of mass effect and surrounding edema will start on IV Decadron -Remains significantly  altered, will keep n.p.o. even her meds -Continue with seizure precautions  Hypertension -Given she is n.p.o. we will keep on as needed hydralazine  Chronic systolic heart failure -Most recent echo in 2021 significant for low EF 30 to 35%, currently she appears to be a euvolemic, will monitor closely as she is on IV fluids.  Hypokalemia -Mild at 3.4, will replete  Prolonged QTc -Avoid prolonging agents, replete potassium, will check magnesium   DVT Prophylaxis Heparin -   AM Labs Ordered, also please review Full Orders  Family Communication: Admission, patients condition and plan of care including tests being ordered have been discussed with the patient and daughter at bedside who indicate understanding and agree with the plan and Code Status.  Code Status Full  Likely DC to  home  Condition GUARDED    Consults called: neurology, please notify neurology when patient gets to [facility]  Admission status: inpatient    Time spent in minutes : 70 minutes   Huey Bienenstock M.D on 10/05/2022 at 7:00 PM   [facility] - Office  [phone]

## 2022-10-05 NOTE — ED Notes (Signed)
1719 ems pre alert 1719 page sent to neuro 1722 Dr Selina Cooley joins cart 1723 Pt arrives via ems 1734 Pt to CT.

## 2022-10-05 NOTE — Consult Note (Signed)
NEUROLOGY TELECONSULTATION NOTE   Date of service: October 05, 2022 Patient Name: Penny Hicks MRN:  [id number] DOB:  02-01-1951 Reason for consult: telestroke  Requesting Provider: Dr. Linwood Dibbles Consult Participants: myself, patient, bedside RN, telestroke RN Location of the provider: [facility], [place] Location of the patient: [place]  This consult was provided via telemedicine with 2-way video and audio communication. The patient/family was informed that care would be provided in this way and agreed to receive care in this manner.   _ _ _   _ __   _ __ _ _  __ __   _ __   __ _  History of Present Illness   This is a 72 yo woman with hx COPD, HTN, HL, tobacco abuse who presents with aphasia and R sided weakness. Onset of sx was unwitnessed, LKW 1230 when she was speaking normally to family on phone. Patient is only able to reply yes to questions and has no movement against gravity on her R side. NIHSS = 13. Head CT personal review shows 4.2cm likely dural-based lesion along parasagittal L parietal lobe with surrounding vasogenic edema suggesting parenchymal invasion and causing mass effect on the L lateral ventricular system. I also discussed imaging with radiologist by phone. TNK was not administered 2/2 outside the window and contraindication of brain tumor. Her sx were felt to be 2/2 seizure 2/2 large intracranial mass therefore further evaluation for possible LVO was not felt to be warranted.   ROS   UTA 2/2 aphasia  Past History   The following was personally reviewed:  Past Medical History:  Diagnosis Date   Arthritis    COPD (chronic obstructive pulmonary disease) (HCC)    Hypercholesterolemia    Hypertension    Hypothyroidism    Past Surgical History:  Procedure Laterality Date   CESAREAN SECTION  x 2   HIP PINNING,CANNULATED Right 05/25/2015   Procedure: CANNULATED HIP PINNING;  Surgeon: Jodi Geralds, MD;  Location: [facility] OR;  Service: Orthopedics;  Laterality: Right;    TONSILLECTOMY     Family History  Problem Relation Age of Onset   Heart disease Father    Heart disease Sister    Heart disease Brother    Breast cancer Paternal Aunt    Heart disease Son    Social History   Socioeconomic History   Marital status: Married    Spouse name: Not on file   Number of children: 3   Years of education: Not on file   Highest education level: Not on file  Occupational History   Occupation: retired  Tobacco Use   Smoking status: Every Day    Packs/day: 1    Types: Cigarettes   Smokeless tobacco: Never  Vaping Use   Vaping Use: Not on file  Substance and Sexual Activity   Alcohol use: No   Drug use: No   Sexual activity: Not on file  Other Topics Concern   Not on file  Social History Narrative   Not on file   Social Determinants of Health   Financial Resource Strain: Not on file  Food Insecurity: Not on file  Transportation Needs: Not on file  Physical Activity: Not on file  Stress: Not on file  Social Connections: Not on file   No Known Allergies  Medications   (Not in a hospital admission)     Current Facility-Administered Medications:    sodium chloride 0.9 % bolus 500 mL, 500 mL, Intravenous, Once **FOLLOWED BY** 0.9 %  sodium  chloride infusion, 100 mL/hr, Intravenous, Continuous, Linwood Dibbles, MD   levETIRAcetam (KEPPRA) 60 mg/kg in sodium chloride 0.9 % 100 mL IVPB, 60 mg/kg, Intravenous, Once **FOLLOWED BY** [START ON 10/06/2022] levETIRAcetam (KEPPRA) IVPB 500 mg/100 mL premix, 500 mg, Intravenous, Q12H, Jefferson Fuel, MD   LORazepam (ATIVAN) injection 2 mg, 2 mg, Intravenous, Once, Jefferson Fuel, MD  Current Outpatient Medications:    acetaminophen (TYLENOL) 325 MG tablet, Take 2 tablets (650 mg total) by mouth every 6 (six) hours as needed for mild pain, fever or headache., Disp: 12 tablet, Rfl: 2   albuterol (VENTOLIN HFA) 108 (90 Base) MCG/ACT inhaler, Inhale 2 puffs into the lungs every 4 (four) hours as needed for  wheezing or shortness of breath., Disp: 18 g, Rfl: 3   atorvastatin (LIPITOR) 40 MG tablet, Take 1 tablet (40 mg total) by mouth every evening., Disp: 30 tablet, Rfl: 5   BELSOMRA 15 MG TABS, Take 1 tablet by mouth at bedtime as needed (insomnia). for sleep, Disp: , Rfl: 1   budesonide-formoterol (SYMBICORT) 160-4.5 MCG/ACT inhaler, Inhale 2 puffs into the lungs 2 (two) times daily., Disp: 10.2 g, Rfl: 12   carvedilol (COREG) 3.125 MG tablet, Take 1 tablet (3.125 mg total) by mouth 2 (two) times daily with a meal., Disp: 60 tablet, Rfl: 5   cholecalciferol (VITAMIN D) 1000 UNITS tablet, Take 1,000 Units by mouth daily., Disp: , Rfl:    guaiFENesin (MUCINEX) 600 MG 12 hr tablet, Take 1 tablet (600 mg total) by mouth 2 (two) times daily., Disp: 20 tablet, Rfl: 0   levothyroxine (SYNTHROID, LEVOTHROID) 75 MCG tablet, Take 75 mcg by mouth daily before breakfast., Disp: , Rfl:    lisinopril (ZESTRIL) 20 MG tablet, Take 1 tablet (20 mg total) by mouth daily., Disp: 30 tablet, Rfl: 5   nicotine (NICODERM CQ - DOSED IN MG/24 HOURS) 14 mg/24hr patch, Place 1 patch (14 mg total) onto the skin daily., Disp: 28 patch, Rfl: 0   Nutritional Supplements (ESTROVEN PO), Take 1 tablet by mouth every evening., Disp: , Rfl:    Omega-3 Fatty Acids (FISH OIL) 1000 MG CAPS, Take 1,000 mg by mouth daily. (Patient not taking: Reported on 02/09/2020), Disp: , Rfl:    Potassium Gluconate 595 MG CAPS, Take 1 capsule by mouth daily., Disp: , Rfl:    predniSONE (DELTASONE) 20 MG tablet, Take 2 tablets (40 mg total) by mouth daily with breakfast., Disp: 10 tablet, Rfl: 0   traMADol (ULTRAM) 50 MG tablet, Take 50 mg by mouth 4 (four) times daily as needed for moderate pain or severe pain., Disp: , Rfl:    traZODone (DESYREL) 100 MG tablet, Take 1 tablet (100 mg total) by mouth at bedtime as needed for sleep., Disp: 30 tablet, Rfl: 1   vitamin E 400 UNIT capsule, Take 400 Units by mouth daily., Disp: , Rfl:   Vitals   There were  no vitals filed for this visit.   There is no height or weight on file to calculate BMI.  Physical Exam   Exam performed over telemedicine with 2-way video and audio communication and with assistance of bedside RN  Physical Exam Gen: alert, unable to answer orientation questions or follow commands, keeps just repeating "yes," occasionally can mimic Resp: normal WOB CV: extremities appear well-perfused  Neuro: *ZO:XWRUE, unable to answer orientation questions or follow commands, keeps just repeating "yes," occasionally can mimic *Speech: no dysarthria, severe global aphasia *CN: PERRL 3mm, EOMI, blinks to threat bilat, sensation  intact, R facial droop, hearing intact to voice *Motor:   Normal bulk.  No tremor, rigidity or bradykinesia. No drift LUE and LLE. No movement against gravity RUE and RLE *Sensory: SILT. Symmetric. No double-simultaneous extinction.  *Coordination:  UTA *Reflexes:  UTA 2/2 tele-exam *Gait: deferred  NIHSS  1a Level of Conscious.: 0 1b LOC Questions: 2 1c LOC Commands: 2 2 Best Gaze: 0 3 Visual: 0 4 Facial Palsy: 1 5a Motor Arm - left: 0 5b Motor Arm - Right: 3 6a Motor Leg - Left: 0 6b Motor Leg - Right: 3 7 Limb Ataxia: 0 8 Sensory: 0 9 Best Language: 2 10 Dysarthria: 0 11 Extinct. and Inatten.: 0  TOTAL: 13   Premorbid mRS = 0   Labs   CBC:  Recent Labs  Lab 10/05/22 1732  WBC 8.3  NEUTROABS 6.4  HGB 14.6  15.0  HCT 44.3  44.0  MCV 92.3  PLT 313    Basic Metabolic Panel:  Lab Results  Component Value Date   NA 138 10/05/2022   K 3.7 10/05/2022   CO2 28 02/11/2020   GLUCOSE 152 (H) 10/05/2022   BUN 17 10/05/2022   CREATININE 1.20 (H) 10/05/2022   CALCIUM 8.8 (L) 02/11/2020   GFRNONAA >60 02/11/2020   GFRAA >60 02/11/2020   Lipid Panel: No results found for: "LDLCALC" HgbA1c:  Lab Results  Component Value Date   HGBA1C 6.0 (H) 02/09/2020   Urine Drug Screen: No results found for: "LABOPIA", "COCAINSCRNUR",  "LABBENZ", "AMPHETMU", "THCU", "LABBARB"  Alcohol Level No results found for: "ETH"  CT head 1. 4.2 cm likely dural-based lesion along the parasagittal left parietal lobe with vasogenic edema surrounding this lesion, suggestive of parenchymal invasion. This lesion causes mass effect on the left lateral ventricular system. Recommend MRI brain with and without contrast for further evaluation. 2. No acute intracranial hemorrhage or CT evidence of an acute infarct.  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 This lesion has not been previously diagnosed.  Recommendations   - Keppra 60mg /kg f/b 500mg  bid - Transfer to [facility] hospitalist service for further mgmt and cEEG - Page neurology on arrival to [facility] - cEEG at [facility] with MR compatible leads - MRI brain wwo further characterize intracranial mass - Ativan 2mg  IV prn for seizure activity - D/c tramadol - NPO - Please page neurology when patient arrives to [facility] ______________________________________________________________________   Thank you for the opportunity to take part in the care of this patient. If you have any further questions, please contact the neurology consultation attending.  Signed,  Bing Neighbors, MD [facility] [phone]  If 7pm- 7am, please page neurology on call as listed in AMION.  **Any copied and pasted documentation in this note was written by me in another application not billed for and pasted by me into this document.

## 2022-10-05 NOTE — ED Notes (Signed)
1750 Off cart with Dr Selina Cooley to complete call

## 2022-10-05 NOTE — ED Triage Notes (Signed)
Pt last known normal 1230 today while talking on the phone with family.

## 2022-10-06 ENCOUNTER — Inpatient Hospital Stay (HOSPITAL_COMMUNITY): Payer: 59

## 2022-10-06 DIAGNOSIS — R569 Unspecified convulsions: Secondary | ICD-10-CM | POA: Diagnosis not present

## 2022-10-06 LAB — URINALYSIS, ROUTINE W REFLEX MICROSCOPIC
Bacteria, UA: NONE SEEN
Bilirubin Urine: NEGATIVE
Glucose, UA: NEGATIVE mg/dL
Ketones, ur: 5 mg/dL — AB
Nitrite: NEGATIVE
Protein, ur: NEGATIVE mg/dL
Specific Gravity, Urine: 1.041 — ABNORMAL HIGH (ref 1.005–1.030)
pH: 5 (ref 5.0–8.0)

## 2022-10-06 LAB — CBC
HCT: 43.8 % (ref 36.0–46.0)
Hemoglobin: 14.3 g/dL (ref 12.0–15.0)
MCH: 30 pg (ref 26.0–34.0)
MCHC: 32.6 g/dL (ref 30.0–36.0)
MCV: 92 fL (ref 80.0–100.0)
Platelets: 317 10*3/uL (ref 150–400)
RBC: 4.76 MIL/uL (ref 3.87–5.11)
RDW: 12.4 % (ref 11.5–15.5)
WBC: 9.1 10*3/uL (ref 4.0–10.5)
nRBC: 0 % (ref 0.0–0.2)

## 2022-10-06 LAB — BASIC METABOLIC PANEL
Anion gap: 10 (ref 5–15)
BUN: 10 mg/dL (ref 8–23)
CO2: 24 mmol/L (ref 22–32)
Calcium: 8.7 mg/dL — ABNORMAL LOW (ref 8.9–10.3)
Chloride: 105 mmol/L (ref 98–111)
Creatinine, Ser: 0.94 mg/dL (ref 0.44–1.00)
GFR, Estimated: >60 mL/min (ref >60)
Glucose, Bld: 154 mg/dL — ABNORMAL HIGH (ref 70–99)
Potassium: 3.6 mmol/L (ref 3.5–5.1)
Sodium: 139 mmol/L (ref 135–145)

## 2022-10-06 LAB — RAPID URINE DRUG SCREEN, HOSP PERFORMED
Amphetamines: NOT DETECTED
Barbiturates: NOT DETECTED
Benzodiazepines: NOT DETECTED
Cocaine: NOT DETECTED
Opiates: NOT DETECTED
Tetrahydrocannabinol: NOT DETECTED

## 2022-10-06 MED ORDER — LORAZEPAM 2 MG/ML IJ SOLN: 2.0000 mg | Freq: Once | INTRAMUSCULAR | Status: DC | PRN

## 2022-10-06 MED ORDER — ALBUTEROL SULFATE HFA 108 (90 BASE) MCG/ACT IN AERS: 2.0000 | INHALATION_SPRAY | RESPIRATORY_TRACT | Status: DC | PRN

## 2022-10-06 MED ORDER — IOHEXOL 350 MG/ML SOLN
75.0000 mL | Freq: Once | INTRAVENOUS | Status: AC | PRN
  Administered 2022-10-06: 75 mL via INTRAVENOUS

## 2022-10-06 MED ORDER — HYDROXYZINE HCL 25 MG PO TABS
50.0000 mg | ORAL_TABLET | Freq: Three times a day (TID) | ORAL | Status: DC | PRN
  Administered 2022-10-06 – 2022-10-11 (×5): 50 mg via ORAL
  Filled 2022-10-06 (×5): qty 2

## 2022-10-06 MED ORDER — LORAZEPAM 2 MG/ML IJ SOLN: 1.0000 mg | INTRAMUSCULAR | Status: DC | PRN

## 2022-10-06 MED ORDER — IOHEXOL 9 MG/ML PO SOLN
500.0000 mL | ORAL | Status: AC
  Administered 2022-10-06 (×2): 500 mL via ORAL

## 2022-10-06 MED ORDER — LORAZEPAM 2 MG/ML IJ SOLN
1.0000 mg | Freq: Once | INTRAMUSCULAR | Status: AC
  Administered 2022-10-06: 2 mg via INTRAVENOUS
  Filled 2022-10-06: qty 1

## 2022-10-06 MED ORDER — LORAZEPAM 2 MG/ML IJ SOLN: 2.0000 mg | Freq: Once | INTRAMUSCULAR | Status: DC

## 2022-10-06 MED ORDER — LORAZEPAM 2 MG/ML IJ SOLN
4.0000 mg | Freq: Once | INTRAMUSCULAR | Status: AC
  Administered 2022-10-07: 4 mg via INTRAVENOUS
  Filled 2022-10-06: qty 2

## 2022-10-06 MED ORDER — MELATONIN 3 MG PO TABS
3.0000 mg | ORAL_TABLET | Freq: Every evening | ORAL | Status: DC | PRN
  Administered 2022-10-07 – 2022-10-13 (×6): 3 mg via ORAL
  Filled 2022-10-06 (×6): qty 1

## 2022-10-06 MED ORDER — LORAZEPAM 2 MG/ML IJ SOLN
1.0000 mg | Freq: Once | INTRAMUSCULAR | Status: AC
  Administered 2022-10-06: 1 mg via INTRAVENOUS
  Filled 2022-10-06: qty 1

## 2022-10-06 MED ORDER — MIDAZOLAM HCL 2 MG/2ML IJ SOLN: 2.0000 mg | INTRAMUSCULAR | Status: DC | PRN

## 2022-10-06 MED ORDER — ALBUTEROL SULFATE (2.5 MG/3ML) 0.083% IN NEBU
2.5000 mg | INHALATION_SOLUTION | RESPIRATORY_TRACT | Status: DC | PRN
  Administered 2022-10-07: 2.5 mg via RESPIRATORY_TRACT
  Filled 2022-10-06: qty 3

## 2022-10-06 NOTE — Progress Notes (Signed)
Subjective: States she is feeling well today, tearful because she doesn't like being in a hospital. Husband, daughter and grand daughter at bedside  ROS: negative except above Examination  Vital signs in last 24 hours: Temp:  [97.6 F (36.4 C)-97.8 F (36.6 C)] 97.6 F (36.4 C) (04/26 0812) Pulse Rate:  [90-118] 90 (04/26 0812) Resp:  [12-28] 20 (04/26 0812) BP: (141-174)/(78-153) 149/95 (04/26 0812) SpO2:  [83 %-99 %] 99 % (04/26 0354) Weight:  [65 kg] 65 kg (04/25 1759)  General: lying in bed, NAD Neuro: MS: Alert, oriented, follows commands CN: pupils equal and reactive,  EOMI, face symmetric, tongue midline, normal sensation over face, Motor: 5/5 strength in all 4 extremities Reflexes: 2+ bilaterally over patella, biceps, plantars: flexor Coordination: normal Gait: not tested  Basic Metabolic Panel: Recent Labs  Lab 10/05/22 1732  NA 136  138  K 3.4*  3.7  CL 101  102  CO2 20*  GLUCOSE 159*  152*  BUN 18  17  CREATININE 1.16*  1.20*  CALCIUM 9.3  MG 1.9    CBC: Recent Labs  Lab 10/05/22 1732 10/06/22 0649  WBC 8.3 9.1  NEUTROABS 6.4  --   HGB 14.6  15.0 14.3  HCT 44.3  44.0 43.8  MCV 92.3 92.0  PLT 313 317     Coagulation Studies: Recent Labs    10/05/22 1732  LABPROT 13.0  INR 1.0    Imaging MRI Brain wo contrast 10/05/2022:  1. Limited study due to the patient's inability to tolerate the full length of the exam and motion. 2. 4.2 x 3.5 cm dural based mass at the parasagittal left parietal convexity with associated vasogenic edema and localized left-to-right shift. This is incompletely assessed on this limited exam. No other visible mass or other acute intracranial abnormality. 3. Underlying moderately advanced chronic microvascular ischemic disease.     ASSESSMENT AND PLAN: 72 yo woman with hx COPD, HTN, HL, tobacco abuse who presents with aphasia and R sided weakness favored to be 2/2 seizure 2/2 dural based mass at the  parasagittal left parietal convexity with associated vasogenic edema and localized left-to-right shift.   Left parietal mass Seizure - Continue Keppra 500mg  BID - Video eeg with MRI compatible leads to look for intermittent seizures - Neurosug consult pending - Did discuss with family at bedside the MRI findings - Continue seizure precautions - PRN IV versed for clinical seizure - Management of rest of comorbidities per pri team  I have spent a total of  36 minutes with the patient reviewing hospital notes,  test results, labs and examining the patient as well as establishing an assessment and plan that was discussed personally with the patient.  > 50% of time was spent in direct patient care.    Lindie Spruce Epilepsy [facility] For questions after 5pm please refer to AMION to reach the Neurologist on call

## 2022-10-06 NOTE — Progress Notes (Signed)
Called spoke to nurse Sonya, Pt did not have a successful last MRI. She will speak with Dr. to see if Patient will be going back to MRI this morning. Call back to determine when to place EEG electrodes.

## 2022-10-06 NOTE — Plan of Care (Signed)
  Problem: Education: Goal: Knowledge of General Education information will improve Description: Including pain rating scale, medication(s)/side effects and non-pharmacologic comfort measures Outcome: Progressing   Problem: Health Behavior/Discharge Planning: Goal: Ability to manage health-related needs will improve Outcome: Progressing   Problem: Clinical Measurements: Goal: Ability to maintain clinical measurements within normal limits will improve Outcome: Progressing Goal: Will remain free from infection Outcome: Progressing Goal: Diagnostic test results will improve Outcome: Progressing Goal: Respiratory complications will improve Outcome: Progressing Goal: Cardiovascular complication will be avoided Outcome: Progressing   Problem: Activity: Goal: Risk for activity intolerance will decrease Outcome: Progressing   Problem: Nutrition: Goal: Adequate nutrition will be maintained Outcome: Progressing   Problem: Coping: Goal: Level of anxiety will decrease Outcome: Progressing   Problem: Elimination: Goal: Will not experience complications related to bowel motility Outcome: Progressing Goal: Will not experience complications related to urinary retention Outcome: Progressing   Problem: Pain Managment: Goal: General experience of comfort will improve Outcome: Progressing   Problem: Safety: Goal: Ability to remain free from injury will improve Outcome: Progressing   Problem: Skin Integrity: Goal: Risk for impaired skin integrity will decrease Outcome: Progressing   Problem: Education: Goal: Knowledge of disease or condition will improve Outcome: Progressing Goal: Knowledge of secondary prevention will improve (MUST DOCUMENT ALL) Outcome: Progressing Goal: Knowledge of patient specific risk factors will improve Loraine Leriche N/A or DELETE if not current risk factor) Outcome: Progressing   Problem: Ischemic Stroke/TIA Tissue Perfusion: Goal: Complications of ischemic  stroke/TIA will be minimized Outcome: Progressing   Problem: Coping: Goal: Will verbalize positive feelings about self Outcome: Progressing Goal: Will identify appropriate support needs Outcome: Progressing   Problem: Self-Care: Goal: Ability to participate in self-care as condition permits will improve Outcome: Progressing

## 2022-10-06 NOTE — Progress Notes (Signed)
Called back, Nurse still has not heard back from Dr. And Nurse is in a meeting. I will head up and paste regular non-conditional EEG electrodes due to time allotment.

## 2022-10-06 NOTE — Progress Notes (Signed)
LTM EEG hooked up and running - no initial skin breakdown - push button tested - [facility] monitoring.  Pt has MRI electrodes attached.

## 2022-10-06 NOTE — Progress Notes (Signed)
Pt asleep as per RN, EEG being attempted in the morning

## 2022-10-06 NOTE — Progress Notes (Signed)
   10/06/22 1000  Spiritual Encounters  Type of Visit Initial  Care provided to: Pt and family  Referral source Nurse (RN/NT/LPN)  Reason for visit Advance directives  OnCall Visit No   Ch responded to request for AD. Family was at bedside. Pt said she did not request AD. She will page Ch after talking to her husband. No follow- needed at this time.

## 2022-10-06 NOTE — Progress Notes (Signed)
PROGRESS NOTE   Penny Hicks  WNU:[id number] DOB: 1950/07/16 DOA: 10/05/2022 PCP: Assunta Found, MD   Brief Narrative:   72 year old white female home dwelling HTN HLD arthritis hypothyroid COPD HFrEF prior echo 30-35% Tendency to hyponatremia-?  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 Jake Samples input in advance-thank you Continue at this time Decadron 6 every 6 IV, Keppra 500 IV every 12, saline 50 cc/H If she has a seizure as needed Ativan is ordered IV 1 mg She is lucid enough at this time potentially for diet-we will start with clear liquids Patient understandably emotional regarding diagnosis-discussed with husband Brett Canales at bedside-chaplain consulted to help with life transition discussions Patient/family understand stepwise fashion in which this needs to be approached with multiple specialists involvement  HFrEF last EF 30-35% Order nonemergent echo for restratification in case surgery is needed  HTN Hold lisinopril 20 at this time and monitor trends of creatinine  COPD, Gold stage II As needed albuterol reordered-monitor   DVT prophylaxis: Heparin Code Status: Full Family Communication: Discussed with husband Brett Canales at bedside Disposition:  Status is: Inpatient Remains inpatient appropriate because:   May require meningioma resection?      Subjective: Confabulates the  date thinks it is 1973, also cannot remember her husband's name Otherwise she is able to tell me that this is March, season this spring-does not remember events surrounding her coming to the hospital, only remembers "everything just became so confused"   Objective: Vitals:   10/05/22 2030 10/05/22 2100 10/05/22 2324 10/06/22 0354  BP: (!) 153/94 (!) 155/95 (!) 145/95 (!) 174/99  Pulse:   94 98  Resp: 19 14 20 20   Temp:   97.8 F (36.6 C) 97.7 F (36.5 C)  TempSrc:   Oral Axillary  SpO2:   93% 99%  Weight:      Height:        Intake/Output Summary (Last 24 hours) at 10/06/2022 0746 Last data filed at 10/06/2022 0347 Gross per 24 hour  Intake 772.96 ml  Output 550 ml  Net 222.96 ml   Filed Weights   10/05/22 1759  Weight: 65 kg    Examination:  EOMI NCAT looking older than stated age, no icterus no pallor No submandibular lymphadenopathy Vision by direct confrontation is intact S1-S2 cannot appreciate murmur Power is 5/5 motor to upper extremities lower extremities with no deficits She does not understand how to perform plantar dorsiflexion and this may be some form of receptive aphasia She is quite tearful at the news given however I discussed this with her and her husband and she is aware that chaplain will be by to talk about this  Data Reviewed: personally reviewed   CBC    Component Value Date/Time   WBC 8.3 10/05/2022 1732   RBC 4.80 10/05/2022 1732   HGB 15.0 10/05/2022 1732   HGB 14.6 10/05/2022 1732   HCT 44.0 10/05/2022 1732   HCT 44.3 10/05/2022 1732   PLT 313 10/05/2022 1732   MCV 92.3  10/05/2022 1732   MCH 30.4 10/05/2022 1732   MCHC 33.0 10/05/2022 1732   RDW 12.4 10/05/2022 1732   LYMPHSABS 1.4 10/05/2022 1732   MONOABS 0.3 10/05/2022 1732   EOSABS 0.0 10/05/2022 1732   BASOSABS 0.0 10/05/2022 1732      Latest Ref Rng & Units 10/05/2022    5:32 PM 02/11/2020    7:02 AM 02/10/2020    6:09 AM  CMP  Glucose 70 - 99 mg/dL 70 - 99 mg/dL 161     096  045  409   BUN 8 - 23 mg/dL 8 - 23 mg/dL 17    18  18  19    Creatinine 0.44 - 1.00 mg/dL 8.11 - 9.14 mg/dL 7.82    9.56  2.13  0.86   Sodium 135 - 145 mmol/L 135 - 145 mmol/L 138    136  133  131   Potassium 3.5 - 5.1 mmol/L 3.5 - 5.1 mmol/L 3.7    3.4  4.1  3.5   Chloride 98 - 111 mmol/L 98 - 111 mmol/L 102    101  94  94   CO2 22 - 32 mmol/L 20  28  25    Calcium 8.9 - 10.3 mg/dL 9.3  8.8  8.6   Total Protein 6.5 - 8.1 g/dL 7.3     Total Bilirubin 0.3 - 1.2 mg/dL 0.6     Alkaline Phos 38 - 126 U/L 64     AST 15 - 41 U/L 27     ALT 0 - 44 U/L 16        Radiology Studies: MR BRAIN WO CONTRAST  Result Date: 10/06/2022 CLINICAL DATA:  Initial evaluation for brain/CNS neoplasm. EXAM: MRI HEAD WITHOUT CONTRAST TECHNIQUE: Multiplanar, multiecho pulse sequences of the brain and surrounding structures were obtained without intravenous contrast. COMPARISON:  Prior studies from 10/05/2022. FINDINGS: Brain: Examination is technically limited as the patient was unable to tolerate the study. Diffusion-weighted sequences and axial FLAIR sequence only were obtained. Additionally, provided images are markedly degraded by motion. Cerebral volume within normal limits. Patchy T2/FLAIR hyperintensity involving the supratentorial cerebral white matter, most characteristic of chronic microvascular ischemic disease, moderately advanced in nature. No evidence for acute or subacute ischemia. No visible areas of chronic cortical infarction. Previously identified dural based mass at the parasagittal left parietal convexity again seen. Lesion measures 4.2 x 3.5 cm. Associated vasogenic edema within the adjacent posterior left cerebral hemisphere. 7 mm of localized shift at the posterior falx. Lesions incompletely characterized on this limited exam. No other visible mass lesion or mass effect. No hydrocephalus or extra-axial fluid collection. Vascular: Major intracranial vascular flow voids are grossly  maintained at the skull base. Skull and upper cervical spine: Grossly unremarkable. Sinuses/Orbits: Globes and orbital soft tissues grossly within normal limits. Paranasal sinuses are grossly clear. No significant mastoid effusion. Other: None. IMPRESSION: 1. Limited study due to the patient's inability to tolerate the full length of the exam and motion. 2. 4.2 x 3.5 cm dural based mass at the parasagittal left parietal convexity with associated vasogenic edema and localized left-to-right shift. This is incompletely assessed on this limited exam. No other visible mass or other acute intracranial abnormality. 3. Underlying moderately advanced chronic microvascular ischemic disease. Electronically Signed   By: Rise Mu M.D.   On: 10/06/2022 02:21   CT ANGIO HEAD NECK W WO CM W PERF (CODE STROKE)  Result Date: 10/05/2022 CLINICAL DATA:  Stroke suspected EXAM: CT ANGIOGRAPHY HEAD TECHNIQUE:  Multidetector CT imaging of the head was performed using the standard protocol during bolus administration of intravenous contrast. Multiplanar CT image reconstructions and MIPs were obtained to evaluate the vascular anatomy. RADIATION DOSE REDUCTION: This exam was performed according to the departmental dose-optimization program which includes automated exposure control, adjustment of the mA and/or kV according to patient size and/or use of iterative reconstruction technique. CONTRAST:  OMNIPAQUE IOHEXOL 350 MG/ML SOLN COMPARISON:  Same day CT head FINDINGS: CT HEAD Redemonstrated is a dural-based lesion in the left parasagittal parietal lobe with surrounding vasogenic edema. Please see same day noncontrast enhanced CT brain for additional findings. This mass abuts the superior sagittal sinus but does not definitively invade it. CTA HEAD Anterior circulation: No significant stenosis, proximal occlusion, aneurysm, or vascular malformation. Posterior circulation: No significant stenosis, proximal occlusion,  aneurysm, or vascular malformation. Venous sinuses: As permitted by contrast timing, patent. Anatomic variants: Fetal PCA on the left. Review of the MIP images confirms the above findings. IMPRESSION: 1. No intracranial large vessel occlusion or significant stenosis. 2. 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 This mass abuts the superior sagittal sinus but does not definitively invade it. Please see same day noncontrast enhanced CT brain for additional findings. Electronically Signed   By: Lorenza Cambridge M.D.   On: 10/05/2022 18:33   CT HEAD CODE STROKE WO CONTRAST  Result Date: 10/05/2022 CLINICAL DATA:  Code stroke.  Right-sided weakness. EXAM: CT HEAD WITHOUT CONTRAST TECHNIQUE: Contiguous axial images were obtained from the base of the skull through the vertex without intravenous contrast. RADIATION DOSE REDUCTION: This exam was performed according to the departmental dose-optimization program which includes automated exposure control, adjustment of the mA and/or kV according to patient size and/or use of iterative reconstruction technique. COMPARISON:  None Available. FINDINGS: Brain: No hemorrhage. No extra-axial fluid collection. No CT evidence of an acute infarct. There is a 4.2 x 3.5 x 3.9 cm likely dural-based lesion along the parasagittal left parietal lobe. There is vasogenic edema surrounding this lesion, suggestive of parenchymal invasion. This lesion causes mass effect on the left lateral ventricular system. Vascular: No hyperdense vessel or unexpected calcification. Skull: Normal. Negative for fracture or focal lesion. Sinuses/Orbits: No middle ear or mastoid effusion. Other: None. ASPECTS ([place] Stroke Program Early CT Score: 10 IMPRESSION: 1. 4.2 cm likely dural-based lesion along the parasagittal left parietal lobe with vasogenic edema surrounding this lesion, suggestive of parenchymal invasion. This lesion causes mass  effect on the left lateral ventricular system. Recommend MRI brain with and without contrast for further evaluation. 2. No acute intracranial hemorrhage or CT evidence of an acute infarct. Findings were paged to Dr. Selina Cooley via Loretha Stapler on paging system on 10/05/22 at 5:50 PM. Electronically Signed   By: Lorenza Cambridge M.D.   On: 10/05/2022 17:57     Scheduled Meds:  dexamethasone (DECADRON) injection  6 mg Intravenous Q6H   heparin  5,000 Units Subcutaneous Q8H   LORazepam  1-2 mg Intravenous Once   pantoprazole (PROTONIX) IV  40 mg Intravenous Q24H   Continuous Infusions:  sodium chloride 50 mL/hr at 10/06/22 0657   levETIRAcetam Stopped (10/06/22 0542)     LOS: 1 day   Time spent: 48  Rhetta Mura, MD [facility] To contact the attending provider between 7A-7P or the covering provider during after hours 7P-7A, please log into the web site [email or web address] and access using universal [place] password for that web site. If  you do not have the password, please call the hospital operator.  10/06/2022, 7:46 AM

## 2022-10-06 NOTE — Consult Note (Signed)
   Providing Compassionate, Quality Care - Together  Neurosurgery Consult  Referring physician: Dr. Mahala Menghini Reason for referral: Intracranial tumor  Chief Complaint: Right-sided weakness  History of Present Illness: This is a 72 year old right-handed female, past medical history of hypertension hypercholesterolemia, hypothyroidism, COPD, CHF, with complaints of acute onset right-sided weakness yesterday.  Code stroke was obtained, there is found to be a left parietal extra-axial lesion with significant hemispheric edema and mass effect.  She was admitted for further workup.  At this time she has no complaints of significant right-sided weakness, states this is somewhat improved since being on steroid.  She denies any seizures, but did have some concern of seizure and therefore is on Keppra and currently having EEG hooked up.  She denies any bowel or bladder changes, denies any history of cancers.  Denies any history of seizures. She is accompanied by her husband at bedside and multiple other family members.  Medications: I have reviewed the patient's current medications. Allergies: No Known Allergies  History reviewed. No pertinent family history. Social History:  has no history on file for tobacco use, alcohol use, and drug use.  ROS: All pertinent positives and negatives are listed in HPI above  Physical Exam:  Vital signs in last 24 hours: Temp:  [98 F (36.7 C)-98.3 F (36.8 C)] 98 F (36.7 C) (07/25 1814) Pulse Rate:  [58-128] 65 (07/26 0746) Resp:  [11-18] 14 (07/26 0217) BP: (138-182)/(65-125) 153/88 (07/26 0700) SpO2:  [91 %-98 %] 96 % (07/26 0746) PE: Awake alert oriented x 3, no acute distress PERRL CN 2-12 intact Face symmetric Speech fluent and appropriate EOMI Full strength in left upper and lower extremity Right upper and lower extremity 4+/5 throughout No drift No dysmetria   Impression/Assessment:  72 year old female with  Intracranial mass, with  vasogenic edema  Plan:  -Recommend continue further workup.  MRI is incomplete.  I will order a new MRI with and without contrast with stereotactic protocol -Continue Decadron -Continue antiepileptics -Will also need CT chest abdomen pelvis for metastatic workup  Thank you for allowing me to participate in this patient's care.  Please do not hesitate to call with questions or concerns.   Monia Pouch, DO Neurosurgeon [facility] Cell: [phone]

## 2022-10-07 ENCOUNTER — Inpatient Hospital Stay (HOSPITAL_COMMUNITY): Payer: 59

## 2022-10-07 DIAGNOSIS — R569 Unspecified convulsions: Secondary | ICD-10-CM | POA: Diagnosis not present

## 2022-10-07 LAB — CBC
HCT: 43.1 % (ref 36.0–46.0)
Hemoglobin: 14.1 g/dL (ref 12.0–15.0)
MCH: 30.1 pg (ref 26.0–34.0)
MCHC: 32.7 g/dL (ref 30.0–36.0)
MCV: 91.9 fL (ref 80.0–100.0)
Platelets: 324 10*3/uL (ref 150–400)
RBC: 4.69 MIL/uL (ref 3.87–5.11)
RDW: 12.6 % (ref 11.5–15.5)
WBC: 16.1 10*3/uL — ABNORMAL HIGH (ref 4.0–10.5)
nRBC: 0 % (ref 0.0–0.2)

## 2022-10-07 LAB — COMPREHENSIVE METABOLIC PANEL
ALT: 21 U/L (ref 0–44)
AST: 38 U/L (ref 15–41)
Albumin: 3.3 g/dL — ABNORMAL LOW (ref 3.5–5.0)
Alkaline Phosphatase: 53 U/L (ref 38–126)
Anion gap: 6 (ref 5–15)
BUN: 17 mg/dL (ref 8–23)
CO2: 25 mmol/L (ref 22–32)
Calcium: 9 mg/dL (ref 8.9–10.3)
Chloride: 108 mmol/L (ref 98–111)
Creatinine, Ser: 0.96 mg/dL (ref 0.44–1.00)
GFR, Estimated: >60 mL/min (ref >60)
Glucose, Bld: 159 mg/dL — ABNORMAL HIGH (ref 70–99)
Potassium: 3.7 mmol/L (ref 3.5–5.1)
Sodium: 139 mmol/L (ref 135–145)
Total Bilirubin: 0.7 mg/dL (ref 0.3–1.2)
Total Protein: 6.1 g/dL — ABNORMAL LOW (ref 6.5–8.1)

## 2022-10-07 MED ORDER — DEXAMETHASONE SODIUM PHOSPHATE 4 MG/ML IJ SOLN
4.0000 mg | Freq: Four times a day (QID) | INTRAMUSCULAR | Status: DC
  Administered 2022-10-07 – 2022-10-11 (×14): 4 mg via INTRAVENOUS
  Filled 2022-10-07 (×14): qty 1

## 2022-10-07 MED ORDER — METOPROLOL TARTRATE 12.5 MG HALF TABLET
12.5000 mg | ORAL_TABLET | Freq: Two times a day (BID) | ORAL | Status: DC
  Administered 2022-10-07 – 2022-10-09 (×4): 12.5 mg via ORAL
  Filled 2022-10-07 (×4): qty 1

## 2022-10-07 NOTE — Progress Notes (Signed)
   Providing Compassionate, Quality Care - Together  NEUROSURGERY PROGRESS NOTE   S: No significant changes, did not tolerate MRI  O: EXAM:  BP (!) 150/80 (BP Location: Left Arm)   Pulse 82   Temp 97.6 F (36.4 C) (Oral)   Resp 18   Ht 5\' 4"  (1.626 m)   Wt 65 kg   SpO2 92%   BMI 24.60 kg/m   Awake, disoriented PERRL Answers questions appropriately Face symmetric Moves all extremities equally No drift  ASSESSMENT:  72 y.o. female with   Left parietal extra-axial mass with midline shift  PLAN: -Continue antiepileptics per neurology -Will attempt MRI with anesthesia as she did not tolerate sedation -Possible surgical intervention pending MRI findings for tumor resection -Change Decadron to 4 mg every 6 hours -Updated husband at bedside    Thank you for allowing me to participate in this patient's care.  Please do not hesitate to call with questions or concerns.   Monia Pouch, DO Neurosurgeon [facility] Cell: [phone]

## 2022-10-07 NOTE — Procedures (Signed)
Patient Name: Penny Hicks  MRN: [id number]  Epilepsy Attending: Charlsie Quest  Referring Physician/Provider: Gordy Councilman, MD  Duration: 10/06/2022 1045  to 10/07/2022 1315  Patient history: 72 yo woman with hx COPD, HTN, HL, tobacco abuse who presents with aphasia and R sided weakness favored to be 2/2 seizure 2/2 dural based mass at the parasagittal left parietal convexity with associated vasogenic edema and localized left-to-right shift. EEG to evaluate for seizure  Level of alertness: Awake, asleep  AEDs during EEG study: LEV  Technical aspects: This EEG study was done with scalp electrodes positioned according to the 10-20 International system of electrode placement. Electrical activity was reviewed with band pass filter of 1-70Hz , sensitivity of 7 uV/mm, display speed of 90mm/sec with a 60Hz  notched filter applied as appropriate. EEG data were recorded continuously and digitally stored.  Video monitoring was available and reviewed as appropriate.  Description: The posterior dominant rhythm consists of 9 Hz activity of moderate voltage (25-35 uV) seen predominantly in posterior head regions, symmetric and reactive to eye opening and eye closing. Sleep was characterized by vertex waves, sleep spindles (12 to 14 Hz), maximal frontocentral region. Hyperventilation and photic stimulation were not performed.     Study was technically difficult due to significant electrode artifact. Study was interrupted between 10/06/2022 1657 to 1832, 10/07/2022 0419 to 0502 for other tests.   IMPRESSION: This technically difficult study is within normal limits. No seizures or epileptiform discharges were seen throughout the recording.   A normal interictal EEG does not exclude the diagnosis of epilepsy.  Kathyleen Radice Annabelle Harman

## 2022-10-07 NOTE — Progress Notes (Signed)
Rn went to MRI with paitent with IV 4mg  given prior.  Pt is restless, incooperative, would not lay on her back.  Pt brought back to the floor remain resless.

## 2022-10-07 NOTE — Progress Notes (Signed)
PROGRESS NOTE   Penny Hicks  RUE:[id number] DOB: 09/02/1950 DOA: 10/05/2022 PCP: Assunta Found, MD   Brief Narrative:   72 year old white female home dwelling HTN HLD arthritis hypothyroid COPD HFrEF prior echo 30-35% Tendency to hyponatremia-?  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 causing mass effect, confusion Continues Decadron 4 every 6 IV, Keppra 500 IV every 12, saline 50 cc/H If she has a seizure as needed Ativan is ordered IV 1 mg Patient/family understand stepwise fashion in which this needs to be approached with multiple specialists involvement Await stereotactic MRI under anesthesia per neurosurgery  HFrEF last EF 30-35% Await nonemergent echo for restratification in case surgery is needed  HTN Hold lisinopril 20, start metoprolol 12.5 twice daily because of uncontrolled pressure  COPD, Gold stage II As needed albuterol reordered-monitor   DVT prophylaxis: Heparin Code Status: Full Family Communication: Discussed with husband Brett Canales at bedside Disposition:  Status is: Inpatient Remains inpatient appropriate because:   May require meningioma resection?      Subjective:  Was up most of the night with trial to get MRI despite Ativan was not able to do so I spoke with the husband and did not disturb her   Objective: Vitals:   10/07/22 0800 10/07/22 0829 10/07/22 1200 10/07/22 1608  BP: (!) 150/80   (!) 171/107   Pulse: 82   97  Resp: 18  18 18   Temp: 97.6 F (36.4 C)  (!) 97.5 F (36.4 C) 97.8 F (36.6 C)  TempSrc: Oral  Oral Oral  SpO2:  92% 93% 96%  Weight:      Height:        Intake/Output Summary (Last 24 hours) at 10/07/2022 1752 Last data filed at 10/06/2022 1945 Gross per 24 hour  Intake --  Output 350 ml  Net -350 ml    Filed Weights   10/05/22 1759  Weight: 65 kg    Examination:  EOMI NCAT looking older than stated age, no icterus no pallor No submandibular lymphadenopathy S1-S2 no murmur Chest clear Neuro deferred  Data Reviewed: personally reviewed   CBC    Component Value Date/Time   WBC 16.1 (H) 10/07/2022 0336   RBC 4.69 10/07/2022 0336   HGB 14.1 10/07/2022 0336   HCT 43.1 10/07/2022 0336   PLT 324 10/07/2022 0336   MCV 91.9 10/07/2022 0336   MCH 30.1 10/07/2022 0336   MCHC 32.7 10/07/2022 0336   RDW 12.6 10/07/2022 0336   LYMPHSABS 1.4 10/05/2022 1732   MONOABS 0.3 10/05/2022 1732   EOSABS 0.0 10/05/2022 1732   BASOSABS 0.0 10/05/2022 1732      Latest Ref Rng & Units 10/07/2022    3:36 AM 10/05/2022    5:32 PM 02/11/2020    7:02 AM  CMP  Glucose 70 - 99 mg/dL 147  829    562  130   BUN 8 - 23 mg/dL 17  17    18  18    Creatinine 0.44 - 1.00 mg/dL 8.65  7.84    6.96  0.79   Sodium 135 - 145 mmol/L 139  138    136  133   Potassium 3.5 - 5.1 mmol/L 3.7  3.7    3.4  4.1   Chloride 98 - 111 mmol/L 108  102    101  94   CO2 22 - 32 mmol/L 25  20  28    Calcium 8.9 - 10.3 mg/dL 9.0  9.3  8.8   Total Protein 6.5 - 8.1 g/dL 6.1  7.3    Total Bilirubin 0.3 - 1.2 mg/dL 0.7  0.6    Alkaline Phos 38 - 126 U/L 53  64    AST 15 - 41 U/L 38  27    ALT 0 - 44 U/L 21  16       Radiology Studies: Overnight EEG with video  Result Date: 10/07/2022 Charlsie Quest, MD     10/07/2022  7:35 AM Patient Name: Penny Hicks MRN: [id number] Epilepsy Attending: Charlsie Quest Referring Physician/Provider: Gordy Councilman, MD Duration: 10/06/2022 1045  to  10/07/2022 0730 Patient history: 72 yo woman with hx COPD, HTN, HL, tobacco abuse who presents with aphasia and R sided weakness favored to be 2/2 seizure 2/2 dural based mass at the parasagittal left parietal convexity with associated vasogenic edema and localized left-to-right shift. EEG to evaluate for seizure Level of alertness: Awake, asleep AEDs during EEG study: LEV Technical aspects: This EEG study was done with scalp electrodes positioned according to the 10-20 International system of electrode placement. Electrical activity was reviewed with band pass filter of 1-70Hz , sensitivity of 7 uV/mm, display speed of 44mm/sec with a 60Hz  notched filter applied as appropriate. EEG data were recorded continuously and digitally stored.  Video monitoring was available and reviewed as appropriate. Description: The posterior dominant rhythm consists of 9 Hz activity of moderate voltage (25-35 uV) seen predominantly in posterior head regions, symmetric and reactive to eye opening and eye closing. Sleep was characterized by vertex waves, sleep spindles (12 to 14 Hz), maximal frontocentral region. Hyperventilation and photic stimulation were not performed.   Study was technically difficult due to significant electrode artifact. Study was interrupted between 10/06/2022 1657 to 1832, 10/07/2022 0419 to 0502 for other tests. IMPRESSION: This technically difficult study is within normal limits. No seizures or epileptiform discharges were seen throughout the recording. A normal interictal EEG does not exclude the diagnosis of epilepsy. Charlsie Quest   CT CHEST ABDOMEN PELVIS W CONTRAST  Result Date: 10/06/2022 CLINICAL DATA:  Brain metastases, unknown primary * Tracking Code: BO * EXAM: CT CHEST, ABDOMEN, AND PELVIS WITH CONTRAST TECHNIQUE: Multidetector CT imaging of the chest, abdomen and pelvis was performed following the standard protocol during bolus administration of intravenous contrast. RADIATION DOSE REDUCTION:  This exam was performed according to the departmental dose-optimization program which includes automated exposure control, adjustment of the mA and/or kV according to patient size and/or use of iterative reconstruction technique. CONTRAST:  75mL OMNIPAQUE IOHEXOL 350 MG/ML SOLN additional oral enteric contrast COMPARISON:  Chest radiographs, 02/11/2020 FINDINGS: CT CHEST FINDINGS Cardiovascular: Aortic atherosclerosis. Normal heart size. Left coronary artery calcifications. No pericardial effusion. Mediastinum/Nodes: No enlarged mediastinal, hilar, or axillary lymph nodes. Thyroid gland, trachea, and esophagus demonstrate no significant findings. Lungs/Pleura: Mild centrilobular emphysema. Diffuse bilateral bronchial wall thickening. Background of fine centrilobular pulmonary nodules, most concentrated in the lung apices. Small fissural nodules of the posterior left upper lobe measuring 0.4 cm (series 5, image 33, 30). Scarring and or atelectasis of the  lung bases. No pleural effusion or pneumothorax. Musculoskeletal: No chest wall abnormality. CT ABDOMEN PELVIS FINDINGS Hepatobiliary: No solid liver abnormality is seen. Simple, benign liver cysts, for which no further follow-up or characterization is required. No gallstones or gallbladder wall thickening. Mild common bile duct dilatation, measuring up to 0.8 cm in caliber (series 6, image 38). Pancreas: Unremarkable. No pancreatic ductal dilatation or surrounding inflammatory changes. Spleen: Normal in size without significant abnormality. Adrenals/Urinary Tract: Adrenal glands are unremarkable. Kidneys are normal, without renal calculi, solid lesion, or hydronephrosis. Bladder is unremarkable. Stomach/Bowel: Stomach is within normal limits. Appendix appears normal. No evidence of bowel wall thickening, distention, or inflammatory changes. Sigmoid diverticulosis. Vascular/Lymphatic: Aortic atherosclerosis. No enlarged abdominal or pelvic lymph nodes. Reproductive:  No mass or other abnormality. Other: No abdominal wall hernia or abnormality. No ascites. Musculoskeletal: When compared to most recent chest radiographs dated 02/11/2020, there are new, although age indeterminate wedge deformities of T6 and T7 (series 7, image 82). Superior endplate deformity of T12 appears unchanged (series 7, image 84). IMPRESSION: 1. No specific evidence of metastatic disease in the chest, abdomen, or pelvis. 2. When compared to most recent chest radiographs dated 02/11/2020, there are new, although age indeterminate wedge deformities of T6 and T7. Superior endplate deformity of T12 appears unchanged. In the setting of other known metastatic disease, this should be regarded with substantial suspicion for pathologic fractures with underlying metastatic lesions. MRI may be helpful to further assess. 3. Small fissural nodules of the posterior left upper lobe measuring 0.4 cm, most likely benign incidental intrapulmonary lymph nodes. Metastatic disease strongly not favored. 4. Emphysema and diffuse bilateral bronchial wall thickening. Background of fine centrilobular pulmonary nodules, consistent with smoking-related respiratory bronchiolitis. 5. Small fissural nodules of the posterior left upper lobe measuring 0.4 cm, most likely benign incidental intrapulmonary lymph nodes. 6. Coronary artery disease. 7. Sigmoid diverticulosis without evidence of acute diverticulitis. Aortic Atherosclerosis (ICD10-I70.0) and Emphysema (ICD10-J43.9). Electronically Signed   By: Jearld Lesch M.D.   On: 10/06/2022 19:40   MR BRAIN WO CONTRAST  Result Date: 10/06/2022 CLINICAL DATA:  Initial evaluation for brain/CNS neoplasm. EXAM: MRI HEAD WITHOUT CONTRAST TECHNIQUE: Multiplanar, multiecho pulse sequences of the brain and surrounding structures were obtained without intravenous contrast. COMPARISON:  Prior studies from 10/05/2022. FINDINGS: Brain: Examination is technically limited as the patient was unable to  tolerate the study. Diffusion-weighted sequences and axial FLAIR sequence only were obtained. Additionally, provided images are markedly degraded by motion. Cerebral volume within normal limits. Patchy T2/FLAIR hyperintensity involving the supratentorial cerebral white matter, most characteristic of chronic microvascular ischemic disease, moderately advanced in nature. No evidence for acute or subacute ischemia. No visible areas of chronic cortical infarction. Previously identified dural based mass at the parasagittal left parietal convexity again seen. Lesion measures 4.2 x 3.5 cm. Associated vasogenic edema within the adjacent posterior left cerebral hemisphere. 7 mm of localized shift at the posterior falx. Lesions incompletely characterized on this limited exam. No other visible mass lesion or mass effect. No hydrocephalus or extra-axial fluid collection. Vascular: Major intracranial vascular flow voids are grossly maintained at the skull base. Skull and upper cervical spine: Grossly unremarkable. Sinuses/Orbits: Globes and orbital soft tissues grossly within normal limits. Paranasal sinuses are grossly clear. No significant mastoid effusion. Other: None. IMPRESSION: 1. Limited study due to the patient's inability to tolerate the full length of the exam and motion. 2. 4.2 x 3.5 cm dural based mass at the parasagittal left parietal convexity with associated vasogenic edema  and localized left-to-right shift. This is incompletely assessed on this limited exam. No other visible mass or other acute intracranial abnormality. 3. Underlying moderately advanced chronic microvascular ischemic disease. Electronically Signed   By: Rise Mu M.D.   On: 10/06/2022 02:21     Scheduled Meds:  dexamethasone (DECADRON) injection  4 mg Intravenous Q6H   heparin  5,000 Units Subcutaneous Q8H   pantoprazole (PROTONIX) IV  40 mg Intravenous Q24H   Continuous Infusions:  sodium chloride 50 mL/hr at 10/07/22 1208    levETIRAcetam 500 mg (10/07/22 0718)     LOS: 2 days   Time spent: 55  Rhetta Mura, MD [facility] To contact the attending provider between 7A-7P or the covering provider during after hours 7P-7A, please log into the web site [email or web address] and access using universal [place] password for that web site. If you do not have the password, please call the hospital operator.  10/07/2022, 5:52 PM

## 2022-10-07 NOTE — Progress Notes (Signed)
Patient came to MRI and would not cooperate and follow instructions, even after she was given meds.  This being the 3rd attempt at getting the MRI, General Anesthesia is probably the next step in getting the MRI.

## 2022-10-08 ENCOUNTER — Encounter (HOSPITAL_COMMUNITY): Payer: Self-pay | Admitting: Internal Medicine

## 2022-10-08 ENCOUNTER — Inpatient Hospital Stay (HOSPITAL_COMMUNITY): Payer: 59

## 2022-10-08 ENCOUNTER — Inpatient Hospital Stay (HOSPITAL_COMMUNITY): Payer: 59 | Admitting: Certified Registered"

## 2022-10-08 ENCOUNTER — Encounter (HOSPITAL_COMMUNITY)
Admission: EM | Disposition: A | Discharge status: Home / Self Care | Payer: Self-pay | Source: Home / Self Care | Attending: Family Medicine

## 2022-10-08 DIAGNOSIS — I509 Heart failure, unspecified: Secondary | ICD-10-CM | POA: Diagnosis not present

## 2022-10-08 DIAGNOSIS — I110 Hypertensive heart disease with heart failure: Secondary | ICD-10-CM | POA: Diagnosis not present

## 2022-10-08 DIAGNOSIS — J449 Chronic obstructive pulmonary disease, unspecified: Secondary | ICD-10-CM | POA: Diagnosis not present

## 2022-10-08 DIAGNOSIS — G9389 Other specified disorders of brain: Secondary | ICD-10-CM

## 2022-10-08 DIAGNOSIS — R569 Unspecified convulsions: Secondary | ICD-10-CM | POA: Diagnosis not present

## 2022-10-08 DIAGNOSIS — F17210 Nicotine dependence, cigarettes, uncomplicated: Secondary | ICD-10-CM

## 2022-10-08 HISTORY — PX: RADIOLOGY WITH ANESTHESIA: SHX6223

## 2022-10-08 LAB — COMPREHENSIVE METABOLIC PANEL WITH GFR
ALT: 26 U/L (ref 0–44)
AST: 45 U/L — ABNORMAL HIGH (ref 15–41)
Albumin: 3.2 g/dL — ABNORMAL LOW (ref 3.5–5.0)
Alkaline Phosphatase: 48 U/L (ref 38–126)
Anion gap: 5 (ref 5–15)
BUN: 19 mg/dL (ref 8–23)
CO2: 28 mmol/L (ref 22–32)
Calcium: 8.6 mg/dL — ABNORMAL LOW (ref 8.9–10.3)
Chloride: 105 mmol/L (ref 98–111)
Creatinine, Ser: 0.95 mg/dL (ref 0.44–1.00)
GFR, Estimated: >60 mL/min
Glucose, Bld: 136 mg/dL — ABNORMAL HIGH (ref 70–99)
Potassium: 3.8 mmol/L (ref 3.5–5.1)
Sodium: 138 mmol/L (ref 135–145)
Total Bilirubin: 0.7 mg/dL (ref 0.3–1.2)
Total Protein: 5.7 g/dL — ABNORMAL LOW (ref 6.5–8.1)

## 2022-10-08 LAB — CBC
HCT: 39.6 % (ref 36.0–46.0)
Hemoglobin: 13 g/dL (ref 12.0–15.0)
MCH: 30.4 pg (ref 26.0–34.0)
MCHC: 32.8 g/dL (ref 30.0–36.0)
MCV: 92.5 fL (ref 80.0–100.0)
Platelets: 293 10*3/uL (ref 150–400)
RBC: 4.28 MIL/uL (ref 3.87–5.11)
RDW: 12.6 % (ref 11.5–15.5)
WBC: 13.8 10*3/uL — ABNORMAL HIGH (ref 4.0–10.5)
nRBC: 0 % (ref 0.0–0.2)

## 2022-10-08 SURGERY — MRI WITH ANESTHESIA
Anesthesia: General

## 2022-10-08 MED ORDER — ONDANSETRON HCL 4 MG/2ML IJ SOLN: 4.0000 mg | Freq: Once | INTRAMUSCULAR | Status: DC | PRN

## 2022-10-08 MED ORDER — HYDRALAZINE HCL 20 MG/ML IJ SOLN
10.0000 mg | Freq: Once | INTRAMUSCULAR | Status: AC
  Administered 2022-10-08: 10 mg via INTRAVENOUS

## 2022-10-08 MED ORDER — LABETALOL HCL 5 MG/ML IV SOLN: 10.0000 mg | INTRAVENOUS | Status: AC | PRN

## 2022-10-08 MED ORDER — GLYCOPYRROLATE 0.2 MG/ML IJ SOLN
INTRAMUSCULAR | Status: DC | PRN
  Administered 2022-10-08: .2 mg via INTRAVENOUS

## 2022-10-08 MED ORDER — DEXAMETHASONE SODIUM PHOSPHATE 10 MG/ML IJ SOLN
INTRAMUSCULAR | Status: DC | PRN
  Administered 2022-10-08: 5 mg via INTRAVENOUS

## 2022-10-08 MED ORDER — CHLORHEXIDINE GLUCONATE 0.12 % MT SOLN: 15.0000 mL | Freq: Once | OROMUCOSAL | Status: AC

## 2022-10-08 MED ORDER — HYDRALAZINE HCL 20 MG/ML IJ SOLN
INTRAMUSCULAR | Status: AC
  Filled 2022-10-08: qty 1

## 2022-10-08 MED ORDER — ORAL CARE MOUTH RINSE: 15.0000 mL | Freq: Once | OROMUCOSAL | Status: AC

## 2022-10-08 MED ORDER — FENTANYL CITRATE (PF) 250 MCG/5ML IJ SOLN
INTRAMUSCULAR | Status: DC | PRN
  Administered 2022-10-08: 50 ug via INTRAVENOUS

## 2022-10-08 MED ORDER — CHLORHEXIDINE GLUCONATE 0.12 % MT SOLN
OROMUCOSAL | Status: AC
  Administered 2022-10-08: 15 mL via OROMUCOSAL
  Filled 2022-10-08: qty 15

## 2022-10-08 MED ORDER — LABETALOL HCL 5 MG/ML IV SOLN
INTRAVENOUS | Status: AC
  Administered 2022-10-08: 10 mg via INTRAVENOUS
  Filled 2022-10-08: qty 4

## 2022-10-08 MED ORDER — PHENYLEPHRINE HCL-NACL 20-0.9 MG/250ML-% IV SOLN
INTRAVENOUS | Status: DC | PRN
  Administered 2022-10-08: 15 ug/min via INTRAVENOUS

## 2022-10-08 MED ORDER — PHENYLEPHRINE IN HARD FAT 0.25 % RE SUPP
1.0000 | Freq: Two times a day (BID) | RECTAL | Status: DC
  Administered 2022-10-09 (×2): 1 via RECTAL
  Filled 2022-10-08 (×6): qty 1

## 2022-10-08 MED ORDER — LACTATED RINGERS IV SOLN: INTRAVENOUS | Status: DC

## 2022-10-08 MED ORDER — SUGAMMADEX SODIUM 200 MG/2ML IV SOLN
INTRAVENOUS | Status: DC | PRN
  Administered 2022-10-08: 200 mg via INTRAVENOUS

## 2022-10-08 MED ORDER — ETOMIDATE 2 MG/ML IV SOLN
INTRAVENOUS | Status: DC | PRN
  Administered 2022-10-08: 14 mg via INTRAVENOUS

## 2022-10-08 MED ORDER — LIDOCAINE 2% (20 MG/ML) 5 ML SYRINGE
INTRAMUSCULAR | Status: DC | PRN
  Administered 2022-10-08: 60 mg via INTRAVENOUS

## 2022-10-08 MED ORDER — GADOBUTROL 1 MMOL/ML IV SOLN
6.5000 mL | Freq: Once | INTRAVENOUS | Status: AC | PRN
  Administered 2022-10-08: 6.5 mL via INTRAVENOUS

## 2022-10-08 MED ORDER — ROCURONIUM BROMIDE 10 MG/ML (PF) SYRINGE
PREFILLED_SYRINGE | INTRAVENOUS | Status: DC | PRN
  Administered 2022-10-08: 50 mg via INTRAVENOUS

## 2022-10-08 MED ORDER — ONDANSETRON HCL 4 MG/2ML IJ SOLN
INTRAMUSCULAR | Status: DC | PRN
  Administered 2022-10-08: 4 mg via INTRAVENOUS

## 2022-10-08 MED ORDER — FENTANYL CITRATE (PF) 100 MCG/2ML IJ SOLN: 25.0000 ug | INTRAMUSCULAR | Status: DC | PRN

## 2022-10-08 NOTE — H&P (Signed)
Anesthesia H&P Update: History and Physical Exam reviewed; patient is OK for planned anesthetic and procedure. ? ?

## 2022-10-08 NOTE — Progress Notes (Signed)
PROGRESS NOTE   Penny Hicks  ZOX:[id number] DOB: [birth year] DOA: 10/05/2022 PCP: Assunta Found, MD   Brief Narrative:   72 year old white female home dwelling HTN HLD arthritis hypothyroid COPD HFrEF prior echo 30-35% Tendency to hyponatremia-?  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 Jake Samples involved in care as is epileptologist Dr. Melynda Ripple  Hospital-Problem based course  Possible meningioma brain causing mass effect, confusion which is still somewhat persistent Continues Decadron 4 every 6 IV, Keppra 500 IV every 12, saline 50 cc/H Seizure, has as needed Ativan i IV Long-term EEG overnight 4/27 is unrevealing for seizures although was a technically difficult study Patient/family understand stepwise fashion in which this needs to be approached with multiple specialists involvement Stereotactic MRI pending and Dr. Jake Samples will discuss with family hopefully tomorrow  Leukocytosis is expected in the setting of steroids she is afebrile  HFrEF last EF 30-35% Await nonemergent echo for restratification in case surgery is needed  HTN Hold lisinopril 20, start metoprolol 12.5 twice daily because of uncontrolled pressure  COPD, Gold stage II As needed albuterol reordered-monitor  Transaminitis is probably just a variant-monitor   DVT prophylaxis: Heparin Code Status: Full Family Communication: Discussed with husband Brett Canales as well as several family members today Disposition:  Status is:  Inpatient Remains inpatient appropriate because:   May require meningioma resection?  Awaiting MRI not ready to leave      Subjective:  Awake somewhat coherent still not completely there according to family members-does confabulate dates and times No pain no fever no chills Just back from MRI brain Long discussion with family at the bedside--they are dealing with this news fairly   Objective: Vitals:   10/08/22 1545 10/08/22 1600 10/08/22 1615 10/08/22 1621  BP: (!) 149/87 131/76  120/72  Pulse: 97 (!) 107  100  Resp: (!) 28 (!) 29  16  Temp:    98.4 F (36.9 C)  TempSrc:    Oral  SpO2: 100% 95% 95% 94%  Weight:      Height:        Intake/Output Summary (Last 24 hours) at 10/08/2022 1751 Last data filed at 10/08/2022 1749 Gross per 24 hour  Intake 2321.45 ml  Output --  Net 2321.45 ml    Filed Weights   10/05/22 1759 10/08/22 1222  Weight: 65 kg 65 kg    Examination:  EOMI NCAT looking older than stated age, no icterus no pallor Neck soft supple Tracks well with eyes no nystagmus Power is 5/5 bilaterally upper extremities Straight leg raise bilaterally is equal with no deficit Plantar dorsiflexion intact Reflexes are brisk  Data Reviewed: personally reviewed   CBC    Component Value Date/Time   WBC 13.8 (H) 10/08/2022 0307   RBC 4.28 10/08/2022 0307   HGB 13.0 10/08/2022 0307   HCT 39.6 10/08/2022 0307   PLT 293 10/08/2022 0307   MCV 92.5 10/08/2022 0307   MCH 30.4 10/08/2022 0307   MCHC 32.8 10/08/2022 0307   RDW 12.6 10/08/2022 0307   LYMPHSABS 1.4 10/05/2022 1732   MONOABS 0.3 10/05/2022 1732  EOSABS 0.0 10/05/2022 1732   BASOSABS 0.0 10/05/2022 1732      Latest Ref Rng & Units 10/08/2022    3:07 AM 10/07/2022    3:36 AM 10/05/2022    5:32 PM  CMP  Glucose 70 - 99 mg/dL 161  096  045    409   BUN 8 - 23 mg/dL 19  17  17    18    Creatinine 0.44 - 1.00 mg/dL 8.11  9.14  7.82    9.56   Sodium 135 - 145 mmol/L 138  139  138    136    Potassium 3.5 - 5.1 mmol/L 3.8  3.7  3.7    3.4   Chloride 98 - 111 mmol/L 105  108  102    101   CO2 22 - 32 mmol/L 28  25  20    Calcium 8.9 - 10.3 mg/dL 8.6  9.0  9.3   Total Protein 6.5 - 8.1 g/dL 5.7  6.1  7.3   Total Bilirubin 0.3 - 1.2 mg/dL 0.7  0.7  0.6   Alkaline Phos 38 - 126 U/L 48  53  64   AST 15 - 41 U/L 45  38  27   ALT 0 - 44 U/L 26  21  16       Radiology Studies: Overnight EEG with video  Result Date: 10/07/2022 Charlsie Quest, MD     10/08/2022  8:18 AM Patient Name: Penny Hicks MRN: [id number] Epilepsy Attending: Charlsie Quest Referring Physician/Provider: Gordy Councilman, MD Duration: 10/06/2022 1045  to 10/07/2022 1315 Patient history: 72 yo woman with hx COPD, HTN, HL, tobacco abuse who presents with aphasia and R sided weakness favored to be 2/2 seizure 2/2 dural based mass at the parasagittal left parietal convexity with associated vasogenic edema and localized left-to-right shift. EEG to evaluate for seizure Level of alertness: Awake, asleep AEDs during EEG study: LEV Technical aspects: This EEG study was done with scalp electrodes positioned according to the 10-20 International system of electrode placement. Electrical activity was reviewed with band pass filter of 1-70Hz , sensitivity of 7 uV/mm, display speed of 18mm/sec with a 60Hz  notched filter applied as appropriate. EEG data were recorded continuously and digitally stored.  Video monitoring was available and reviewed as appropriate. Description: The posterior dominant rhythm consists of 9 Hz activity of moderate voltage (25-35 uV) seen predominantly in posterior head regions, symmetric and reactive to eye opening and eye closing. Sleep was characterized by vertex waves, sleep spindles (12 to 14 Hz), maximal frontocentral region. Hyperventilation and photic stimulation were not performed.   Study was technically difficult due to significant electrode artifact. Study was interrupted between 10/06/2022 1657 to  1832, 10/07/2022 0419 to 0502 for other tests. IMPRESSION: This technically difficult study is within normal limits. No seizures or epileptiform discharges were seen throughout the recording. A normal interictal EEG does not exclude the diagnosis of epilepsy. Charlsie Quest   CT CHEST ABDOMEN PELVIS W CONTRAST  Result Date: 10/06/2022 CLINICAL DATA:  Brain metastases, unknown primary * Tracking Code: BO * EXAM: CT CHEST, ABDOMEN, AND PELVIS WITH CONTRAST TECHNIQUE: Multidetector CT imaging of the chest, abdomen and pelvis was performed following the standard protocol during bolus administration of intravenous contrast. RADIATION DOSE REDUCTION: This exam was performed according to the departmental dose-optimization program which includes automated exposure control, adjustment of the mA and/or kV according to patient size and/or use of iterative reconstruction technique. CONTRAST:  75mL OMNIPAQUE IOHEXOL  350 MG/ML SOLN additional oral enteric contrast COMPARISON:  Chest radiographs, 02/11/2020 FINDINGS: CT CHEST FINDINGS Cardiovascular: Aortic atherosclerosis. Normal heart size. Left coronary artery calcifications. No pericardial effusion. Mediastinum/Nodes: No enlarged mediastinal, hilar, or axillary lymph nodes. Thyroid gland, trachea, and esophagus demonstrate no significant findings. Lungs/Pleura: Mild centrilobular emphysema. Diffuse bilateral bronchial wall thickening. Background of fine centrilobular pulmonary nodules, most concentrated in the lung apices. Small fissural nodules of the posterior left upper lobe measuring 0.4 cm (series 5, image 33, 30). Scarring and or atelectasis of the lung bases. No pleural effusion or pneumothorax. Musculoskeletal: No chest wall abnormality. CT ABDOMEN PELVIS FINDINGS Hepatobiliary: No solid liver abnormality is seen. Simple, benign liver cysts, for which no further follow-up or characterization is required. No gallstones or gallbladder wall thickening. Mild common  bile duct dilatation, measuring up to 0.8 cm in caliber (series 6, image 38). Pancreas: Unremarkable. No pancreatic ductal dilatation or surrounding inflammatory changes. Spleen: Normal in size without significant abnormality. Adrenals/Urinary Tract: Adrenal glands are unremarkable. Kidneys are normal, without renal calculi, solid lesion, or hydronephrosis. Bladder is unremarkable. Stomach/Bowel: Stomach is within normal limits. Appendix appears normal. No evidence of bowel wall thickening, distention, or inflammatory changes. Sigmoid diverticulosis. Vascular/Lymphatic: Aortic atherosclerosis. No enlarged abdominal or pelvic lymph nodes. Reproductive: No mass or other abnormality. Other: No abdominal wall hernia or abnormality. No ascites. Musculoskeletal: When compared to most recent chest radiographs dated 02/11/2020, there are new, although age indeterminate wedge deformities of T6 and T7 (series 7, image 82). Superior endplate deformity of T12 appears unchanged (series 7, image 84). IMPRESSION: 1. No specific evidence of metastatic disease in the chest, abdomen, or pelvis. 2. When compared to most recent chest radiographs dated 02/11/2020, there are new, although age indeterminate wedge deformities of T6 and T7. Superior endplate deformity of T12 appears unchanged. In the setting of other known metastatic disease, this should be regarded with substantial suspicion for pathologic fractures with underlying metastatic lesions. MRI may be helpful to further assess. 3. Small fissural nodules of the posterior left upper lobe measuring 0.4 cm, most likely benign incidental intrapulmonary lymph nodes. Metastatic disease strongly not favored. 4. Emphysema and diffuse bilateral bronchial wall thickening. Background of fine centrilobular pulmonary nodules, consistent with smoking-related respiratory bronchiolitis. 5. Small fissural nodules of the posterior left upper lobe measuring 0.4 cm, most likely benign incidental  intrapulmonary lymph nodes. 6. Coronary artery disease. 7. Sigmoid diverticulosis without evidence of acute diverticulitis. Aortic Atherosclerosis (ICD10-I70.0) and Emphysema (ICD10-J43.9). Electronically Signed   By: Jearld Lesch M.D.   On: 10/06/2022 19:40     Scheduled Meds:  dexamethasone (DECADRON) injection  4 mg Intravenous Q6H   heparin  5,000 Units Subcutaneous Q8H   hydrALAZINE       metoprolol tartrate  12.5 mg Oral BID   pantoprazole (PROTONIX) IV  40 mg Intravenous Q24H   phenylephrine  1 suppository Rectal BID   Continuous Infusions:  sodium chloride 50 mL/hr at 10/08/22 1648   levETIRAcetam 500 mg (10/08/22 1721)     LOS: 3 days   Time spent: 67  Rhetta Mura, MD [facility] To contact the attending provider between 7A-7P or the covering provider during after hours 7P-7A, please log into the web site [email or web address] and access using universal [place] password for that web site. If you do not have the password, please call the hospital operator.  10/08/2022, 5:51 PM

## 2022-10-08 NOTE — Anesthesia Preprocedure Evaluation (Addendum)
Anesthesia Evaluation  Patient identified by MRN, date of birth, ID band Patient awake    Reviewed: Allergy & Precautions, NPO status , Patient's Chart, lab work & pertinent test results  Airway Mallampati: II  TM Distance: >3 FB Neck ROM: Full    Dental  (+) Teeth Intact, Dental Advisory Given, Implants,    Pulmonary COPD,  COPD inhaler, Current Smoker   Pulmonary exam normal breath sounds clear to auscultation       Cardiovascular hypertension, Pt. on medications +CHF  Normal cardiovascular exam Rhythm:Regular Rate:Normal  Echo 02/10/20: 1. Left ventricular ejection fraction, by estimation, is 30 to 35%. The  left ventricle has moderately decreased function. The left ventricle  demonstrates global hypokinesis. There is severe left ventricular  hypertrophy. Left ventricular diastolic  parameters are indeterminate.   2. Right ventricular systolic function is normal. The right ventricular  size is normal.   3. Left atrial size was moderately dilated.   4. The mitral valve is normal in structure. Mild mitral valve  regurgitation. No evidence of mitral stenosis.   5. The aortic valve is tricuspid. Aortic valve regurgitation is not  visualized. No aortic stenosis is present.   6. The inferior vena cava is normal in size with greater than 50%  respiratory variability, suggesting right atrial pressure of 3 mmHg.     Neuro/Psych Seizures -,  Brain mass     GI/Hepatic negative GI ROS, Neg liver ROS,,,  Endo/Other  Hypothyroidism    Renal/GU negative Renal ROS     Musculoskeletal  (+) Arthritis ,    Abdominal   Peds  Hematology negative hematology ROS (+)   Anesthesia Other Findings   Reproductive/Obstetrics                             Anesthesia Physical Anesthesia Plan  ASA: 4  Anesthesia Plan: General   Post-op Pain Management:    Induction: Intravenous  PONV Risk Score and  Plan: 2 and Ondansetron  Airway Management Planned: Oral ETT  Additional Equipment:   Intra-op Plan:   Post-operative Plan: Extubation in OR  Informed Consent: I have reviewed the patients History and Physical, chart, labs and discussed the procedure including the risks, benefits and alternatives for the proposed anesthesia with the patient or authorized representative who has indicated his/her understanding and acceptance.     Dental advisory given  Plan Discussed with: CRNA  Anesthesia Plan Comments:         Anesthesia Quick Evaluation

## 2022-10-08 NOTE — Anesthesia Procedure Notes (Signed)
Procedure Name: Intubation Date/Time: 10/08/2022 1:51 PM  Performed by: Dairl Ponder, CRNAPre-anesthesia Checklist: Patient identified, Emergency Drugs available, Suction available and Patient being monitored Patient Re-evaluated:Patient Re-evaluated prior to induction Oxygen Delivery Method: Circle System Utilized Preoxygenation: Pre-oxygenation with 100% oxygen Induction Type: IV induction Ventilation: Mask ventilation without difficulty Laryngoscope Size: Glidescope and 3 Grade View: Grade I Tube type: Oral Tube size: 7.0 mm Number of attempts: 1 Airway Equipment and Method: Stylet and Oral airway Placement Confirmation: ETT inserted through vocal cords under direct vision, positive ETCO2 and breath sounds checked- equal and bilateral Secured at: 22 cm Tube secured with: Tape Dental Injury: Teeth and Oropharynx as per pre-operative assessment

## 2022-10-08 NOTE — Procedures (Signed)
Patient Name: Amaryah Mallen  MRN: [id number]  Epilepsy Attending: Charlsie Quest  Referring Physician/Provider: Gordy Councilman, MD  Duration: 10/07/2022 1315  to 10/08/2022 1830   Patient history: 72 yo woman with hx COPD, HTN, HL, tobacco abuse who presents with aphasia and R sided weakness favored to be 2/2 seizure 2/2 dural based mass at the parasagittal left parietal convexity with associated vasogenic edema and localized left-to-right shift. EEG to evaluate for seizure   Level of alertness: Awake, asleep   AEDs during EEG study: LEV   Technical aspects: This EEG study was done with scalp electrodes positioned according to the 10-20 International system of electrode placement. Electrical activity was reviewed with band pass filter of 1-70Hz , sensitivity of 7 uV/mm, display speed of 31mm/sec with a 60Hz  notched filter applied as appropriate. EEG data were recorded continuously and digitally stored.  Video monitoring was available and reviewed as appropriate.   Description: The posterior dominant rhythm consists of 9 Hz activity of moderate voltage (25-35 uV) seen predominantly in posterior head regions, symmetric and reactive to eye opening and eye closing. Sleep was characterized by vertex waves, sleep spindles (12 to 14 Hz), maximal frontocentral region. Hyperventilation and photic stimulation were not performed.     Parts of study were technically difficult due to significant electrode artifact. EEG was disconnected between 10/08/2022 at 1202 to 1657 for MRI   IMPRESSION: This technically difficult study is within normal limits. No seizures or epileptiform discharges were seen throughout the recording.    A normal interictal EEG does not exclude the diagnosis of epilepsy.   Deauna Yaw Annabelle Harman

## 2022-10-08 NOTE — Progress Notes (Signed)
[facility] called and states pt was not hooked back to LTM EEG would. Tried to walk RN through process and she states she will try and hook it back up.  Will try back to hook up to LTM as schedule permits.

## 2022-10-08 NOTE — Progress Notes (Signed)
   10/08/22 1800  Spiritual Encounters  Type of Visit Follow up  Care provided to: Pt and family  Conversation partners present during encounter Nurse  Referral source Family;Chaplain assessment  Reason for visit Routine spiritual support  OnCall Visit No  Spiritual Framework  Presenting Themes Goals in life/care;Values and beliefs;Significant life change;Impactful experiences and emotions;Courage hope and growth;Rituals and practive  Community/Connection Family;Friend(s)  Patient Stress Factors Loss of control  Family Stress Factors Loss of control;Lack of caregivers;Lack of knowledge   Chapalin wa paged from RN who stated that hpatient's husbasnd was in room and emotional and family was coming to visit.  Arrived and patient waas alert and sitting u0p nd talking and engaging well.  Oldest Daughter wa emotional and wept.  They spoke of their love for their mother and addressed their emotions.  Was able to pray at their request for the patient and family participated.  Ended visit with a departing blessing.

## 2022-10-08 NOTE — Progress Notes (Signed)
EEG maint complete.  ?

## 2022-10-08 NOTE — Anesthesia Postprocedure Evaluation (Signed)
Anesthesia Post Note  Patient: Penny Hicks  Procedure(s) Performed: MRI WITH ANESTHESIA     Patient location during evaluation: PACU Anesthesia Type: General Level of consciousness: awake and alert Pain management: pain level controlled Vital Signs Assessment: post-procedure vital signs reviewed and stable Respiratory status: spontaneous breathing, nonlabored ventilation, respiratory function stable and patient connected to nasal cannula oxygen Cardiovascular status: blood pressure returned to baseline and stable Postop Assessment: no apparent nausea or vomiting Anesthetic complications: no   No notable events documented.  Last Vitals:  Vitals:   10/08/22 1621 10/08/22 2000  BP: 120/72 135/86  Pulse: 100 94  Resp: 16 17  Temp: 36.9 C 36.6 C  SpO2: 94% 93%    Last Pain:  Vitals:   10/08/22 2000  TempSrc: Oral  PainSc:                  Collene Schlichter

## 2022-10-08 NOTE — Transfer of Care (Signed)
Immediate Anesthesia Transfer of Care Note  Patient: Penny Hicks  Procedure(s) Performed: MRI WITH ANESTHESIA  Patient Location: PACU  Anesthesia Type:General  Level of Consciousness: awake, alert , and oriented  Airway & Oxygen Therapy: Patient Spontanous Breathing and Patient connected to nasal cannula oxygen  Post-op Assessment: Report given to RN and Post -op Vital signs reviewed and stable  Post vital signs: Reviewed and stable  Last Vitals:  Vitals Value Taken Time  BP 162/101 10/08/22 1525  Temp    Pulse 92 10/08/22 1525  Resp 19 10/08/22 1525  SpO2 88 % 10/08/22 1525  Vitals shown include unvalidated device data.  Last Pain:  Vitals:   10/08/22 1145  TempSrc: Oral  PainSc: 0-No pain         Complications: No notable events documented.

## 2022-10-08 NOTE — Progress Notes (Addendum)
Patient returned from MRI. VSS. Patient denied any distress. RN noted new skin tear on left forearm. EEG notified.

## 2022-10-08 NOTE — Progress Notes (Signed)
MRI called and requested that EEG come and take pt off for MRI. Box unplugged by [employer] but not leads. Pt had MRI compatible leads placed.

## 2022-10-08 NOTE — Progress Notes (Signed)
Patient is now leaving unit for MRI. Patient removed 3 rings and gave it to her son. EEG unplugged at this time. Report given to Darl Pikes, RN Preop.

## 2022-10-09 ENCOUNTER — Inpatient Hospital Stay (HOSPITAL_COMMUNITY): Payer: 59

## 2022-10-09 DIAGNOSIS — Z01810 Encounter for preprocedural cardiovascular examination: Secondary | ICD-10-CM | POA: Diagnosis not present

## 2022-10-09 DIAGNOSIS — I429 Cardiomyopathy, unspecified: Secondary | ICD-10-CM | POA: Diagnosis not present

## 2022-10-09 DIAGNOSIS — R569 Unspecified convulsions: Secondary | ICD-10-CM | POA: Diagnosis not present

## 2022-10-09 LAB — ECHOCARDIOGRAM COMPLETE
AR max vel: 3.01 cm2
AV Area VTI: 3.05 cm2
AV Area mean vel: 2.87 cm2
AV Mean grad: 4.5 mmHg
AV Peak grad: 7.7 mmHg
Ao pk vel: 1.39 m/s
Area-P 1/2: 2.16 cm2
Calc EF: 37.5 %
Height: 64.016 in
MV VTI: 2.91 cm2
S' Lateral: 4 cm
Single Plane A2C EF: 36.5 %
Single Plane A4C EF: 38.2 %
Weight: 2292.78 oz

## 2022-10-09 LAB — COMPREHENSIVE METABOLIC PANEL
ALT: 28 U/L (ref 0–44)
AST: 35 U/L (ref 15–41)
Albumin: 3.3 g/dL — ABNORMAL LOW (ref 3.5–5.0)
Alkaline Phosphatase: 53 U/L (ref 38–126)
Anion gap: 6 (ref 5–15)
BUN: 19 mg/dL (ref 8–23)
CO2: 26 mmol/L (ref 22–32)
Calcium: 8.7 mg/dL — ABNORMAL LOW (ref 8.9–10.3)
Chloride: 105 mmol/L (ref 98–111)
Creatinine, Ser: 1.02 mg/dL — ABNORMAL HIGH (ref 0.44–1.00)
GFR, Estimated: 58 mL/min — ABNORMAL LOW (ref >60)
Glucose, Bld: 136 mg/dL — ABNORMAL HIGH (ref 70–99)
Potassium: 3.6 mmol/L (ref 3.5–5.1)
Sodium: 137 mmol/L (ref 135–145)
Total Bilirubin: 0.6 mg/dL (ref 0.3–1.2)
Total Protein: 5.9 g/dL — ABNORMAL LOW (ref 6.5–8.1)

## 2022-10-09 LAB — CBC
HCT: 41 % (ref 36.0–46.0)
Hemoglobin: 13.3 g/dL (ref 12.0–15.0)
MCH: 30 pg (ref 26.0–34.0)
MCHC: 32.4 g/dL (ref 30.0–36.0)
MCV: 92.6 fL (ref 80.0–100.0)
Platelets: 293 10*3/uL (ref 150–400)
RBC: 4.43 MIL/uL (ref 3.87–5.11)
RDW: 12.5 % (ref 11.5–15.5)
WBC: 10.1 10*3/uL (ref 4.0–10.5)
nRBC: 0 % (ref 0.0–0.2)

## 2022-10-09 MED ORDER — METOPROLOL TARTRATE 25 MG PO TABS: 37.5000 mg | ORAL_TABLET | Freq: Three times a day (TID) | ORAL | Status: DC

## 2022-10-09 MED ORDER — MUPIROCIN 2 % EX OINT
1.0000 | TOPICAL_OINTMENT | Freq: Two times a day (BID) | CUTANEOUS | Status: DC
  Administered 2022-10-10 – 2022-10-13 (×6): 1 via NASAL
  Filled 2022-10-09 (×2): qty 22

## 2022-10-09 MED ORDER — CEFAZOLIN SODIUM-DEXTROSE 2-4 GM/100ML-% IV SOLN
2.0000 g | INTRAVENOUS | Status: AC
  Administered 2022-10-10: 2 g via INTRAVENOUS
  Filled 2022-10-09: qty 100

## 2022-10-09 MED ORDER — METOPROLOL TARTRATE 25 MG PO TABS
25.0000 mg | ORAL_TABLET | Freq: Two times a day (BID) | ORAL | Status: DC
  Administered 2022-10-09 – 2022-10-12 (×6): 25 mg via ORAL
  Filled 2022-10-09 (×6): qty 1

## 2022-10-09 MED ORDER — HYDRALAZINE HCL 20 MG/ML IJ SOLN
5.0000 mg | Freq: Four times a day (QID) | INTRAMUSCULAR | Status: DC | PRN
  Administered 2022-10-09: 5 mg via INTRAVENOUS
  Filled 2022-10-09: qty 1

## 2022-10-09 MED ORDER — METOPROLOL TARTRATE 25 MG PO TABS: 25.0000 mg | ORAL_TABLET | Freq: Three times a day (TID) | ORAL | Status: DC

## 2022-10-09 NOTE — Progress Notes (Signed)
   Providing Compassionate, Quality Care - Together  NEUROSURGERY PROGRESS NOTE   S: No issues overnight.   O: EXAM:  BP (!) 180/106 (BP Location: Right Arm) Comment: Pt. just finished using  the BSC, will recheck it later  Pulse 76   Temp 97.8 F (36.6 C) (Oral)   Resp 17   Ht 5' 4.02" (1.626 m)   Wt 65 kg   SpO2 93%   BMI 24.58 kg/m   Awake, alert, oriented x3 PERRL Speech fluent, appropriate  CNs grossly intact  5/5 BUE/BLE  No drift EEG in place  ASSESSMENT:  72 y.o. 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  I discussed with the family and the patient at bedside and recommend surgical intervention, she is pending an echo.  If her echo is reasonable, we will proceed with surgical intervention tomorrow. -I discussed all risks, benefits and expected outcomes as well as alternatives to treatment and they would like to proceed with surgical planning tomorrow. -Continue Keppra -N.p.o. at midnight    Thank you for allowing me to participate in this patient's care.  Please do not hesitate to call with questions or concerns.   Monia Pouch, DO Neurosurgeon [facility] Cell: [phone]

## 2022-10-09 NOTE — Progress Notes (Signed)
PROGRESS NOTE   Kindel Rochefort  ZOX:[id number] DOB: [birth year] DOA: 10/05/2022 PCP: Assunta Found, MD   Brief Narrative:   72 year old white female home dwelling HTN HLD arthritis hypothyroid COPD HFrEF prior echo 30-35% Tendency to hyponatremia-?  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 Jake Samples involved in care as is epileptologist Dr. Melynda Ripple MRI brain 4/28 3 x 4 x 3 left parafalcine mass Echocardiogram EF 30% to 35%  Going for surgery probably 4/30  Hospital-Problem based course  Possible meningioma brain causing mass effect, confusion which is still somewhat persistent Continues Decadron 4 every 6 IV, Keppra 500 IV every 12, saline 50 cc/H Seizure, has as needed Ativan i IV Long-term EEG overnight 4/27 is unrevealing for seizures although was a technically difficult study  METS~ 3 Acs-nsqip CALCULATED=6.8% WHERE 8.8% IS THE AVERAGE patient is Below Average risk for the procedure  Leukocytosis is expected in the setting of steroids she is afebrile  HFrEF last EF 30-35% Echo confirms EF 35-40% with no real change from 2021 and patient seems as best optimized and does not have any edema KVO IV overnight as +3.7lL Can resume in a.m.  HTN Hold lisinopril 20, start metoprolol 12.5 twice daily because of uncontrolled pressure  COPD, Gold stage II As needed albuterol reordered-monitor  Transaminitis is probably just a variant-monitor   DVT prophylaxis: Heparin Code Status:  Full Family Communication: Discussed with father members at the bedside including husband Disposition:  Status is: Inpatient Remains inpatient appropriate because:   meningioma surgery a.m. 4/30    Subjective:  More coherent alert seems pleasant no distress   Objective: Vitals:   10/08/22 2000 10/08/22 2335 10/09/22 0339 10/09/22 0934  BP: 135/86 (!) 141/79 (!) 180/106 (!) 181/98  Pulse: 94 77 76 87  Resp: 17 16 17 16   Temp: 97.9 F (36.6 C) 97.7 F (36.5 C) 97.8 F (36.6 C) 98.2 F (36.8 C)  TempSrc: Oral Oral Oral Oral  SpO2: 93% 94% 93% 95%  Weight:      Height:        Intake/Output Summary (Last 24 hours) at 10/09/2022 1537 Last data filed at 10/09/2022 0600 Gross per 24 hour  Intake 1120 ml  Output --  Net 1120 ml    Filed Weights   10/05/22 1759 10/08/22 1222  Weight: 65 kg 65 kg    Examination:  EOMI NCAT looking older than stated age, no icterus no pallor Neck soft supple Tracks well with eyes no nystagmus Power is 5/5 bilaterally upper extremities--slight past-pointing on the left side no tremor Straight leg raise bilaterally is equal with no deficit Plantar dorsiflexion intact Reflexes not tested today  Data Reviewed: personally reviewed   CBC    Component Value Date/Time   WBC 10.1 10/09/2022 0332   RBC 4.43 10/09/2022 0332   HGB 13.3 10/09/2022 0332   HCT 41.0 10/09/2022 0332   PLT 293 10/09/2022 0332   MCV 92.6 10/09/2022 0332   MCH 30.0 10/09/2022 0332   MCHC 32.4 10/09/2022 0332   RDW 12.5 10/09/2022 0332   LYMPHSABS 1.4 10/05/2022 1732  MONOABS 0.3 10/05/2022 1732   EOSABS 0.0 10/05/2022 1732   BASOSABS 0.0 10/05/2022 1732      Latest Ref Rng & Units 10/09/2022    3:32 AM 10/08/2022    3:07 AM 10/07/2022    3:36 AM  CMP  Glucose 70 - 99 mg/dL 161  096  045   BUN 8 - 23 mg/dL 19  19  17    Creatinine 0.44 - 1.00 mg/dL 4.09  8.11  9.14   Sodium 135 - 145 mmol/L 137  138  139   Potassium 3.5 - 5.1 mmol/L 3.6  3.8  3.7    Chloride 98 - 111 mmol/L 105  105  108   CO2 22 - 32 mmol/L 26  28  25    Calcium 8.9 - 10.3 mg/dL 8.7  8.6  9.0   Total Protein 6.5 - 8.1 g/dL 5.9  5.7  6.1   Total Bilirubin 0.3 - 1.2 mg/dL 0.6  0.7  0.7   Alkaline Phos 38 - 126 U/L 53  48  53   AST 15 - 41 U/L 35  45  38   ALT 0 - 44 U/L 28  26  21       Radiology Studies: ECHOCARDIOGRAM COMPLETE  Result Date: 10/09/2022    ECHOCARDIOGRAM REPORT   Patient Name:   MILLIE FORDE Date of Exam: 10/09/2022 Medical Rec #:  [id number]         Height:       64.0 in Accession #:    [id number]        Weight:       143.3 lb Date of Birth:  1951/03/02          BSA:          1.698 m Patient Age:    72 years          BP:           181/98 mmHg Patient Gender: F                 HR:           73 bpm. Exam Location:  Inpatient Procedure: 2D Echo, Cardiac Doppler and Color Doppler Indications:    Preop cardiovascular exam  History:        Patient has prior history of Echocardiogram examinations, most                 recent 02/10/2020. CHF; Risk Factors:Hypertension and                 Dyslipidemia.  Sonographer:    Wallie Char Referring Phys: [phone] [facility] Romelle Reiley IMPRESSIONS  1. Global hypokinesis with akinesis of the basal inferolateral wall.  2. Left ventricular ejection fraction, by estimation, is 30 to 35%. The left ventricle has moderately decreased function. The left ventricle demonstrates regional wall motion abnormalities (see scoring diagram/findings for description). There is moderate left ventricular hypertrophy. Left ventricular diastolic parameters are consistent with Grade I diastolic dysfunction (impaired relaxation).  3. Right ventricular systolic function is mildly reduced. The right ventricular size is normal.  4. Left atrial size was mildly dilated.  5. The mitral valve is normal in structure. Trivial mitral valve regurgitation. No evidence of mitral stenosis.  6. The aortic valve is tricuspid. Aortic valve regurgitation is not visualized. No  aortic stenosis is present.  7. The inferior vena cava is dilated in size with >50% respiratory variability, suggesting right atrial pressure of 8 mmHg. FINDINGS  Left  Ventricle: Left ventricular ejection fraction, by estimation, is 30 to 35%. The left ventricle has moderately decreased function. The left ventricle demonstrates regional wall motion abnormalities. The left ventricular internal cavity size was normal in size. There is moderate left ventricular hypertrophy. Left ventricular diastolic parameters are consistent with Grade I diastolic dysfunction (impaired relaxation). Right Ventricle: The right ventricular size is normal. Right ventricular systolic function is mildly reduced. Left [facility]: Left atrial size was mildly dilated. Right [facility]: Right atrial size was normal in size. Pericardium: There is no evidence of pericardial effusion. Mitral Valve: The mitral valve is normal in structure. Trivial mitral valve regurgitation. No evidence of mitral valve stenosis. MV peak gradient, 4.6 mmHg. The mean mitral valve gradient is 2.0 mmHg. Tricuspid Valve: The tricuspid valve is normal in structure. Tricuspid valve regurgitation is trivial. No evidence of tricuspid stenosis. Aortic Valve: The aortic valve is tricuspid. Aortic valve regurgitation is not visualized. No aortic stenosis is present. Aortic valve mean gradient measures 4.5 mmHg. Aortic valve peak gradient measures 7.7 mmHg. Aortic valve area, by VTI measures 3.05 cm. Pulmonic Valve: The pulmonic valve was normal in structure. Pulmonic valve regurgitation is trivial. No evidence of pulmonic stenosis. Aorta: The aortic root is normal in size and structure. Venous: The inferior vena cava is dilated in size with greater than 50% respiratory variability, suggesting right atrial pressure of 8 mmHg. IAS/Shunts: No atrial level shunt detected by color flow Doppler. Additional Comments: Global hypokinesis with akinesis of the basal inferolateral wall.  LEFT  VENTRICLE PLAX 2D LVIDd:         4.90 cm      Diastology LVIDs:         4.00 cm      LV e' medial:    4.10 cm/s LV PW:         1.30 cm      LV E/e' medial:  13.6 LV IVS:        1.20 cm      LV e' lateral:   7.81 cm/s LVOT diam:     2.30 cm      LV E/e' lateral: 7.1 LV SV:         83 LV SV Index:   49 LVOT Area:     4.15 cm  LV Volumes (MOD) LV vol d, MOD A2C: 114.0 ml LV vol d, MOD A4C: 119.0 ml LV vol s, MOD A2C: 72.4 ml LV vol s, MOD A4C: 73.5 ml LV SV MOD A2C:     41.6 ml LV SV MOD A4C:     119.0 ml LV SV MOD BP:      43.9 ml RIGHT VENTRICLE             IVC RV Basal diam:  3.60 cm     IVC diam: 2.40 cm RV S prime:     15.60 cm/s TAPSE (M-mode): 1.8 cm LEFT ATRIUM             Index        RIGHT ATRIUM           Index LA diam:        4.40 cm 2.59 cm/m   RA Area:     13.30 cm LA Vol (A2C):   59.4 ml 34.98 ml/m  RA Volume:   30.30 ml  17.85 ml/m LA Vol (A4C):   60.3 ml 35.51 ml/m LA Biplane Vol: 63.9 ml 37.63 ml/m  AORTIC VALVE AV Area (Vmax):    3.01  cm AV Area (Vmean):   2.87 cm AV Area (VTI):     3.05 cm AV Vmax:           138.50 cm/s AV Vmean:          99.050 cm/s AV VTI:            0.272 m AV Peak Grad:      7.7 mmHg AV Mean Grad:      4.5 mmHg LVOT Vmax:         100.40 cm/s LVOT Vmean:        68.350 cm/s LVOT VTI:          0.200 m LVOT/AV VTI ratio: 0.73  AORTA Ao Root diam: 3.60 cm Ao Asc diam:  3.70 cm MITRAL VALVE                TRICUSPID VALVE MV Area (PHT): 2.16 cm     TR Peak grad:   27.5 mmHg MV Area VTI:   2.91 cm     TR Vmax:        262.00 cm/s MV Peak grad:  4.6 mmHg MV Mean grad:  2.0 mmHg     SHUNTS MV Vmax:       1.07 m/s     Systemic VTI:  0.20 m MV Vmean:      56.1 cm/s    Systemic Diam: 2.30 cm MV Decel Time: 352 msec MV E velocity: 55.60 cm/s MV A velocity: 116.00 cm/s MV E/A ratio:  0.48 Olga Millers MD Electronically signed by Olga Millers MD Signature Date/Time: 10/09/2022/12:59:49 PM    Final    MR BRAIN W WO CONTRAST  Result Date: 10/08/2022 CLINICAL DATA:  Initial  evaluation for brain/CNS neoplasm. Stereotactic protocol for surgical guidance. EXAM: MRI HEAD WITHOUT AND WITH CONTRAST TECHNIQUE: Multiplanar, multiecho pulse sequences of the brain and surrounding structures were obtained without and with intravenous contrast. CONTRAST:  6.17mL GADAVIST GADOBUTROL 1 MMOL/ML IV SOLN COMPARISON:  Prior studies from 10/06/22 and earlier. FINDINGS: Brain: Cerebral volume within normal limits. Patchy T2/FLAIR hyperintensity involving the periventricular deep white matter both cerebral hemispheres, consistent with chronic small vessel ischemic disease, moderately advanced in nature. Previous is identified round well-circumscribed extra-axial mass overlying the left parafalcine left parietal convexity. Lesion measures 3.6 x 3.7 x 4.1 cm (AP by transverse by craniocaudad). Associated T2/FLAIR signal abnormality within the adjacent posterior left cerebral hemisphere consistent with vasogenic edema. Localized left-to-right shift at the falx again noted. Small nodular focus of this lesion appears to cross the midline (series 6, image 110). On this postcontrast examination, this lesion is seen to abut but not invade the adjacent superior sagittal sinus. No other mass or abnormal enhancement. Examination to be utilized for surgical guidance purposes. Vascular: Normal intravascular enhancement seen throughout the intracranial circulation. Skull and upper cervical spine: Craniocervical junction within normal limits. Bone marrow signal intensity normal. No scalp soft tissue abnormality. Sinuses/Orbits: Globes orbital soft tissues within normal limits. Paranasal sinuses are largely clear. No significant mastoid effusion. Other: None. IMPRESSION: 1. 3.6 x 3.7 x 4.1 cm extra-axial mass overlying the parafalcine left parietal convexity, most consistent with a meningioma. Associated vasogenic edema with regional mass effect and localized left-to-right shift as above. Lesion abuts but does not invade  the adjacent superior sagittal sinus. Examination to be utilized for surgical guidance purposes. 2. Underlying moderately advanced chronic microvascular ischemic disease. Electronically Signed   By: Rise Mu M.D.   On: 10/08/2022 20:56  Scheduled Meds:  dexamethasone (DECADRON) injection  4 mg Intravenous Q6H   heparin  5,000 Units Subcutaneous Q8H   metoprolol tartrate  12.5 mg Oral BID   pantoprazole (PROTONIX) IV  40 mg Intravenous Q24H   phenylephrine  1 suppository Rectal BID   Continuous Infusions:  sodium chloride 50 mL/hr at 10/09/22 1023   levETIRAcetam 500 mg (10/09/22 0552)     LOS: 4 days   Time spent: 67  Rhetta Mura, MD [facility] To contact the attending provider between 7A-7P or the covering provider during after hours 7P-7A, please log into the web site [email or web address] and access using universal [place] password for that web site. If you do not have the password, please call the hospital operator.  10/09/2022, 3:37 PM

## 2022-10-09 NOTE — Procedures (Addendum)
Patient Name: Penny Hicks  MRN: [id number]  Epilepsy Attending: Charlsie Quest  Referring Physician/Provider: Gordy Councilman, MD  Duration: 10/08/2022 1315  to 10/09/2022 1018   Patient history: 72 yo woman with hx COPD, HTN, HL, tobacco abuse who presents with aphasia and R sided weakness favored to be 2/2 seizure 2/2 dural based mass at the parasagittal left parietal convexity with associated vasogenic edema and localized left-to-right shift. EEG to evaluate for seizure   Level of alertness: Awake, asleep   AEDs during EEG study: LEV   Technical aspects: This EEG study was done with scalp electrodes positioned according to the 10-20 International system of electrode placement. Electrical activity was reviewed with band pass filter of 1-70Hz , sensitivity of 7 uV/mm, display speed of 65mm/sec with a 60Hz  notched filter applied as appropriate. EEG data were recorded continuously and digitally stored.  Video monitoring was available and reviewed as appropriate.   Description: The posterior dominant rhythm consists of 9 Hz activity of moderate voltage (25-35 uV) seen predominantly in posterior head regions, symmetric and reactive to eye opening and eye closing. Sleep was characterized by vertex waves, sleep spindles (12 to 14 Hz), maximal frontocentral region. Hyperventilation and photic stimulation were not performed.      Parts of study were technically difficult due to significant electrode artifact.       IMPRESSION: This technically difficult study is within normal limits. No seizures or epileptiform discharges were seen throughout the recording.    A normal interictal EEG does not exclude the diagnosis of epilepsy.   Eleshia Wooley Annabelle Harman

## 2022-10-09 NOTE — TOC CM/SW Note (Signed)
  Transition of Care [facility]) Screening Note   Patient Details  Name: Penny Hicks Date of Birth: 1950-12-26   Possible surgery tomorrow pending echo results . Consult for SNF,Home health needs , will need post op PT/OT evaluations.    Transition of Care Department [facility])  will continue to monitor patient advancement through interdisciplinary progression rounds. If new patient transition needs arise, please place a TOC consult.

## 2022-10-09 NOTE — Progress Notes (Addendum)
Subjective: No acute events overnight.  MRI brain with and without contrast obtained under anesthesia suggestive of meningioma.  ROS: negative except above  Examination  Vital signs in last 24 hours: Temp:  [97.4 F (36.3 C)-98.5 F (36.9 C)] 98.2 F (36.8 C) (04/29 0934) Pulse Rate:  [62-107] 87 (04/29 0934) Resp:  [16-29] 16 (04/29 0934) BP: (120-192)/(72-131) 181/98 (04/29 0934) SpO2:  [86 %-100 %] 95 % (04/29 0934) Weight:  [65 kg] 65 kg (04/28 1222)  General: lying in bed, NAD Neuro: MS: Alert, oriented, follows commands CN: pupils equal and reactive,  EOMI, face symmetric, tongue midline, normal sensation over face, Motor: 5/5 strength in all 4 extremities Reflexes: 2+ bilaterally over patella, biceps, plantars: flexor Coordination: normal Gait: not tested  Basic Metabolic Panel: Recent Labs  Lab 10/05/22 1732 10/06/22 0649 10/07/22 0336 10/08/22 0307 10/09/22 0332  NA 136  138 139 139 138 137  K 3.4*  3.7 3.6 3.7 3.8 3.6  CL 101  102 105 108 105 105  CO2 20* 24 25 28 26   GLUCOSE 159*  152* 154* 159* 136* 136*  BUN 18  17 10 17 19 19   CREATININE 1.16*  1.20* 0.94 0.96 0.95 1.02*  CALCIUM 9.3 8.7* 9.0 8.6* 8.7*  MG 1.9  --   --   --   --     CBC: Recent Labs  Lab 10/05/22 1732 10/06/22 0649 10/07/22 0336 10/08/22 0307 10/09/22 0332  WBC 8.3 9.1 16.1* 13.8* 10.1  NEUTROABS 6.4  --   --   --   --   HGB 14.6  15.0 14.3 14.1 13.0 13.3  HCT 44.3  44.0 43.8 43.1 39.6 41.0  MCV 92.3 92.0 91.9 92.5 92.6  PLT 313 317 324 293 293     Coagulation Studies: No results for input(s): "LABPROT", "INR" in the last 72 hours.  Imaging  CT chest Abdo pelvis with contrast 10/06/2022: 1. No specific evidence of metastatic disease in the chest, abdomen, or pelvis. 2. When compared to most recent chest radiographs dated 02/11/2020,there are new, although age indeterminate wedge deformities of T6 and T7. Superior endplate deformity of T12 appears unchanged. In  the setting of other known metastatic disease, this should be regarded with substantial suspicion for pathologic fractures with underlying metastatic lesions. MRI may be helpful to further assess. 3. Small fissural nodules of the posterior left upper lobe measuring 0.4 cm, most likely benign incidental intrapulmonary lymph nodes.Metastatic disease strongly not favored. 4. Emphysema and diffuse bilateral bronchial wall thickening. Background of fine centrilobular pulmonary nodules, consistent with smoking-related respiratory bronchiolitis. 5. Small fissural nodules of the posterior left upper lobe measuring 0.4 cm, most likely benign incidental intrapulmonary lymph nodes. 6. Coronary artery disease. 7. Sigmoid diverticulosis without evidence of acute diverticulitis.  Aortic Atherosclerosis (ICD10-I70.0) and Emphysema (ICD10-J43.9).   MRI brain with and without contrast 10/08/2022: 1. 3.6 x 3.7 x 4.1 cm extra-axial mass overlying the parafalcine left parietal convexity, most consistent with a meningioma. Associated vasogenic edema with regional mass effect and localized left-to-right shift as above. Lesion abuts but does not invade the adjacent superior sagittal sinus. Examination to be utilized for surgical guidance purposes. 2. Underlying moderately advanced chronic microvascular ischemic disease.     ASSESSMENT AND PLAN: 72 yo woman with hx COPD, HTN, HL, tobacco abuse who presents with aphasia and R sided weakness favored to be 2/2 seizure 2/2 dural based mass at the parasagittal left parietal convexity with associated vasogenic edema and localized left-to-right shift.  Left parietal meningioma Cerebral edema Seizure -DC LTM EEG as no further seizures - Continue Keppra 500mg  BID.   Continue IV Keppra for tomorrow morning and evening as patient will be n.p.o.  This can be switched to p.o. after surgery if able to tolerate -On dexamethasone 4 mg every 6 hours for cerebral edema -Plan for  surgery tomorrow - Continue seizure precautions - PRN IV versed for clinical seizure - Management of rest of comorbidities per pri team -Recommend follow-up with neurology in 4 to 6 weeks after discharge  Seizure precautions: Per [facility] statutes, patients with seizures are not allowed to drive until they have been seizure-free for six months and cleared by a physician    Use caution when using heavy equipment or power tools. Avoid working on ladders or at heights. Take showers instead of baths. Ensure the water temperature is not too high on the home water heater. Do not go swimming alone. Do not lock yourself in a room alone (i.e. bathroom). When caring for infants or small children, sit down when holding, feeding, or changing them to minimize risk of injury to the child in the event you have a seizure. Maintain good sleep hygiene. Avoid alcohol.    If patient has another seizure, call 911 and bring them back to the ED if: A.  The seizure lasts longer than 5 minutes.      B.  The patient doesn't wake shortly after the seizure or has new problems such as difficulty seeing, speaking or moving following the seizure C.  The patient was injured during the seizure D.  The patient has a temperature over 102 F (39C) E.  The patient vomited during the seizure and now is having trouble breathing    During the Seizure   - First, ensure adequate ventilation and place patients on the floor on their left side  Loosen clothing around the neck and ensure the airway is patent. If the patient is clenching the teeth, do not force the mouth open with any object as this can cause severe damage - Remove all items from the surrounding that can be hazardous. The patient may be oblivious to what's happening and may not even know what he or she is doing. If the patient is confused and wandering, either gently guide him/her away and block access to outside areas - Reassure the individual and be  comforting - Call 911. In most cases, the seizure ends before EMS arrives. However, there are cases when seizures may last over 3 to 5 minutes. Or the individual may have developed breathing difficulties or severe injuries. If a pregnant patient or a person with diabetes develops a seizure, it is prudent to call an ambulance. - Finally, if the patient does not regain full consciousness, then call EMS. Most patients will remain confused for about 45 to 90 minutes after a seizure, so you must use judgment in calling for help.    After the Seizure (Postictal Stage)   After a seizure, most patients experience confusion, fatigue, muscle pain and/or a headache. Thus, one should permit the individual to sleep. For the next few days, reassurance is essential. Being calm and helping reorient the person is also of importance.   Most seizures are painless and end spontaneously. Seizures are not harmful to others but can lead to complications such as stress on the lungs, brain and the heart. Individuals with prior lung problems may develop labored breathing and respiratory distress.     I  have spent a total of  38 minutes with the patient reviewing hospital notes,  test results, labs and examining the patient as well as establishing an assessment and plan that was discussed personally with the patient.  > 50% of time was spent in direct patient care.    Lindie Spruce Epilepsy [facility] For questions after 5pm please refer to AMION to reach the Neurologist on call

## 2022-10-09 NOTE — Progress Notes (Signed)
vLTM discontinued  No skin breakdown noted at all skin sites  [facility] notified 

## 2022-10-09 NOTE — Consult Note (Signed)
CARDIOLOGY CONSULT NOTE  Patient ID: Penny Hicks MRN: [id number] DOB/AGE: [birth year] 73 y.o.  Admit date: 10/05/2022 Primary Physician  Assunta Found, MD Primary Cardiologist None Chief Complaint  Preop op, history of cardiomyopathy Requesting  Dr. Mahala Menghini  HPI:   The patient was admitted after an episode of confusion and dysarthria right-sided weakness.  She is found to have a mass possibly a meningioma in the parasagittal left parietal area with vasogenic edema and some left-to-right shift.  She is in for resection of this tomorrow.  She saw Dr. Jerrel Ivory on 2012 for cardiology apparently there was no history of cardiomyopathy.  In 2016 the ejection fraction was 40 to 45% with moderate left ventricular hypertrophy.    I was able to review records at that time and she had hospitalization for hip fracture I do not see a workup but there were plans for outpatient cardiology follow-up.  I do not see any record that this happened.  She was treated with beta-blockers and ACE inhibitors.  Ejection fraction in 2021 was 30 to 35%.  There was global hypokinesis.  She had severe left ventricular hypertrophy.   She was hospitalized at that time for sepsis.    Today's echo confirms continued ejection fraction of 35% with moderate left ventricular hypertrophy.  I did review these images.  The patient does not recall ever being told to follow-up but does remember that her ejection fraction was slightly reduced.  She says she stayed active.  She does her household chores including vacuuming. The patient denies any new symptoms such as chest discomfort, neck or arm discomfort. There has been no new shortness of breath, PND or orthopnea. There have been no reported palpitations, presyncope or syncope.    Past Medical History:  Diagnosis Date   Arthritis    COPD (chronic obstructive pulmonary disease) (HCC)    Hypercholesterolemia    Hypertension    Hypothyroidism     Past Surgical History:   Procedure Laterality Date   CESAREAN SECTION  x 2   HIP PINNING,CANNULATED Right 05/25/2015   Procedure: CANNULATED HIP PINNING;  Surgeon: Jodi Geralds, MD;  Location: [facility] OR;  Service: Orthopedics;  Laterality: Right;   RADIOLOGY WITH ANESTHESIA N/A 10/08/2022   Procedure: MRI WITH ANESTHESIA;  Surgeon: Radiologist, Medication, MD;  Location: [facility] OR;  Service: Radiology;  Laterality: N/A;   TONSILLECTOMY      No Known Allergies Medications Prior to Admission  Medication Sig Dispense Refill Last Dose   albuterol (VENTOLIN HFA) 108 (90 Base) MCG/ACT inhaler Inhale 2 puffs into the lungs every 4 (four) hours as needed for wheezing or shortness of breath. 18 g 3 unknown   cholecalciferol (VITAMIN D) 1000 UNITS tablet Take 1,000 Units by mouth daily.   10/05/2022   clotrimazole-betamethasone (LOTRISONE) cream Apply 1 Application topically 2 (two) times daily.   unknown   Doxylamine Succinate, Sleep, (SLEEP-AID PO) Take 2 tablets by mouth at bedtime.   Past Week   lisinopril (ZESTRIL) 20 MG tablet Take 1 tablet (20 mg total) by mouth daily. 30 tablet 5 10/05/2022   Melatonin 10 MG CAPS Take 1 capsule by mouth at bedtime.   10/04/2022   Nutritional Supplements (ESTROVEN PO) Take 1 tablet by mouth every evening.   10/04/2022   Potassium Gluconate 595 MG CAPS Take 1 capsule by mouth daily.   10/05/2022   traMADol (ULTRAM) 50 MG tablet Take 50 mg by mouth 4 (four) times daily as needed for moderate pain or severe pain.  10/03/2022   vitamin E 400 UNIT capsule Take 400 Units by mouth daily.   10/05/2022   Family History  Problem Relation Age of Onset   Heart disease Father    Heart disease Sister    Heart disease Brother    Breast cancer Paternal Aunt    Heart disease Son   Of note her son apparently died of sudden cardiac death.   Social History   Socioeconomic History   Marital status: Married    Spouse name: Not on file   Number of children: 3   Years of education: Not on file   Highest  education level: Not on file  Occupational History   Occupation: retired  Tobacco Use   Smoking status: Every Day    Packs/day: 1    Types: Cigarettes   Smokeless tobacco: Never  Vaping Use   Vaping Use: Not on file  Substance and Sexual Activity   Alcohol use: No   Drug use: No   Sexual activity: Not on file  Other Topics Concern   Not on file  Social History Narrative   Not on file   Social Determinants of Health   Financial Resource Strain: Not on file  Food Insecurity: No Food Insecurity (10/06/2022)   Hunger Vital Sign    Worried About Running Out of Food in the Last Year: Never true    Ran Out of Food in the Last Year: Never true  Transportation Needs: No Transportation Needs (10/06/2022)   PRAPARE - Administrator, Civil Service (Medical): No    Lack of Transportation (Non-Medical): No  Physical Activity: Not on file  Stress: Not on file  Social Connections: Not on file  Intimate Partner Violence: Not At Risk (10/06/2022)   Humiliation, Afraid, Rape, and Kick questionnaire    Fear of Current or Ex-Partner: No    Emotionally Abused: No    Physically Abused: No    Sexually Abused: No     ROS:    As stated in the HPI and negative for all other systems.  Physical Exam: Blood pressure (!) 181/98, pulse 87, temperature 98.2 F (36.8 C), temperature source Oral, resp. rate 16, height 5' 4.02" (1.626 m), weight 65 kg, SpO2 95 %.  GENERAL:  Well appearing NECK:  No jugular venous distention, waveform within normal limits, carotid upstroke brisk and symmetric, no bruits, no thyromegaly LUNGS:  Clear to auscultation bilaterally CHEST:  Unremarkable HEART:  PMI not displaced or sustained,S1 and S2 within normal limits, no S3, no S4, no clicks, no rubs, no murmurs ABD:  Flat, positive bowel sounds normal in frequency in pitch, no bruits, no rebound, no guarding, no midline pulsatile mass, no hepatomegaly, no splenomegaly EXT:  2 plus pulses throughout, no edema,  no cyanosis no clubbing   Labs: Lab Results  Component Value Date   BUN 19 10/09/2022   Lab Results  Component Value Date   CREATININE 1.02 (H) 10/09/2022   Lab Results  Component Value Date   NA 137 10/09/2022   K 3.6 10/09/2022   CL 105 10/09/2022   CO2 26 10/09/2022   No results found for: "TROPONINI" Lab Results  Component Value Date   WBC 10.1 10/09/2022   HGB 13.3 10/09/2022   HCT 41.0 10/09/2022   MCV 92.6 10/09/2022   PLT 293 10/09/2022   No results found for: "CHOL", "HDL", "LDLCALC", "LDLDIRECT", "TRIG", "CHOLHDL" Lab Results  Component Value Date   ALT 28 10/09/2022   AST 35  10/09/2022   ALKPHOS 53 10/09/2022   BILITOT 0.6 10/09/2022      Radiology:   CT:  1. No specific evidence of metastatic disease in the chest, abdomen, or pelvis. 2. When compared to most recent chest radiographs dated 02/11/2020, there are new, although age indeterminate wedge deformities of T6 and T7. Superior endplate deformity of T12 appears unchanged. In the setting of other known metastatic disease, this should be regarded with substantial suspicion for pathologic fractures with underlying metastatic lesions. MRI may be helpful to further assess. 3. Small fissural nodules of the posterior left upper lobe measuring 0.4 cm, most likely benign incidental intrapulmonary lymph nodes. Metastatic disease strongly not favored. 4. Emphysema and diffuse bilateral bronchial wall thickening. Background of fine centrilobular pulmonary nodules, consistent with smoking-related respiratory bronchiolitis. 5. Small fissural nodules of the posterior left upper lobe measuring 0.4 cm, most likely benign incidental intrapulmonary lymph nodes. 6. Coronary artery disease. 7. Sigmoid diverticulosis without evidence of acute diverticulitis.  EKG: Sinus tachycardia, rate 122, premature ventricular contractions, left ventricular perjury by voltage criteria  ASSESSMENT AND PLAN:   Cardiomyopathy:  I will start her on a low-dose beta-blocker and I will titrate spironolactone/Entresto/Farxiga as her blood pressure tolerates postop.  She did not seem to be particularly volume overloaded now.  Etiology of this is not clear and eventually I will do an ischemia workup though I do not think this is the etiology.  Interestingly her son died suddenly in his 30s of apparent dilated cardiomyopathy although I do not know the details.  I probably would send her for genetic testing in the future.  LVH: Said a history of hypertension and this could be an etiology.  She might need workup for infiltrative disease in the future with MRI.  Dyslipidemia: She does have some aortic atherosclerosis and coronary calcium.  I will order a lipid panel.  I think the goal should be an LDL at least less than 100.  Hypertension: This will be managed in the context of managing her cardiomyopathy.  Preop: The patient had a high functional level.  She has no high risk symptoms recently.  She is at least moderate risk for cardiovascular complications with the upcoming surgery but I do not think this is prohibitive given the fact that the surgery is necessary.  There is no preop testing or preoperative management that would reduce this risk.  According to ACC/AHA guidelines she should proceed to surgery.  We need to watch her volume very carefully.  She should be followed on telemetry.  We will be available to manage volume or arrhythmias and also titrate her meds.  Her family understands and agrees.  The patient understands and agrees.  : Signed: Rollene Rotunda 10/09/2022, 4:50 PM

## 2022-10-10 ENCOUNTER — Other Ambulatory Visit: Payer: Self-pay

## 2022-10-10 ENCOUNTER — Inpatient Hospital Stay (HOSPITAL_COMMUNITY): Payer: 59 | Admitting: Anesthesiology

## 2022-10-10 ENCOUNTER — Encounter (HOSPITAL_COMMUNITY): Payer: Self-pay | Admitting: Internal Medicine

## 2022-10-10 ENCOUNTER — Inpatient Hospital Stay (HOSPITAL_COMMUNITY)
Admission: EM | Disposition: A | Discharge status: Home / Self Care | Payer: Self-pay | Source: Home / Self Care | Attending: Family Medicine

## 2022-10-10 DIAGNOSIS — D649 Anemia, unspecified: Secondary | ICD-10-CM | POA: Diagnosis not present

## 2022-10-10 DIAGNOSIS — I509 Heart failure, unspecified: Secondary | ICD-10-CM

## 2022-10-10 DIAGNOSIS — F17210 Nicotine dependence, cigarettes, uncomplicated: Secondary | ICD-10-CM

## 2022-10-10 DIAGNOSIS — R569 Unspecified convulsions: Secondary | ICD-10-CM | POA: Diagnosis not present

## 2022-10-10 DIAGNOSIS — I110 Hypertensive heart disease with heart failure: Secondary | ICD-10-CM

## 2022-10-10 DIAGNOSIS — I429 Cardiomyopathy, unspecified: Secondary | ICD-10-CM | POA: Diagnosis not present

## 2022-10-10 HISTORY — PX: CRANIOTOMY: SHX93

## 2022-10-10 HISTORY — PX: APPLICATION OF CRANIAL NAVIGATION: SHX6578

## 2022-10-10 LAB — POCT I-STAT 7, (LYTES, BLD GAS, ICA,H+H)
Acid-Base Excess: 2 mmol/L (ref 0.0–2.0)
Bicarbonate: 26.1 mmol/L (ref 20.0–28.0)
Calcium, Ion: 1.19 mmol/L (ref 1.15–1.40)
HCT: 37 % (ref 36.0–46.0)
Hemoglobin: 12.6 g/dL (ref 12.0–15.0)
O2 Saturation: 100 %
Potassium: 3.2 mmol/L — ABNORMAL LOW (ref 3.5–5.1)
Sodium: 139 mmol/L (ref 135–145)
TCO2: 27 mmol/L (ref 22–32)
pCO2 arterial: 37.8 mmHg (ref 32–48)
pH, Arterial: 7.448 (ref 7.35–7.45)
pO2, Arterial: 242 mmHg — ABNORMAL HIGH (ref 83–108)

## 2022-10-10 LAB — SURGICAL PCR SCREEN
MRSA, PCR: NEGATIVE
Staphylococcus aureus: POSITIVE — AB

## 2022-10-10 LAB — TYPE AND SCREEN
ABO/RH(D): A POS
Antibody Screen: NEGATIVE

## 2022-10-10 SURGERY — CRANIOTOMY TUMOR EXCISION
Anesthesia: General | Laterality: Left

## 2022-10-10 MED ORDER — FENTANYL CITRATE (PF) 100 MCG/2ML IJ SOLN
INTRAMUSCULAR | Status: AC
  Administered 2022-10-10: 50 ug via INTRAVENOUS
  Filled 2022-10-10: qty 2

## 2022-10-10 MED ORDER — SODIUM CHLORIDE 0.9 % IR SOLN
Status: DC | PRN
  Administered 2022-10-10: 1000 mL

## 2022-10-10 MED ORDER — FENTANYL CITRATE (PF) 100 MCG/2ML IJ SOLN
25.0000 ug | INTRAMUSCULAR | Status: DC | PRN
  Administered 2022-10-10: 50 ug via INTRAVENOUS

## 2022-10-10 MED ORDER — DOCUSATE SODIUM 100 MG PO CAPS
100.0000 mg | ORAL_CAPSULE | Freq: Two times a day (BID) | ORAL | Status: DC
  Administered 2022-10-12 – 2022-10-14 (×5): 100 mg via ORAL
  Filled 2022-10-10 (×7): qty 1

## 2022-10-10 MED ORDER — SODIUM CHLORIDE 0.9 % IV SOLN: INTRAVENOUS | Status: DC

## 2022-10-10 MED ORDER — THROMBIN 5000 UNITS EX SOLR
OROMUCOSAL | Status: DC | PRN
  Administered 2022-10-10: 5 mL via TOPICAL

## 2022-10-10 MED ORDER — CLEVIDIPINE BUTYRATE 0.5 MG/ML IV EMUL
INTRAVENOUS | Status: DC | PRN
  Administered 2022-10-10: 2 mg/h via INTRAVENOUS

## 2022-10-10 MED ORDER — THROMBIN 20000 UNITS EX KIT
PACK | CUTANEOUS | Status: DC | PRN
  Administered 2022-10-10: 20 mL via TOPICAL

## 2022-10-10 MED ORDER — MIDAZOLAM HCL 2 MG/2ML IJ SOLN: 1.0000 mg | Freq: Once | INTRAMUSCULAR | Status: AC

## 2022-10-10 MED ORDER — ACETAMINOPHEN 650 MG RE SUPP: 650.0000 mg | RECTAL | Status: DC | PRN

## 2022-10-10 MED ORDER — THROMBIN 5000 UNITS EX SOLR
CUTANEOUS | Status: AC
  Filled 2022-10-10: qty 10000

## 2022-10-10 MED ORDER — EPHEDRINE 5 MG/ML INJ
INTRAVENOUS | Status: AC
  Filled 2022-10-10: qty 5

## 2022-10-10 MED ORDER — DEXAMETHASONE SODIUM PHOSPHATE 10 MG/ML IJ SOLN
INTRAMUSCULAR | Status: DC | PRN
  Administered 2022-10-10: 10 mg via INTRAVENOUS

## 2022-10-10 MED ORDER — MIDAZOLAM HCL 2 MG/2ML IJ SOLN
INTRAMUSCULAR | Status: AC
  Administered 2022-10-10: 1 mg via INTRAVENOUS
  Filled 2022-10-10: qty 2

## 2022-10-10 MED ORDER — DEXAMETHASONE SODIUM PHOSPHATE 10 MG/ML IJ SOLN
INTRAMUSCULAR | Status: AC
  Filled 2022-10-10: qty 1

## 2022-10-10 MED ORDER — LIDOCAINE-EPINEPHRINE 1 %-1:100000 IJ SOLN
INTRAMUSCULAR | Status: AC
  Filled 2022-10-10: qty 1

## 2022-10-10 MED ORDER — FENTANYL CITRATE (PF) 250 MCG/5ML IJ SOLN
INTRAMUSCULAR | Status: AC
  Filled 2022-10-10: qty 5

## 2022-10-10 MED ORDER — LEVETIRACETAM IN NACL 1000 MG/100ML IV SOLN
1000.0000 mg | Freq: Once | INTRAVENOUS | Status: AC
  Administered 2022-10-10: 1000 mg via INTRAVENOUS
  Filled 2022-10-10: qty 100

## 2022-10-10 MED ORDER — LACTATED RINGERS IV SOLN: INTRAVENOUS | Status: DC

## 2022-10-10 MED ORDER — ROCURONIUM BROMIDE 10 MG/ML (PF) SYRINGE
PREFILLED_SYRINGE | INTRAVENOUS | Status: DC | PRN
  Administered 2022-10-10 (×2): 10 mg via INTRAVENOUS
  Administered 2022-10-10: 40 mg via INTRAVENOUS
  Administered 2022-10-10: 80 mg via INTRAVENOUS
  Administered 2022-10-10 (×2): 20 mg via INTRAVENOUS

## 2022-10-10 MED ORDER — PHENYLEPHRINE 80 MCG/ML (10ML) SYRINGE FOR IV PUSH (FOR BLOOD PRESSURE SUPPORT)
PREFILLED_SYRINGE | INTRAVENOUS | Status: DC | PRN
  Administered 2022-10-10: 40 ug via INTRAVENOUS
  Administered 2022-10-10: 80 ug via INTRAVENOUS
  Administered 2022-10-10: 40 ug via INTRAVENOUS

## 2022-10-10 MED ORDER — LIDOCAINE 2% (20 MG/ML) 5 ML SYRINGE
INTRAMUSCULAR | Status: AC
  Filled 2022-10-10: qty 5

## 2022-10-10 MED ORDER — FENTANYL CITRATE (PF) 100 MCG/2ML IJ SOLN: 50.0000 ug | Freq: Once | INTRAMUSCULAR | Status: AC

## 2022-10-10 MED ORDER — PHENYLEPHRINE HCL-NACL 20-0.9 MG/250ML-% IV SOLN
INTRAVENOUS | Status: DC | PRN
  Administered 2022-10-10: 25 ug/min via INTRAVENOUS

## 2022-10-10 MED ORDER — LABETALOL HCL 5 MG/ML IV SOLN: 10.0000 mg | INTRAVENOUS | Status: DC | PRN

## 2022-10-10 MED ORDER — SODIUM CHLORIDE 0.9 % IV SOLN: INTRAVENOUS | Status: DC | PRN

## 2022-10-10 MED ORDER — PROPOFOL 10 MG/ML IV BOLUS
INTRAVENOUS | Status: AC
  Filled 2022-10-10: qty 20

## 2022-10-10 MED ORDER — ROCURONIUM BROMIDE 10 MG/ML (PF) SYRINGE
PREFILLED_SYRINGE | INTRAVENOUS | Status: AC
  Filled 2022-10-10: qty 30

## 2022-10-10 MED ORDER — CLEVIDIPINE BUTYRATE 0.5 MG/ML IV EMUL
0.0000 mg/h | INTRAVENOUS | Status: DC
  Administered 2022-10-10: 4 mg/h via INTRAVENOUS
  Administered 2022-10-11 (×2): 2 mg/h via INTRAVENOUS
  Filled 2022-10-10 (×2): qty 50

## 2022-10-10 MED ORDER — ETOMIDATE 2 MG/ML IV SOLN
INTRAVENOUS | Status: DC | PRN
  Administered 2022-10-10: 2 mg via INTRAVENOUS
  Administered 2022-10-10: 18 mg via INTRAVENOUS

## 2022-10-10 MED ORDER — THROMBIN 20000 UNITS EX SOLR
CUTANEOUS | Status: AC
  Filled 2022-10-10: qty 20000

## 2022-10-10 MED ORDER — ONDANSETRON HCL 4 MG PO TABS: 4.0000 mg | ORAL_TABLET | ORAL | Status: DC | PRN

## 2022-10-10 MED ORDER — CHLORHEXIDINE GLUCONATE CLOTH 2 % EX PADS
6.0000 | MEDICATED_PAD | Freq: Once | CUTANEOUS | Status: AC
  Administered 2022-10-10: 6 via TOPICAL

## 2022-10-10 MED ORDER — MICROFIBRILLAR COLL HEMOSTAT EX PADS
MEDICATED_PAD | CUTANEOUS | Status: DC | PRN
  Administered 2022-10-10: 1 via TOPICAL

## 2022-10-10 MED ORDER — THROMBIN 5000 UNITS EX SOLR
CUTANEOUS | Status: AC
  Filled 2022-10-10: qty 5000

## 2022-10-10 MED ORDER — ORAL CARE MOUTH RINSE: 15.0000 mL | Freq: Once | OROMUCOSAL | Status: AC

## 2022-10-10 MED ORDER — ACETAMINOPHEN 500 MG PO TABS
1000.0000 mg | ORAL_TABLET | Freq: Once | ORAL | Status: AC
  Administered 2022-10-10: 1000 mg via ORAL

## 2022-10-10 MED ORDER — BUPIVACAINE-EPINEPHRINE (PF) 0.5% -1:200000 IJ SOLN
INTRAMUSCULAR | Status: DC | PRN
  Administered 2022-10-10: 5 mL via PERINEURAL

## 2022-10-10 MED ORDER — 0.9 % SODIUM CHLORIDE (POUR BTL) OPTIME
TOPICAL | Status: DC | PRN
  Administered 2022-10-10: 3000 mL

## 2022-10-10 MED ORDER — ACETAMINOPHEN 325 MG PO TABS
650.0000 mg | ORAL_TABLET | ORAL | Status: DC | PRN
  Administered 2022-10-10 – 2022-10-12 (×7): 650 mg via ORAL
  Filled 2022-10-10 (×6): qty 2

## 2022-10-10 MED ORDER — CHLORHEXIDINE GLUCONATE CLOTH 2 % EX PADS
6.0000 | MEDICATED_PAD | Freq: Every day | CUTANEOUS | Status: DC
  Administered 2022-10-10 – 2022-10-12 (×3): 6 via TOPICAL

## 2022-10-10 MED ORDER — CHLORHEXIDINE GLUCONATE 0.12 % MT SOLN: 15.0000 mL | Freq: Once | OROMUCOSAL | Status: AC

## 2022-10-10 MED ORDER — HYDROCODONE-ACETAMINOPHEN 5-325 MG PO TABS
1.0000 | ORAL_TABLET | ORAL | Status: DC | PRN
  Administered 2022-10-10 – 2022-10-14 (×13): 1 via ORAL
  Filled 2022-10-10 (×13): qty 1

## 2022-10-10 MED ORDER — HYDROMORPHONE HCL 1 MG/ML IJ SOLN: 0.5000 mg | INTRAMUSCULAR | Status: DC | PRN

## 2022-10-10 MED ORDER — MICROFIBRILLAR COLL HEMOSTAT EX PADS: MEDICATED_PAD | CUTANEOUS | Status: DC | PRN

## 2022-10-10 MED ORDER — SUGAMMADEX SODIUM 200 MG/2ML IV SOLN
INTRAVENOUS | Status: DC | PRN
  Administered 2022-10-10: 200 mg via INTRAVENOUS

## 2022-10-10 MED ORDER — POLYETHYLENE GLYCOL 3350 17 G PO PACK: 17.0000 g | PACK | Freq: Every day | ORAL | Status: DC | PRN

## 2022-10-10 MED ORDER — LIDOCAINE-EPINEPHRINE 1 %-1:100000 IJ SOLN
INTRAMUSCULAR | Status: DC | PRN
  Administered 2022-10-10: 5 mL

## 2022-10-10 MED ORDER — FENTANYL CITRATE (PF) 250 MCG/5ML IJ SOLN
INTRAMUSCULAR | Status: DC | PRN
  Administered 2022-10-10: 200 ug via INTRAVENOUS
  Administered 2022-10-10 (×3): 50 ug via INTRAVENOUS

## 2022-10-10 MED ORDER — BACITRACIN ZINC 500 UNIT/GM EX OINT
TOPICAL_OINTMENT | CUTANEOUS | Status: AC
  Filled 2022-10-10: qty 28.35

## 2022-10-10 MED ORDER — ONDANSETRON HCL 4 MG/2ML IJ SOLN
INTRAMUSCULAR | Status: DC | PRN
  Administered 2022-10-10: 4 mg via INTRAVENOUS

## 2022-10-10 MED ORDER — EPHEDRINE SULFATE-NACL 50-0.9 MG/10ML-% IV SOSY
PREFILLED_SYRINGE | INTRAVENOUS | Status: DC | PRN
  Administered 2022-10-10: 5 mg via INTRAVENOUS

## 2022-10-10 MED ORDER — ONDANSETRON HCL 4 MG/2ML IJ SOLN: 4.0000 mg | Freq: Once | INTRAMUSCULAR | Status: DC | PRN

## 2022-10-10 MED ORDER — ONDANSETRON HCL 4 MG/2ML IJ SOLN
INTRAMUSCULAR | Status: AC
  Filled 2022-10-10: qty 2

## 2022-10-10 MED ORDER — LIDOCAINE 2% (20 MG/ML) 5 ML SYRINGE
INTRAMUSCULAR | Status: DC | PRN
  Administered 2022-10-10: 80 mg via INTRAVENOUS

## 2022-10-10 MED ORDER — CHLORHEXIDINE GLUCONATE 0.12 % MT SOLN
OROMUCOSAL | Status: AC
  Administered 2022-10-10: 15 mL via OROMUCOSAL
  Filled 2022-10-10: qty 15

## 2022-10-10 MED ORDER — BUPIVACAINE-EPINEPHRINE (PF) 0.5% -1:200000 IJ SOLN
INTRAMUSCULAR | Status: AC
  Filled 2022-10-10: qty 30

## 2022-10-10 MED ORDER — PROMETHAZINE HCL 25 MG PO TABS: 12.5000 mg | ORAL_TABLET | ORAL | Status: DC | PRN

## 2022-10-10 MED ORDER — ETOMIDATE 2 MG/ML IV SOLN
INTRAVENOUS | Status: AC
  Filled 2022-10-10: qty 10

## 2022-10-10 MED ORDER — THROMBIN (RECOMBINANT) 5000 UNITS EX SOLR
CUTANEOUS | Status: DC | PRN
  Administered 2022-10-10 (×2): 1 via TOPICAL

## 2022-10-10 MED ORDER — ONDANSETRON HCL 4 MG/2ML IJ SOLN: 4.0000 mg | INTRAMUSCULAR | Status: DC | PRN

## 2022-10-10 MED ORDER — FENTANYL CITRATE (PF) 100 MCG/2ML IJ SOLN
INTRAMUSCULAR | Status: AC
  Filled 2022-10-10: qty 2

## 2022-10-10 MED ORDER — PHENYLEPHRINE 80 MCG/ML (10ML) SYRINGE FOR IV PUSH (FOR BLOOD PRESSURE SUPPORT)
PREFILLED_SYRINGE | INTRAVENOUS | Status: AC
  Filled 2022-10-10: qty 10

## 2022-10-10 MED ORDER — FLEET ENEMA 7-19 GM/118ML RE ENEM: 1.0000 | ENEMA | Freq: Once | RECTAL | Status: DC | PRN

## 2022-10-10 MED ORDER — BACITRACIN ZINC 500 UNIT/GM EX OINT
TOPICAL_OINTMENT | CUTANEOUS | Status: DC | PRN
  Administered 2022-10-10: 1 via TOPICAL

## 2022-10-10 MED ORDER — ACETAMINOPHEN 500 MG PO TABS
ORAL_TABLET | ORAL | Status: AC
  Filled 2022-10-10: qty 2

## 2022-10-10 SURGICAL SUPPLY — 92 items
BAG COUNTER SPONGE SURGICOUNT (BAG) ×2 IMPLANT
BAG SPNG CNTER NS LX DISP (BAG) ×2
BAND INSRT 18 STRL LF DISP RB (MISCELLANEOUS)
BAND RUBBER #18 3X1/16 STRL (MISCELLANEOUS) ×4 IMPLANT
BIT DRILL WIRE PASS 1.3MM (BIT) IMPLANT
BLADE CLIPPER SURG (BLADE) ×2 IMPLANT
BUR CARBIDE MATCH 3.0 (BURR) ×2 IMPLANT
BUR SPIRAL ROUTER 2.3 (BUR) ×2 IMPLANT
CANISTER SUCT 3000ML PPV (MISCELLANEOUS) ×2 IMPLANT
CASSETTE SUCT IRRIG SONOPET IQ (MISCELLANEOUS) IMPLANT
COVER BURR HOLE 14 (Orthopedic Implant) IMPLANT
COVER BURR HOLE UNIV 10 (Orthopedic Implant) IMPLANT
COVERAGE SUPPORT O-ARM STEALTH (MISCELLANEOUS) ×1 IMPLANT
DRAIN JACKSON RD 7FR 3/32 (WOUND CARE) IMPLANT
DRAIN JP 10F RND RADIO (DRAIN) IMPLANT
DRAPE MICROSCOPE SLANT 54X150 (MISCELLANEOUS) ×2 IMPLANT
DRAPE NEUROLOGICAL W/INCISE (DRAPES) ×2 IMPLANT
DRAPE SHEET LG 3/4 BI-LAMINATE (DRAPES) ×2 IMPLANT
DRAPE SURG 17X23 STRL (DRAPES) IMPLANT
DRAPE WARM FLUID 44X44 (DRAPES) ×2 IMPLANT
DRILL WIRE PASS 1.3MM (BIT)
DRSG AQUACEL AG ADV 3.5X 6 (GAUZE/BANDAGES/DRESSINGS) IMPLANT
DURAPREP 6ML APPLICATOR 50/CS (WOUND CARE) ×2 IMPLANT
ELECT COATED BLADE 2.86 ST (ELECTRODE) ×4 IMPLANT
ELECT REM PT RETURN 9FT ADLT (ELECTROSURGICAL) ×1
ELECTRODE REM PT RTRN 9FT ADLT (ELECTROSURGICAL) ×2 IMPLANT
EVACUATOR SILICONE 100CC (DRAIN) IMPLANT
FEE COVERAGE SUPPORT O-ARM (MISCELLANEOUS) ×2 IMPLANT
FORCEPS BIPO MALIS IRRIG 9X1.5 (NEUROSURGERY SUPPLIES) ×2 IMPLANT
GAUZE 4X4 16PLY ~~LOC~~+RFID DBL (SPONGE) IMPLANT
GAUZE SPONGE 4X4 12PLY STRL (GAUZE/BANDAGES/DRESSINGS) IMPLANT
GLOVE BIO SURGEON STRL SZ7 (GLOVE) ×8 IMPLANT
GLOVE BIOGEL PI IND STRL 7.0 (GLOVE) ×4 IMPLANT
GLOVE BIOGEL PI IND STRL 7.5 (GLOVE) ×4 IMPLANT
GLOVE BIOGEL PI IND STRL 8 (GLOVE) ×4 IMPLANT
GLOVE ECLIPSE 8.0 STRL XLNG CF (GLOVE) ×4 IMPLANT
GLOVE EXAM NITRILE XL STR (GLOVE) IMPLANT
GOWN STRL REUS W/ TWL LRG LVL3 (GOWN DISPOSABLE) IMPLANT
GOWN STRL REUS W/ TWL XL LVL3 (GOWN DISPOSABLE) ×6 IMPLANT
GOWN STRL REUS W/TWL 2XL LVL3 (GOWN DISPOSABLE) IMPLANT
GOWN STRL REUS W/TWL LRG LVL3 (GOWN DISPOSABLE) ×3
GOWN STRL REUS W/TWL XL LVL3 (GOWN DISPOSABLE) ×4
GRAFT DURAGEN MATRIX 3WX3L (Graft) ×1 IMPLANT
GRAFT DURAGEN MATRIX 3X3 SNGL (Graft) IMPLANT
HEMOSTAT POWDER KIT SURGIFOAM (HEMOSTASIS) ×2 IMPLANT
HEMOSTAT SNOW SURGICEL 2X4 (HEMOSTASIS) IMPLANT
HEMOSTAT SURGICEL 2X14 (HEMOSTASIS) ×2 IMPLANT
HEMOSTAT SURGICEL 2X4 FIBR (HEMOSTASIS) IMPLANT
IV NS 1000ML (IV SOLUTION) ×1
IV NS 1000ML BAXH (IV SOLUTION) ×2 IMPLANT
KIT BASIN OR (CUSTOM PROCEDURE TRAY) ×2 IMPLANT
KIT TURNOVER KIT B (KITS) ×2 IMPLANT
MARKER SPHERE PSV REFLC NDI (MISCELLANEOUS) ×6 IMPLANT
NEEDLE HYPO 22GX1.5 SAFETY (NEEDLE) ×2 IMPLANT
NS IRRIG 1000ML POUR BTL (IV SOLUTION) ×6 IMPLANT
PACK CRANIOTOMY CUSTOM (CUSTOM PROCEDURE TRAY) ×2 IMPLANT
PATTIES SURGICAL .5 X.5 (GAUZE/BANDAGES/DRESSINGS) IMPLANT
PATTIES SURGICAL .5 X3 (DISPOSABLE) IMPLANT
PATTIES SURGICAL 1X1 (DISPOSABLE) IMPLANT
PERFORATOR LRG  14-11MM (BIT) ×1
PERFORATOR LRG 14-11MM (BIT) ×2 IMPLANT
PIN MAYFIELD SKULL DISP (PIN) ×2 IMPLANT
RETRACTOR LONE STAR DISPOSABLE (INSTRUMENTS) ×4 IMPLANT
SCREW UNIII AXS SD 1.5X4 (Screw) IMPLANT
SET CARTRIDGE AND TUBING (SET/KITS/TRAYS/PACK) IMPLANT
SET TUBING IRRIGATION DISP (TUBING) ×2 IMPLANT
SOL ELECTROSURG ANTI STICK (MISCELLANEOUS) ×1
SOLUTION ELECTROSURG ANTI STCK (MISCELLANEOUS) ×4 IMPLANT
SPONGE NEURO XRAY DETECT 1X3 (DISPOSABLE) IMPLANT
SPONGE SURGIFOAM ABS GEL 100 (HEMOSTASIS) ×2 IMPLANT
SPONGE SURGIFOAM ABS GEL SZ50 (HEMOSTASIS) IMPLANT
STAPLER VISISTAT 35W (STAPLE) ×2 IMPLANT
STOCKINETTE 6  STRL (DRAPES) ×1
STOCKINETTE 6 STRL (DRAPES) ×2 IMPLANT
STRIP CLOSURE SKIN 1/2X4 (GAUZE/BANDAGES/DRESSINGS) ×2 IMPLANT
SUT BONE WAX W31G (SUTURE) IMPLANT
SUT ETHILON 3 0 FSL (SUTURE) IMPLANT
SUT ETHILON 3 0 PS 1 (SUTURE) IMPLANT
SUT NURALON 4 0 TR CR/8 (SUTURE) ×6 IMPLANT
SUT VIC AB 0 CT1 18XCR BRD8 (SUTURE) ×2 IMPLANT
SUT VIC AB 0 CT1 8-18 (SUTURE) ×1
SUT VIC AB 2-0 CP2 18 (SUTURE) ×2 IMPLANT
SUT VICRYL RAPIDE 4/0 PS 2 (SUTURE) ×2 IMPLANT
TIP SHEAR CVD EXTENDED 36KH (INSTRUMENTS) IMPLANT
TIP TISSUE SONOPET IQ STD 12 (TIP) IMPLANT
TOWEL GREEN STERILE (TOWEL DISPOSABLE) ×2 IMPLANT
TOWEL GREEN STERILE FF (TOWEL DISPOSABLE) ×2 IMPLANT
TRAY FOLEY MTR SLVR 16FR STAT (SET/KITS/TRAYS/PACK) ×2 IMPLANT
TUBE CONNECTING 12X1/4 (SUCTIONS) ×2 IMPLANT
UNDERPAD 30X36 HEAVY ABSORB (UNDERPADS AND DIAPERS) ×2 IMPLANT
WATER STERILE IRR 1000ML POUR (IV SOLUTION) ×2 IMPLANT
WRENCH TORQUE 36KHZ (INSTRUMENTS) IMPLANT

## 2022-10-10 NOTE — Anesthesia Procedure Notes (Signed)
Arterial Line Insertion Start/End4/30/2024 11:15 AM Performed by: Lonia Mad, CRNA, CRNA  Patient location: Pre-op. Preanesthetic checklist: patient identified, IV checked, site marked, risks and benefits discussed, surgical consent, monitors and equipment checked, pre-op evaluation, timeout performed and anesthesia consent Lidocaine 1% used for infiltration Right, radial was placed Catheter size: 20 G Hand hygiene performed  and maximum sterile barriers used   Attempts: 1 Procedure performed without using ultrasound guided technique. Following insertion, dressing applied and Biopatch. Post procedure assessment: normal and unchanged  Patient tolerated the procedure well with no immediate complications.

## 2022-10-10 NOTE — Progress Notes (Signed)
   Providing Compassionate, Quality Care - Together  NEUROSURGERY PROGRESS NOTE   S: No issues overnight.   O: EXAM:  BP (!) 178/94   Pulse 73   Temp 98 F (36.7 C) (Oral)   Resp 18   Ht 5\' 4"  (1.626 m)   Wt 66 kg   SpO2 93%   BMI 24.98 kg/m   Awake, alert, oriented x3 PERRL Speech fluent, appropriate  CNs grossly intact  5/5 BUE/BLE  No drift    ASSESSMENT:  72 y.o. 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  We discussed all risks, benefits and expected outcomes as well as alternatives to treatment.  Informed consent was obtained.  I answered all of the patient's and her family's questions. -Echo completed, EF stable, seen by cardiology and agreed with moderate risk, not prohibitive.  Will be careful with volume management.   Thank you for allowing me to participate in this patient's care.  Please do not hesitate to call with questions or concerns.   Monia Pouch, DO Neurosurgeon [facility] Cell: [phone]

## 2022-10-10 NOTE — Anesthesia Preprocedure Evaluation (Addendum)
Anesthesia Evaluation  Patient identified by MRN, date of birth, ID band Patient awake    Reviewed: Allergy & Precautions, NPO status , Patient's Chart, lab work & pertinent test results  Airway Mallampati: II  TM Distance: >3 FB Neck ROM: Full    Dental  (+) Teeth Intact, Dental Advisory Given, Implants,    Pulmonary COPD, Current Smoker, former smoker   Pulmonary exam normal breath sounds clear to auscultation       Cardiovascular hypertension, Pt. on medications +CHF  Normal cardiovascular exam Rhythm:Regular Rate:Normal  Echo 02/10/20: 1. Left ventricular ejection fraction, by estimation, is 30 to 35%. The  left ventricle has moderately decreased function. The left ventricle  demonstrates global hypokinesis. There is severe left ventricular  hypertrophy. Left ventricular diastolic  parameters are indeterminate.   2. Right ventricular systolic function is normal. The right ventricular  size is normal.   3. Left atrial size was moderately dilated.   4. The mitral valve is normal in structure. Mild mitral valve  regurgitation. No evidence of mitral stenosis.   5. The aortic valve is tricuspid. Aortic valve regurgitation is not  visualized. No aortic stenosis is present.   6. The inferior vena cava is normal in size with greater than 50%  respiratory variability, suggesting right atrial pressure of 3 mmHg.    Neuro/Psych Seizures -,  Left Brain Tumor    GI/Hepatic negative GI ROS, Neg liver ROS,,,  Endo/Other  Hypothyroidism    Renal/GU negative Renal ROS     Musculoskeletal  (+) Arthritis ,    Abdominal   Peds  Hematology negative hematology ROS (+)   Anesthesia Other Findings Day of surgery medications reviewed with the patient.  Reproductive/Obstetrics                              Anesthesia Physical Anesthesia Plan  ASA: 4  Anesthesia Plan: General   Post-op Pain  Management: Tylenol PO (pre-op)*   Induction: Intravenous  PONV Risk Score and Plan: 2 and Dexamethasone and Ondansetron  Airway Management Planned: Oral ETT  Additional Equipment: Arterial line, CVP and Ultrasound Guidance Line Placement  Intra-op Plan:   Post-operative Plan: Possible Post-op intubation/ventilation  Informed Consent: I have reviewed the patients History and Physical, chart, labs and discussed the procedure including the risks, benefits and alternatives for the proposed anesthesia with the patient or authorized representative who has indicated his/her understanding and acceptance.     Dental advisory given  Plan Discussed with: CRNA  Anesthesia Plan Comments:          Anesthesia Quick Evaluation

## 2022-10-10 NOTE — Progress Notes (Signed)
Progress Note  Patient Name: Penny Hicks Date of Encounter: 10/10/2022  Primary Cardiologist:   None   Subjective   No chest pain.  No SOB.    Inpatient Medications    Scheduled Meds:  dexamethasone (DECADRON) injection  4 mg Intravenous Q6H   metoprolol tartrate  25 mg Oral BID   mupirocin ointment  1 Application Nasal BID   pantoprazole (PROTONIX) IV  40 mg Intravenous Q24H   phenylephrine  1 suppository Rectal BID   Continuous Infusions:   ceFAZolin (ANCEF) IV     levETIRAcetam 500 mg (10/10/22 0539)   PRN Meds: albuterol, hydrALAZINE, hydrOXYzine, melatonin, midazolam   Vital Signs    Vitals:   10/09/22 1940 10/09/22 2339 10/10/22 0339 10/10/22 0714  BP: (!) 189/112 (!) 151/76 (!) 169/100 (!) 168/95  Pulse: 88 82 84 70  Resp: 16 17 16 16   Temp: 98.1 F (36.7 C) 97.7 F (36.5 C) 97.9 F (36.6 C) 98.1 F (36.7 C)  TempSrc: Oral Oral Oral Oral  SpO2: 95% 95% 91% 97%  Weight:      Height:        Intake/Output Summary (Last 24 hours) at 10/10/2022 1610 Last data filed at 10/09/2022 1831 Gross per 24 hour  Intake 680.18 ml  Output --  Net 680.18 ml   Filed Weights   10/05/22 1759 10/08/22 1222  Weight: 65 kg 65 kg    Telemetry    NSR, PVCs - Personally Reviewed  ECG    NA - Personally Reviewed  Physical Exam   GEN: No acute distress.   Neck: No  JVD Cardiac: RRR, no murmurs, rubs, or gallops.  Respiratory: Clear  to auscultation bilaterally. GI: Soft, nontender, non-distended  MS: No  edema; No deformity. Neuro:  Nonfocal  Psych: Normal affect   Labs    Chemistry Recent Labs  Lab 10/07/22 0336 10/08/22 0307 10/09/22 0332  NA 139 138 137  K 3.7 3.8 3.6  CL 108 105 105  CO2 25 28 26   GLUCOSE 159* 136* 136*  BUN 17 19 19   CREATININE 0.96 0.95 1.02*  CALCIUM 9.0 8.6* 8.7*  PROT 6.1* 5.7* 5.9*  ALBUMIN 3.3* 3.2* 3.3*  AST 38 45* 35  ALT 21 26 28   ALKPHOS 53 48 53  BILITOT 0.7 0.7 0.6  GFRNONAA >60 >60 58*  ANIONGAP  6 5 6      Hematology Recent Labs  Lab 10/07/22 0336 10/08/22 0307 10/09/22 0332  WBC 16.1* 13.8* 10.1  RBC 4.69 4.28 4.43  HGB 14.1 13.0 13.3  HCT 43.1 39.6 41.0  MCV 91.9 92.5 92.6  MCH 30.1 30.4 30.0  MCHC 32.7 32.8 32.4  RDW 12.6 12.6 12.5  PLT 324 293 293    Cardiac EnzymesNo results for input(s): "TROPONINI" in the last 168 hours. No results for input(s): "TROPIPOC" in the last 168 hours.   BNPNo results for input(s): "BNP", "PROBNP" in the last 168 hours.   DDimer No results for input(s): "DDIMER" in the last 168 hours.   Radiology    ECHOCARDIOGRAM COMPLETE  Result Date: 10/09/2022    ECHOCARDIOGRAM REPORT   Patient Name:   Penny Hicks Date of Exam: 10/09/2022 Medical Rec #:  [id number]         Height:       64.0 in Accession #:    [id number]        Weight:       143.3 lb Date of Birth:  [birth year]  BSA:          1.698 m Patient Age:    72 years          BP:           181/98 mmHg Patient Gender: F                 HR:           73 bpm. Exam Location:  Inpatient Procedure: 2D Echo, Cardiac Doppler and Color Doppler Indications:    Preop cardiovascular exam  History:        Patient has prior history of Echocardiogram examinations, most                 recent 02/10/2020. CHF; Risk Factors:Hypertension and                 Dyslipidemia.  Sonographer:    Wallie Char Referring Phys: [phone] [facility] SAMTANI IMPRESSIONS  1. Global hypokinesis with akinesis of the basal inferolateral wall.  2. Left ventricular ejection fraction, by estimation, is 30 to 35%. The left ventricle has moderately decreased function. The left ventricle demonstrates regional wall motion abnormalities (see scoring diagram/findings for description). There is moderate left ventricular hypertrophy. Left ventricular diastolic parameters are consistent with Grade I diastolic dysfunction (impaired relaxation).  3. Right ventricular systolic function is mildly reduced. The right ventricular size is normal.   4. Left atrial size was mildly dilated.  5. The mitral valve is normal in structure. Trivial mitral valve regurgitation. No evidence of mitral stenosis.  6. The aortic valve is tricuspid. Aortic valve regurgitation is not visualized. No aortic stenosis is present.  7. The inferior vena cava is dilated in size with >50% respiratory variability, suggesting right atrial pressure of 8 mmHg. FINDINGS  Left Ventricle: Left ventricular ejection fraction, by estimation, is 30 to 35%. The left ventricle has moderately decreased function. The left ventricle demonstrates regional wall motion abnormalities. The left ventricular internal cavity size was normal in size. There is moderate left ventricular hypertrophy. Left ventricular diastolic parameters are consistent with Grade I diastolic dysfunction (impaired relaxation). Right Ventricle: The right ventricular size is normal. Right ventricular systolic function is mildly reduced. Left Atrium: Left atrial size was mildly dilated. Right Atrium: Right atrial size was normal in size. Pericardium: There is no evidence of pericardial effusion. Mitral Valve: The mitral valve is normal in structure. Trivial mitral valve regurgitation. No evidence of mitral valve stenosis. MV peak gradient, 4.6 mmHg. The mean mitral valve gradient is 2.0 mmHg. Tricuspid Valve: The tricuspid valve is normal in structure. Tricuspid valve regurgitation is trivial. No evidence of tricuspid stenosis. Aortic Valve: The aortic valve is tricuspid. Aortic valve regurgitation is not visualized. No aortic stenosis is present. Aortic valve mean gradient measures 4.5 mmHg. Aortic valve peak gradient measures 7.7 mmHg. Aortic valve area, by VTI measures 3.05 cm. Pulmonic Valve: The pulmonic valve was normal in structure. Pulmonic valve regurgitation is trivial. No evidence of pulmonic stenosis. Aorta: The aortic root is normal in size and structure. Venous: The inferior vena cava is dilated in size with greater  than 50% respiratory variability, suggesting right atrial pressure of 8 mmHg. IAS/Shunts: No atrial level shunt detected by color flow Doppler. Additional Comments: Global hypokinesis with akinesis of the basal inferolateral wall.  LEFT VENTRICLE PLAX 2D LVIDd:         4.90 cm      Diastology LVIDs:         4.00 cm  LV e' medial:    4.10 cm/s LV PW:         1.30 cm      LV E/e' medial:  13.6 LV IVS:        1.20 cm      LV e' lateral:   7.81 cm/s LVOT diam:     2.30 cm      LV E/e' lateral: 7.1 LV SV:         83 LV SV Index:   49 LVOT Area:     4.15 cm  LV Volumes (MOD) LV vol d, MOD A2C: 114.0 ml LV vol d, MOD A4C: 119.0 ml LV vol s, MOD A2C: 72.4 ml LV vol s, MOD A4C: 73.5 ml LV SV MOD A2C:     41.6 ml LV SV MOD A4C:     119.0 ml LV SV MOD BP:      43.9 ml RIGHT VENTRICLE             IVC RV Basal diam:  3.60 cm     IVC diam: 2.40 cm RV S prime:     15.60 cm/s TAPSE (M-mode): 1.8 cm LEFT ATRIUM             Index        RIGHT ATRIUM           Index LA diam:        4.40 cm 2.59 cm/m   RA Area:     13.30 cm LA Vol (A2C):   59.4 ml 34.98 ml/m  RA Volume:   30.30 ml  17.85 ml/m LA Vol (A4C):   60.3 ml 35.51 ml/m LA Biplane Vol: 63.9 ml 37.63 ml/m  AORTIC VALVE AV Area (Vmax):    3.01 cm AV Area (Vmean):   2.87 cm AV Area (VTI):     3.05 cm AV Vmax:           138.50 cm/s AV Vmean:          99.050 cm/s AV VTI:            0.272 m AV Peak Grad:      7.7 mmHg AV Mean Grad:      4.5 mmHg LVOT Vmax:         100.40 cm/s LVOT Vmean:        68.350 cm/s LVOT VTI:          0.200 m LVOT/AV VTI ratio: 0.73  AORTA Ao Root diam: 3.60 cm Ao Asc diam:  3.70 cm MITRAL VALVE                TRICUSPID VALVE MV Area (PHT): 2.16 cm     TR Peak grad:   27.5 mmHg MV Area VTI:   2.91 cm     TR Vmax:        262.00 cm/s MV Peak grad:  4.6 mmHg MV Mean grad:  2.0 mmHg     SHUNTS MV Vmax:       1.07 m/s     Systemic VTI:  0.20 m MV Vmean:      56.1 cm/s    Systemic Diam: 2.30 cm MV Decel Time: 352 msec MV E velocity: 55.60 cm/s MV A  velocity: 116.00 cm/s MV E/A ratio:  0.48 Olga Millers MD Electronically signed by Olga Millers MD Signature Date/Time: 10/09/2022/12:59:49 PM    Final    MR BRAIN W WO CONTRAST  Result Date: 10/08/2022 CLINICAL DATA:  Initial evaluation for  brain/CNS neoplasm. Stereotactic protocol for surgical guidance. EXAM: MRI HEAD WITHOUT AND WITH CONTRAST TECHNIQUE: Multiplanar, multiecho pulse sequences of the brain and surrounding structures were obtained without and with intravenous contrast. CONTRAST:  6.58mL GADAVIST GADOBUTROL 1 MMOL/ML IV SOLN COMPARISON:  Prior studies from 10/06/22 and earlier. FINDINGS: Brain: Cerebral volume within normal limits. Patchy T2/FLAIR hyperintensity involving the periventricular deep white matter both cerebral hemispheres, consistent with chronic small vessel ischemic disease, moderately advanced in nature. Previous is identified round well-circumscribed extra-axial mass overlying the left parafalcine left parietal convexity. Lesion measures 3.6 x 3.7 x 4.1 cm (AP by transverse by craniocaudad). Associated T2/FLAIR signal abnormality within the adjacent posterior left cerebral hemisphere consistent with vasogenic edema. Localized left-to-right shift at the falx again noted. Small nodular focus of this lesion appears to cross the midline (series 6, image 110). On this postcontrast examination, this lesion is seen to abut but not invade the adjacent superior sagittal sinus. No other mass or abnormal enhancement. Examination to be utilized for surgical guidance purposes. Vascular: Normal intravascular enhancement seen throughout the intracranial circulation. Skull and upper cervical spine: Craniocervical junction within normal limits. Bone marrow signal intensity normal. No scalp soft tissue abnormality. Sinuses/Orbits: Globes orbital soft tissues within normal limits. Paranasal sinuses are largely clear. No significant mastoid effusion. Other: None. IMPRESSION: 1. 3.6 x 3.7 x 4.1 cm  extra-axial mass overlying the parafalcine left parietal convexity, most consistent with a meningioma. Associated vasogenic edema with regional mass effect and localized left-to-right shift as above. Lesion abuts but does not invade the adjacent superior sagittal sinus. Examination to be utilized for surgical guidance purposes. 2. Underlying moderately advanced chronic microvascular ischemic disease. Electronically Signed   By: Rise Mu M.D.   On: 10/08/2022 20:56    Cardiac Studies   Echo:  1. Global hypokinesis with akinesis of the basal inferolateral wall.   2. Left ventricular ejection fraction, by estimation, is 30 to 35%. The  left ventricle has moderately decreased function. The left ventricle  demonstrates regional wall motion abnormalities (see scoring  diagram/findings for description). There is  moderate left ventricular hypertrophy. Left ventricular diastolic  parameters are consistent with Grade I diastolic dysfunction (impaired  relaxation).   3. Right ventricular systolic function is mildly reduced. The right  ventricular size is normal.   4. Left atrial size was mildly dilated.   5. The mitral valve is normal in structure. Trivial mitral valve  regurgitation. No evidence of mitral stenosis.   6. The aortic valve is tricuspid. Aortic valve regurgitation is not  visualized. No aortic stenosis is present.   7. The inferior vena cava is dilated in size with >50% respiratory  variability, suggesting right atrial pressure of 8 mmHg.   Patient Profile     72 y.o. female patient with cerebral mass for excision today.  We saw preoperatively.  She has a history of cardiomyopathy without previous work up or treatment.    Assessment & Plan    Cardiomyopathy:    She seems to be euvolemic.  I will adjust meds post op prior to discharge.   LVH:  Will evaluate as an out patient.    Dyslipidemia:  Will start statin post op    HTN:  This is being managed in the context  of treating her CHF .  Started Metoprolol.    For questions or updates, please contact [facility] Please consult [email or web address] for contact info under Cardiology/STEMI.   Signed, Rollene Rotunda, MD  10/10/2022, 8:22 AM

## 2022-10-10 NOTE — Consult Note (Signed)
NAME:  Penny Hicks, MRN:  [id number], DOB:  1951-01-20, LOS: 5 ADMISSION DATE:  10/05/2022 CONSULTATION DATE:  10/10/2022 REFERRING MD:  Dawley - NSGY CHIEF COMPLAINT:  Hypertension post-crani   History of Present Illness:  72 year old woman who presented to [facility] 4/25 as a Code Stroke with right-sided weakness and new-onset seizures. 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 PMHx significant for HTN, HLD, COPD, hypothyroidism, OA.  Initial CT Head at [facility] 4/25 demonstrated 4.2cm dural-based lesion along parasagittal L parietal lobe with vasogenic edema and mass effect on the L lateral ventricular system, no ICH. CTA Head/Neck 4/25 showed no LVO/stenosis. Neuro was consulted via telehealth and recommended Keppra initiation and transfer to [facility] for further care/cEEG and MRI. Transferred to [facility] 4/25PM.   MRI Brain 4/26 showed 4.2 x 3.5cm dural-based mass with edema and L -> R shift, no other mass/intracranial abnormality. CT Chest/A/P was completed to r/o metastatic disease and showed age-indeterminate wedge deformities of T6-T7, T12; c/f pathologic fractures with ?underlying metastatic lesions. MRI Brain with/without contrast 4/28 showed 3.6 x 3.7 x 4.1cm extraaxial mass overlying parafalcine L parietal convexity, c/w meningioma.  Patient underwent meningioma resection with NSGY (Dr. Jake Samples) 4/30. EBL . Intraoperative course was grossly uncomplicated with the exception of hypertension to SBP 150s during induction. Extubated after the case. Remained hypertensive with SBP 160s-180s postoperatively requiring Cleviprex gtt initiation.  PCCM consulted for ICU transfer in the setting of hypertension post-crani.  Pertinent Medical History:   Past Medical History:  Diagnosis Date   Arthritis    COPD (chronic obstructive pulmonary disease) (HCC)    Hypercholesterolemia    Hypertension    Hypothyroidism    Significant Hospital Events: Including procedures, antibiotic start and stop dates in addition  to other pertinent events   4/25 - Presented to [facility] for Code Stroke. CT Head/CTA Head/Neck as above. Neuro teleconsult. Transferred to [facility]. 4/29 - Echo with akinesis of basal inferolateral wall, EF 30-35%, +RWMAs, moderate LVH, G1DD, mildly reduced RV function. 4/30 - POD#0 from resection of L parietal meningioma with NSGY (Dawley). EBL . Hypertensive to SBP 250s during induction, remains hypertensive to 180s post-op/after extubation. Required Cleviprex initiation and transfer to 4N.  Interim History / Subjective:  PCCM consulted for ICU admission  Objective:  Blood pressure (!) 149/74, pulse 97, temperature 98.3 F (36.8 C), resp. rate (!) 21, height 5\' 4"  (1.626 m), weight 66 kg, SpO2 94 %.        Intake/Output Summary (Last 24 hours) at 10/10/2022 1647 Last data filed at 10/10/2022 1543 Gross per 24 hour  Intake 1780.18 ml  Output 800 ml  Net 980.18 ml   Filed Weights   10/05/22 1759 10/08/22 1222 10/10/22 1022  Weight: 65 kg 65 kg 66 kg   Physical Examination: General: Well-appearing woman in NAD. HEENT: [place]/AT, anicteric sclera, PERRL, moist mucous membranes. Neuro: Awake, oriented x 3. Responds to verbal stimuli. Following commands consistently. Moves all 4 extremities spontaneously. Strength 5/5 in all extremities.  CV: RRR, no m/g/r. PULM: Chest clear GI: Soft, nontender, nondistended. Normoactive bowel sounds. Extremities: No LE edema noted. Skin: Warm/dry, clean occipital incision..  Ancillary tests personally reviewed:  Mild hypokalemia today. Normal CBC. Assessment & Plan:  L parietal meningioma S/p L parietal crani and meningioma resection 4/30 New onset seizures in the setting of above MRI Brain with/without contrast 4/28 showed 3.6 x 3.7 x 4.1cm extraaxial mass overlying parafalcine L parietal convexity, c/w meningioma. Now s/p meningioma resection 4/30 (Dawley). - NSGY primary, managing postoperative  care - Keppra for seizure ppx - Decadron per NSGY -  Neuroprotective measures: HOB > 30 degrees, normoglycemia, normothermia, electrolytes WNL  Hypertension Cardiomyopathy with LVH Hypertensive to SBP 250s with induction, subsequently SBP 160s-180s after case/post-extubation. Echo 4/29 with akinesis of basal inferolateral wall, EF 30-35%, +RWMAs, moderate LVH, G1DD, mildly reduced RV function. - Cleviprex gtt titrated to goal SBP - Goal SBP < 160 x 24H postoperatively, then can liberalize per NSGY - Resume home meds as able - Cardiology consulted, following  HLD - Resume statin postoperatively   COPD with emphysema - Continue supplemental O2 support as needed - Wean O2 for sat 88-92% - Resume home bronchodilators - Pulmonary hygiene  Best Practice: (right click and "Reselect all SmartList Selections" daily)   Diet/type: Regular consistency (see orders) DVT prophylaxis: SCD GI prophylaxis: N/A Lines: Arterial Line Foley:  Yes, and it is no longer needed Code Status:  full code Last date of multidisciplinary goals of care discussion [patient updated at bedside]  CRITICAL CARE Performed by: Lynnell Catalan   Total critical care time: 35 minutes  Critical care time was exclusive of separately billable procedures and treating other patients.  Critical care was necessary to treat or prevent imminent or life-threatening deterioration.  Critical care was time spent personally by me on the following activities: development of treatment plan with patient and/or surrogate as well as nursing, discussions with consultants, evaluation of patient's response to treatment, examination of patient, obtaining history from patient or surrogate, ordering and performing treatments and interventions, ordering and review of laboratory studies, ordering and review of radiographic studies, pulse oximetry, re-evaluation of patient's condition and participation in multidisciplinary rounds.  Lynnell Catalan, MD [facility] ICU Physician [facility] [place] [facility]   Pager: [phone] Mobile: [phone] After hours: [phone].

## 2022-10-10 NOTE — Progress Notes (Signed)
PROGRESS NOTE   Penny Hicks  ZOX:[id number] DOB: 1950-08-29 DOA: 10/05/2022 PCP: Assunta Found, MD   Brief Narrative:   72 year old white female home dwelling HTN HLD arthritis hypothyroid COPD HFrEF prior echo 30-35% Tendency to hyponatremia-?  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 Jake Samples involved in care as is epileptologist Dr. Melynda Ripple MRI brain 4/28 3 x 4 x 3 left parafalcine mass  4/29 echocardiogram EF 30% to 35% 4/30 meningioma flap surgery   Hospital-Problem based course  ?  Meningioma brain causing mass effect, confusion --- confusion has improved some Continues Decadron 4 every 6 IV, Keppra 500 IV every 12, saline 50 cc/H Seizure, has as needed Ativan i IV Long-term EEG overnight 4/27 is unrevealing for seizures although was a technically difficult study   Perioperative eval METS~ 3 Acs-nsqip CALCULATED=6.8% WHERE 8.8% IS THE AVERAGE patient is Below Average risk for the procedure Appreciated of cardiology expertise-she has been cleared for surgery and they will adjust meds postoperatively  Leukocytosis is expected in the setting of steroids she is afebrile  HFrEF last EF 30-35% Echo confirms EF 35-40% with no real change from 2021 and patient seems as best optimized and does not have any edema KVO IV overnight as +3.7lL Watch fluid status in the immediate postop period-not on any prior to admission diuretics  HTN-probably whitecoat HTN perioperatively Hold lisinopril  20,  started this admission metoprolol 12.5 twice daily because of uncontrolled pressure  COPD, Gold stage II As needed albuterol reordered-monitor  Transaminitis is probably just a variant-monitor  DVT prophylaxis: Heparin Code Status: Full Family Communication: Discussed with father members at the bedside including husband Disposition:  Status is: Inpatient Remains inpatient appropriate because:   meningioma surgery a.m. 4/30    Subjective:  Quite anxious earlier today and yesterday accounting for the high blood pressures-was tearful when I walked in About 10 family members at the bedside  Objective: Vitals:   10/09/22 2339 10/10/22 0339 10/10/22 0714 10/10/22 1022  BP: (!) 151/76 (!) 169/100 (!) 168/95 (!) 188/103  Pulse: 82 84 70 73  Resp: 17 16 16 18   Temp: 97.7 F (36.5 C) 97.9 F (36.6 C) 98.1 F (36.7 C) 98 F (36.7 C)  TempSrc: Oral Oral Oral Oral  SpO2: 95% 91% 97% 93%  Weight:    66 kg  Height:    5\' 4"  (1.626 m)    Intake/Output Summary (Last 24 hours) at 10/10/2022 1026 Last data filed at 10/09/2022 1831 Gross per 24 hour  Intake 680.18 ml  Output --  Net 680.18 ml    Filed Weights   10/05/22 1759 10/08/22 1222 10/10/22 1022  Weight: 65 kg 65 kg 66 kg    Examination:  EOMI NCAT l no focal deficit no icterus no pallor Chest is clear no added sound no wheeze no rales no rhonchi Abdomen is soft Able to raise her arms and legs  Data Reviewed: personally reviewed   CBC    Component Value Date/Time   WBC 10.1 10/09/2022 0332   RBC 4.43 10/09/2022 0332   HGB 13.3 10/09/2022  0332   HCT 41.0 10/09/2022 0332   PLT 293 10/09/2022 0332   MCV 92.6 10/09/2022 0332   MCH 30.0 10/09/2022 0332   MCHC 32.4 10/09/2022 0332   RDW 12.5 10/09/2022 0332   LYMPHSABS 1.4 10/05/2022 1732   MONOABS 0.3 10/05/2022 1732   EOSABS 0.0 10/05/2022 1732   BASOSABS 0.0 10/05/2022 1732      Latest Ref Rng & Units 10/09/2022    3:32 AM 10/08/2022    3:07 AM  10/07/2022    3:36 AM  CMP  Glucose 70 - 99 mg/dL 409  811  914   BUN 8 - 23 mg/dL 19  19  17    Creatinine 0.44 - 1.00 mg/dL 7.82  9.56  2.13   Sodium 135 - 145 mmol/L 137  138  139   Potassium 3.5 - 5.1 mmol/L 3.6  3.8  3.7   Chloride 98 - 111 mmol/L 105  105  108   CO2 22 - 32 mmol/L 26  28  25    Calcium 8.9 - 10.3 mg/dL 8.7  8.6  9.0   Total Protein 6.5 - 8.1 g/dL 5.9  5.7  6.1   Total Bilirubin 0.3 - 1.2 mg/dL 0.6  0.7  0.7   Alkaline Phos 38 - 126 U/L 53  48  53   AST 15 - 41 U/L 35  45  38   ALT 0 - 44 U/L 28  26  21       Radiology Studies: ECHOCARDIOGRAM COMPLETE  Result Date: 10/09/2022    ECHOCARDIOGRAM REPORT   Patient Name:   Penny Hicks Date of Exam: 10/09/2022 Medical Rec #:  [id number]         Height:       64.0 in Accession #:    [id number]        Weight:       143.3 lb Date of Birth:  08/17/1950          BSA:          1.698 m Patient Age:    72 years          BP:           181/98 mmHg Patient Gender: F                 HR:           73 bpm. Exam Location:  Inpatient Procedure: 2D Echo, Cardiac Doppler and Color Doppler Indications:    Preop cardiovascular exam  History:        Patient has prior history of Echocardiogram examinations, most                 recent 02/10/2020. CHF; Risk Factors:Hypertension and                 Dyslipidemia.  Sonographer:    Wallie Char Referring Phys: [phone] [facility] Darrielle Pflieger IMPRESSIONS  1. Global hypokinesis with akinesis of the basal inferolateral wall.  2. Left ventricular ejection fraction, by estimation, is 30 to 35%. The left ventricle has moderately decreased function. The left ventricle demonstrates regional wall motion abnormalities (see scoring diagram/findings for description). There is moderate left ventricular hypertrophy. Left ventricular diastolic parameters are consistent with Grade I diastolic dysfunction (impaired relaxation).  3. Right ventricular systolic function is mildly reduced. The right ventricular size is normal.  4.  Left atrial size was mildly dilated.  5. The mitral valve is normal in structure. Trivial mitral valve regurgitation. No evidence  of mitral stenosis.  6. The aortic valve is tricuspid. Aortic valve regurgitation is not visualized. No aortic stenosis is present.  7. The inferior vena cava is dilated in size with >50% respiratory variability, suggesting right atrial pressure of 8 mmHg. FINDINGS  Left Ventricle: Left ventricular ejection fraction, by estimation, is 30 to 35%. The left ventricle has moderately decreased function. The left ventricle demonstrates regional wall motion abnormalities. The left ventricular internal cavity size was normal in size. There is moderate left ventricular hypertrophy. Left ventricular diastolic parameters are consistent with Grade I diastolic dysfunction (impaired relaxation). Right Ventricle: The right ventricular size is normal. Right ventricular systolic function is mildly reduced. Left Atrium: Left atrial size was mildly dilated. Right Atrium: Right atrial size was normal in size. Pericardium: There is no evidence of pericardial effusion. Mitral Valve: The mitral valve is normal in structure. Trivial mitral valve regurgitation. No evidence of mitral valve stenosis. MV peak gradient, 4.6 mmHg. The mean mitral valve gradient is 2.0 mmHg. Tricuspid Valve: The tricuspid valve is normal in structure. Tricuspid valve regurgitation is trivial. No evidence of tricuspid stenosis. Aortic Valve: The aortic valve is tricuspid. Aortic valve regurgitation is not visualized. No aortic stenosis is present. Aortic valve mean gradient measures 4.5 mmHg. Aortic valve peak gradient measures 7.7 mmHg. Aortic valve area, by VTI measures 3.05 cm. Pulmonic Valve: The pulmonic valve was normal in structure. Pulmonic valve regurgitation is trivial. No evidence of pulmonic stenosis. Aorta: The aortic root is normal in size and structure. Venous: The inferior vena cava is dilated in size with greater than  50% respiratory variability, suggesting right atrial pressure of 8 mmHg. IAS/Shunts: No atrial level shunt detected by color flow Doppler. Additional Comments: Global hypokinesis with akinesis of the basal inferolateral wall.  LEFT VENTRICLE PLAX 2D LVIDd:         4.90 cm      Diastology LVIDs:         4.00 cm      LV e' medial:    4.10 cm/s LV PW:         1.30 cm      LV E/e' medial:  13.6 LV IVS:        1.20 cm      LV e' lateral:   7.81 cm/s LVOT diam:     2.30 cm      LV E/e' lateral: 7.1 LV SV:         83 LV SV Index:   49 LVOT Area:     4.15 cm  LV Volumes (MOD) LV vol d, MOD A2C: 114.0 ml LV vol d, MOD A4C: 119.0 ml LV vol s, MOD A2C: 72.4 ml LV vol s, MOD A4C: 73.5 ml LV SV MOD A2C:     41.6 ml LV SV MOD A4C:     119.0 ml LV SV MOD BP:      43.9 ml RIGHT VENTRICLE             IVC RV Basal diam:  3.60 cm     IVC diam: 2.40 cm RV S prime:     15.60 cm/s TAPSE (M-mode): 1.8 cm LEFT ATRIUM             Index        RIGHT ATRIUM           Index LA diam:        4.40 cm 2.59 cm/m   RA Area:     13.30 cm  LA Vol (A2C):   59.4 ml 34.98 ml/m  RA Volume:   30.30 ml  17.85 ml/m LA Vol (A4C):   60.3 ml 35.51 ml/m LA Biplane Vol: 63.9 ml 37.63 ml/m  AORTIC VALVE AV Area (Vmax):    3.01 cm AV Area (Vmean):   2.87 cm AV Area (VTI):     3.05 cm AV Vmax:           138.50 cm/s AV Vmean:          99.050 cm/s AV VTI:            0.272 m AV Peak Grad:      7.7 mmHg AV Mean Grad:      4.5 mmHg LVOT Vmax:         100.40 cm/s LVOT Vmean:        68.350 cm/s LVOT VTI:          0.200 m LVOT/AV VTI ratio: 0.73  AORTA Ao Root diam: 3.60 cm Ao Asc diam:  3.70 cm MITRAL VALVE                TRICUSPID VALVE MV Area (PHT): 2.16 cm     TR Peak grad:   27.5 mmHg MV Area VTI:   2.91 cm     TR Vmax:        262.00 cm/s MV Peak grad:  4.6 mmHg MV Mean grad:  2.0 mmHg     SHUNTS MV Vmax:       1.07 m/s     Systemic VTI:  0.20 m MV Vmean:      56.1 cm/s    Systemic Diam: 2.30 cm MV Decel Time: 352 msec MV E velocity: 55.60 cm/s MV A  velocity: 116.00 cm/s MV E/A ratio:  0.48 Olga Millers MD Electronically signed by Olga Millers MD Signature Date/Time: 10/09/2022/12:59:49 PM    Final    MR BRAIN W WO CONTRAST  Result Date: 10/08/2022 CLINICAL DATA:  Initial evaluation for brain/CNS neoplasm. Stereotactic protocol for surgical guidance. EXAM: MRI HEAD WITHOUT AND WITH CONTRAST TECHNIQUE: Multiplanar, multiecho pulse sequences of the brain and surrounding structures were obtained without and with intravenous contrast. CONTRAST:  6.53mL GADAVIST GADOBUTROL 1 MMOL/ML IV SOLN COMPARISON:  Prior studies from 10/06/22 and earlier. FINDINGS: Brain: Cerebral volume within normal limits. Patchy T2/FLAIR hyperintensity involving the periventricular deep white matter both cerebral hemispheres, consistent with chronic small vessel ischemic disease, moderately advanced in nature. Previous is identified round well-circumscribed extra-axial mass overlying the left parafalcine left parietal convexity. Lesion measures 3.6 x 3.7 x 4.1 cm (AP by transverse by craniocaudad). Associated T2/FLAIR signal abnormality within the adjacent posterior left cerebral hemisphere consistent with vasogenic edema. Localized left-to-right shift at the falx again noted. Small nodular focus of this lesion appears to cross the midline (series 6, image 110). On this postcontrast examination, this lesion is seen to abut but not invade the adjacent superior sagittal sinus. No other mass or abnormal enhancement. Examination to be utilized for surgical guidance purposes. Vascular: Normal intravascular enhancement seen throughout the intracranial circulation. Skull and upper cervical spine: Craniocervical junction within normal limits. Bone marrow signal intensity normal. No scalp soft tissue abnormality. Sinuses/Orbits: Globes orbital soft tissues within normal limits. Paranasal sinuses are largely clear. No significant mastoid effusion. Other: None. IMPRESSION: 1. 3.6 x 3.7 x 4.1 cm  extra-axial mass overlying the parafalcine left parietal convexity, most consistent with a meningioma. Associated vasogenic edema with regional mass effect and localized left-to-right shift as  above. Lesion abuts but does not invade the adjacent superior sagittal sinus. Examination to be utilized for surgical guidance purposes. 2. Underlying moderately advanced chronic microvascular ischemic disease. Electronically Signed   By: Rise Mu M.D.   On: 10/08/2022 20:56     Scheduled Meds:  acetaminophen       [MAR Hold] dexamethasone (DECADRON) injection  4 mg Intravenous Q6H   [MAR Hold] metoprolol tartrate  25 mg Oral BID   [MAR Hold] mupirocin ointment  1 Application Nasal BID   [MAR Hold] pantoprazole (PROTONIX) IV  40 mg Intravenous Q24H   [MAR Hold] phenylephrine  1 suppository Rectal BID   Continuous Infusions:   ceFAZolin (ANCEF) IV     lactated ringers     [MAR Hold] levETIRAcetam 500 mg (10/10/22 0539)     LOS: 5 days   Time spent: 49  Rhetta Mura, MD [facility] To contact the attending provider between 7A-7P or the covering provider during after hours 7P-7A, please log into the web site [email or web address] and access using universal [place] password for that web site. If you do not have the password, please call the hospital operator.  10/10/2022, 10:26 AM

## 2022-10-10 NOTE — Op Note (Signed)
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 Kendell Bane C. Cosby Proby, DO  ASSISTANT: Dr. Lisbeth Renshaw, MD; Patrici Ranks, PA  ANESTHESIA: General Endotracheal  EBL: 200 cc  SPECIMENS: Left parietal tumor  DRAINS: none  COMPLICATIONS: None  CONDITION: Hemodynamically stable  HISTORY: Psalms Olarte is a 72 y.o. female presenting with altered mental status.  Imaging workup revealed a 4 cm left parafalcine extra-axial tumor with regional mass effect and cerebral edema.  EEG was performed as she was concerned to have some type of seizure and therefore she was placed on antiepileptics.  MRI revealed large extra-axial tumor consistent with likely meningioma with surrounding edema.  I recommended surgical intervention in the form of a left parietal craniotomy for resection of tumor.  All risks, benefits and expected outcomes were discussed and agreed upon.  Informed consent was obtained and witnessed.  PROCEDURE IN DETAIL: The patient was brought to the operating room. After induction of general anesthesia, the patient was positioned on the operative table in the lateral position, left side up. All pressure points were meticulously padded.  Her head was placed in a [place] head holder, using stereotactic navigation, her MRI was registered using anatomical landmarks.  This was verified with excellent accuracy with anatomic landmarks.  The left parietal region was clipped free of hair.  Skin incision was then marked out and prepped and draped in the usual sterile fashion. Physician driven timeout was performed.  Local anesthetic was injected into the wound.   Using 10 blade, incision was made sharply in a lazy S fashion over the left parietal region, crossing midline.  Paracranium was elevated with periosteal elevator.  Raney clips were applied bilaterally.  Self-retaining retractors were placed in the wound.  Using navigation, craniotomy was planned just medial to and just contralateral to the superior sagittal sinus.  Bur holes were created on both sides of the superior sagittal sinus and carefully dissected free from the cranium without difficulty.  Craniotomy was then elevated in standard fashion.  The superior sagittal sinus was then covered with Gelfoam.  The microscope was then sterilely draped and brought into the field for the remainder the procedure.  Epidural hemostasis was achieved with Surgifoam.  A curvilinear durotomy was performed with a 15 blade and Metzenbaum scissors reflected medially with 4-0 Nurolon suture.  This was performed to the edge of the superior sagittal sinus.  The tumor was slightly adherent to the dura and was disconnected from the durotomy flap via bipolar forceps and microscissors.  Using bipolar forceps and microscissors lateral dissection was performed freeing the cerebral parenchyma along the lateral aspect of the tumor along the capsule plane.  This plane was easily developed.  This was then continued anteriorly and posteriorly.  There was a posterior bridging vein that appeared to be providing venous drainage from the parietal lobe that was carefully protected.  This was dissected from the tumor capsule via microscissors.  Attention was then turned to disconnecting the tumor from the fall seen attachment.  This was performed with bipolar forceps and microscissors carried all the way down to the bottom of the falx.  Once this was completed, the vascular supply had been disconnected.  The falx did not appear violated.  There was  no superior sagittal sinus invasion.  The tumor was then circumferentially disconnected along the  capsule from small pial connections using bipolar forceps and microscissors.  Tumor was elevated without difficulty and sent for permanent pathology.  There is a small tumor on imaging on the contralateral fall seen side therefore this was opened sharply using neuronavigation and a 15 blade.  The tumor was then resected with Metzenbaum scissors and came out en bloc.  Hemostasis was achieved in the falx with bipolar forceps.  The resection cavity was then copiously irrigated and noted to be excellently hemostatic.  Hemostasis was then further achieved with Surgifoam and cotton balls.  These were sent for series of minutes.  And then removed.  The wound again was copiously irrigated and noted to be excellently hemostatic.  The resection cavity was lined with Surgicel.  The durotomy flap was then reflected to its original position and closed with 4-0 Nurolon suture in interrupted fashion.  Epidural hemostasis was achieved with Surgifoam.  DuraGen was then placed over the durotomy site, with noncompressed Gelfoam.  The craniotomy flap was then affixed with the cranial plating system to its original position.  Self-retaining retractors were taken of the wound.  Raney clips were removed, the wound was closed in layers, galea with 2-0 Vicryl sutures.  Skin was closed with staples.  Sterile dressing applied.  At the end of the case all sponge, needle, and instrument counts were correct. The patient was then transferred to the stretcher, extubated, and taken to the post-anesthesia care unit in stable hemodynamic condition.

## 2022-10-10 NOTE — Transfer of Care (Signed)
Immediate Anesthesia Transfer of Care Note  Patient: Penny Hicks  Procedure(s) Performed: LEFT CRANIOTOMY TUMOR EXCISION (Left) APPLICATION OF CRANIAL NAVIGATION (Left)  Patient Location: PACU  Anesthesia Type:General  Level of Consciousness: awake, alert , and oriented  Airway & Oxygen Therapy: Patient Spontanous Breathing and Patient connected to face mask oxygen  Post-op Assessment: Report given to RN and Post -op Vital signs reviewed and stable  Post vital signs: Reviewed and stable  Last Vitals:  Vitals Value Taken Time  BP 179/84 10/10/22 1606  Temp    Pulse 100 10/10/22 1611  Resp 20 10/10/22 1611  SpO2 97 % 10/10/22 1611  Vitals shown include unvalidated device data.  Last Pain:  Vitals:   10/10/22 1038  TempSrc:   PainSc: 0-No pain         Complications: No notable events documented.

## 2022-10-10 NOTE — Anesthesia Postprocedure Evaluation (Signed)
Anesthesia Post Note  Patient: Penny Hicks  Procedure(s) Performed: LEFT CRANIOTOMY TUMOR EXCISION (Left) APPLICATION OF CRANIAL NAVIGATION (Left)     Patient location during evaluation: PACU Anesthesia Type: General Level of consciousness: awake and alert Pain management: pain level controlled Vital Signs Assessment: post-procedure vital signs reviewed and stable Respiratory status: spontaneous breathing, nonlabored ventilation, respiratory function stable and patient connected to nasal cannula oxygen Cardiovascular status: blood pressure returned to baseline and stable Postop Assessment: no apparent nausea or vomiting Anesthetic complications: no   No notable events documented.  Last Vitals:  Vitals:   10/10/22 1615 10/10/22 1630  BP: (!) 183/165 (!) 149/74  Pulse: (!) 101 97  Resp: 20 (!) 21  Temp:    SpO2: (!) 88% 94%    Last Pain:  Vitals:   10/10/22 1630  TempSrc:   PainSc: 10-Worst pain ever                 Collene Schlichter

## 2022-10-10 NOTE — Anesthesia Procedure Notes (Signed)
Central Venous Catheter Insertion Performed by: Collene Schlichter, MD, anesthesiologist Start/End4/30/2024 11:05 AM, 10/10/2022 11:15 AM Preanesthetic checklist: patient identified, IV checked, site marked, risks and benefits discussed, surgical consent, monitors and equipment checked, pre-op evaluation, timeout performed and anesthesia consent Position: Trendelenburg Lidocaine 1% used for infiltration and patient sedated Hand hygiene performed , maximum sterile barriers used  and Seldinger technique used Catheter size: 8 Fr Total catheter length 16. Central line was placed.Double lumen Procedure performed using ultrasound guided technique. Ultrasound Notes:anatomy identified, needle tip was noted to be adjacent to the nerve/plexus identified, no ultrasound evidence of intravascular and/or intraneural injection and image(s) printed for medical record Attempts: 1 Following insertion, line sutured, dressing applied and Biopatch. Post procedure assessment: blood return through all ports, free fluid flow and no air  Patient tolerated the procedure well with no immediate complications.

## 2022-10-10 NOTE — Anesthesia Procedure Notes (Signed)
Procedure Name: Intubation Date/Time: 10/10/2022 11:56 AM  Performed by: Garfield Cornea, CRNAPre-anesthesia Checklist: Patient identified, Emergency Drugs available, Suction available and Patient being monitored Patient Re-evaluated:Patient Re-evaluated prior to induction Oxygen Delivery Method: Circle System Utilized Preoxygenation: Pre-oxygenation with 100% oxygen Induction Type: IV induction Ventilation: Mask ventilation without difficulty Laryngoscope Size: Mac and 3 Grade View: Grade I Tube type: Oral Tube size: 7.5 mm Number of attempts: 1 Airway Equipment and Method: Stylet Placement Confirmation: ETT inserted through vocal cords under direct vision, positive ETCO2 and breath sounds checked- equal and bilateral Secured at: 22 cm Tube secured with: Tape Dental Injury: Teeth and Oropharynx as per pre-operative assessment

## 2022-10-11 ENCOUNTER — Encounter (HOSPITAL_COMMUNITY): Payer: Self-pay | Admitting: Neurological Surgery

## 2022-10-11 ENCOUNTER — Other Ambulatory Visit (HOSPITAL_COMMUNITY): Payer: Self-pay

## 2022-10-11 ENCOUNTER — Telehealth (HOSPITAL_COMMUNITY): Payer: Self-pay | Admitting: Pharmacy Technician

## 2022-10-11 DIAGNOSIS — I429 Cardiomyopathy, unspecified: Secondary | ICD-10-CM | POA: Diagnosis not present

## 2022-10-11 LAB — CBC
HCT: 40.4 % (ref 36.0–46.0)
Hemoglobin: 13.7 g/dL (ref 12.0–15.0)
MCH: 31.1 pg (ref 26.0–34.0)
MCHC: 33.9 g/dL (ref 30.0–36.0)
MCV: 91.8 fL (ref 80.0–100.0)
Platelets: 276 10*3/uL (ref 150–400)
RBC: 4.4 MIL/uL (ref 3.87–5.11)
RDW: 12.6 % (ref 11.5–15.5)
WBC: 12.4 10*3/uL — ABNORMAL HIGH (ref 4.0–10.5)
nRBC: 0 % (ref 0.0–0.2)

## 2022-10-11 LAB — MAGNESIUM: Magnesium: 1.7 mg/dL (ref 1.7–2.4)

## 2022-10-11 LAB — BASIC METABOLIC PANEL
Anion gap: 8 (ref 5–15)
BUN: 14 mg/dL (ref 8–23)
CO2: 25 mmol/L (ref 22–32)
Calcium: 8.4 mg/dL — ABNORMAL LOW (ref 8.9–10.3)
Chloride: 104 mmol/L (ref 98–111)
Creatinine, Ser: 0.85 mg/dL (ref 0.44–1.00)
GFR, Estimated: >60 mL/min (ref >60)
Glucose, Bld: 139 mg/dL — ABNORMAL HIGH (ref 70–99)
Potassium: 3.5 mmol/L (ref 3.5–5.1)
Sodium: 137 mmol/L (ref 135–145)

## 2022-10-11 LAB — PROTIME-INR
INR: 1.1 (ref 0.8–1.2)
Prothrombin Time: 14.4 seconds (ref 11.4–15.2)

## 2022-10-11 LAB — PHOSPHORUS: Phosphorus: 3.2 mg/dL (ref 2.5–4.6)

## 2022-10-11 MED ORDER — PANTOPRAZOLE SODIUM 40 MG PO TBEC
40.0000 mg | DELAYED_RELEASE_TABLET | Freq: Every day | ORAL | Status: DC
  Administered 2022-10-11 – 2022-10-13 (×3): 40 mg via ORAL
  Filled 2022-10-11 (×3): qty 1

## 2022-10-11 MED ORDER — SACUBITRIL-VALSARTAN 49-51 MG PO TABS
1.0000 | ORAL_TABLET | Freq: Two times a day (BID) | ORAL | Status: DC
  Administered 2022-10-11 – 2022-10-12 (×4): 1 via ORAL
  Filled 2022-10-11 (×6): qty 1

## 2022-10-11 MED ORDER — DEXAMETHASONE SODIUM PHOSPHATE 4 MG/ML IJ SOLN
2.0000 mg | Freq: Four times a day (QID) | INTRAMUSCULAR | Status: DC
  Administered 2022-10-11 – 2022-10-12 (×4): 2 mg via INTRAVENOUS
  Filled 2022-10-11 (×4): qty 1

## 2022-10-11 MED ORDER — ATORVASTATIN CALCIUM 10 MG PO TABS
20.0000 mg | ORAL_TABLET | Freq: Every day | ORAL | Status: DC
  Administered 2022-10-11 – 2022-10-14 (×4): 20 mg via ORAL
  Filled 2022-10-11 (×4): qty 2

## 2022-10-11 MED ORDER — LEVETIRACETAM 500 MG PO TABS
500.0000 mg | ORAL_TABLET | Freq: Two times a day (BID) | ORAL | Status: DC
  Administered 2022-10-11 – 2022-10-14 (×6): 500 mg via ORAL
  Filled 2022-10-11 (×6): qty 1

## 2022-10-11 MED ORDER — MAGNESIUM SULFATE 2 GM/50ML IV SOLN
2.0000 g | Freq: Once | INTRAVENOUS | Status: AC
  Administered 2022-10-11: 2 g via INTRAVENOUS
  Filled 2022-10-11: qty 50

## 2022-10-11 MED ORDER — POTASSIUM CHLORIDE CRYS ER 20 MEQ PO TBCR
20.0000 meq | EXTENDED_RELEASE_TABLET | Freq: Two times a day (BID) | ORAL | Status: AC
  Administered 2022-10-11 (×2): 20 meq via ORAL
  Filled 2022-10-11 (×2): qty 1

## 2022-10-11 MED FILL — Thrombin For Soln 5000 Unit: CUTANEOUS | Qty: 5000 | Status: AC

## 2022-10-11 NOTE — Telephone Encounter (Signed)
Patient Advocate Encounter   Received notification that prior authorization for Entresto 24-26MG  tablets is required.   PA submitted on 10/11/2022 [employer] Electronic PA Form Status is pending       Roland Earl, CPhT Pharmacy Patient Advocate Specialist [facility] Patient Advocate Team Direct Number: [phone]  Fax: [phone]

## 2022-10-11 NOTE — Telephone Encounter (Signed)
Pharmacy Patient Advocate Encounter  Insurance verification completed.    The patient is insured through [employer]   The patient is currently admitted and ran test claims for the following: Entresto.  Copays and coinsurance results were relayed to Inpatient clinical team.

## 2022-10-11 NOTE — Progress Notes (Signed)
Progress Note  Patient Name: Penny Hicks Date of Encounter: 10/11/2022  Primary Cardiologist:   None   Subjective   She looks great.  No SOB.  Mild headache   Inpatient Medications    Scheduled Meds:  Chlorhexidine Gluconate Cloth  6 each Topical Daily   dexamethasone (DECADRON) injection  2 mg Intravenous Q6H   docusate sodium  100 mg Oral BID   levETIRAcetam  500 mg Oral BID   metoprolol tartrate  25 mg Oral BID   mupirocin ointment  1 Application Nasal BID   pantoprazole  40 mg Oral QHS   potassium chloride  20 mEq Oral BID   sacubitril-valsartan  1 tablet Oral BID   Continuous Infusions:  sodium chloride Stopped (10/10/22 1543)   clevidipine 2 mg/hr (10/11/22 0736)   PRN Meds: acetaminophen **OR** acetaminophen, albuterol, hydrALAZINE, HYDROcodone-acetaminophen, HYDROmorphone (DILAUDID) injection, hydrOXYzine, labetalol, melatonin, midazolam, ondansetron **OR** ondansetron (ZOFRAN) IV, polyethylene glycol, promethazine, sodium phosphate   Vital Signs    Vitals:   10/11/22 0645 10/11/22 0700 10/11/22 0730 10/11/22 0800  BP:  (!) 157/82 (!) 167/82   Pulse: 65 69 79   Resp: 17 18 19    Temp:    98.5 F (36.9 C)  TempSrc:    Oral  SpO2: 95% 95% 94%   Weight:      Height:        Intake/Output Summary (Last 24 hours) at 10/11/2022 1057 Last data filed at 10/11/2022 0600 Gross per 24 hour  Intake 2059.72 ml  Output 4125 ml  Net -2065.28 ml   Filed Weights   10/05/22 1759 10/08/22 1222 10/10/22 1022  Weight: 65 kg 65 kg 66 kg    Telemetry    NSR - Personally Reviewed  ECG    NA - Personally Reviewed  Physical Exam   GEN: No  acute distress.   Neck: No  JVD Cardiac: RRR, no murmurs, rubs, or gallops.  Respiratory: Clear   to auscultation bilaterally. GI: Soft, nontender, non-distended, normal bowel sounds  MS:  No edema; No deformity. Neuro:   Nonfocal  Psych: Oriented and appropriate    Labs    Chemistry Recent Labs  Lab  10/07/22 0336 10/08/22 0307 10/09/22 0332 10/10/22 1254 10/11/22 0103  NA 139 138 137 139 137  K 3.7 3.8 3.6 3.2* 3.5  CL 108 105 105  --  104  CO2 25 28 26   --  25  GLUCOSE 159* 136* 136*  --  139*  BUN 17 19 19   --  14  CREATININE 0.96 0.95 1.02*  --  0.85  CALCIUM 9.0 8.6* 8.7*  --  8.4*  PROT 6.1* 5.7* 5.9*  --   --   ALBUMIN 3.3* 3.2* 3.3*  --   --   AST 38 45* 35  --   --   ALT 21 26 28   --   --   ALKPHOS 53 48 53  --   --   BILITOT 0.7 0.7 0.6  --   --   GFRNONAA >60 >60 58*  --  >60  ANIONGAP 6 5 6   --  8     Hematology Recent Labs  Lab 10/08/22 0307 10/09/22 0332 10/10/22 1254 10/11/22 0103  WBC 13.8* 10.1  --  12.4*  RBC 4.28 4.43  --  4.40  HGB 13.0 13.3 12.6 13.7  HCT 39.6 41.0 37.0 40.4  MCV 92.5 92.6  --  91.8  MCH 30.4 30.0  --  31.1  MCHC 32.8 32.4  --  33.9  RDW 12.6 12.5  --  12.6  PLT 293 293  --  276    Cardiac EnzymesNo results for input(s): "TROPONINI" in the last 168 hours. No results for input(s): "TROPIPOC" in the last 168 hours.   BNPNo results for input(s): "BNP", "PROBNP" in the last 168 hours.   DDimer No results for input(s): "DDIMER" in the last 168 hours.   Radiology    ECHOCARDIOGRAM COMPLETE  Result Date: 10/09/2022    ECHOCARDIOGRAM REPORT   Patient Name:   Penny Hicks Date of Exam: 10/09/2022 Medical Rec #:  [id number]         Height:       64.0 in Accession #:    [id number]        Weight:       143.3 lb Date of Birth:  Jul 15, 1950          BSA:          1.698 m Patient Age:    72 years          BP:           181/98 mmHg Patient Gender: F                 HR:           73 bpm. Exam Location:  Inpatient Procedure: 2D Echo, Cardiac Doppler and Color Doppler Indications:    Preop cardiovascular exam  History:        Patient has prior history of Echocardiogram examinations, most                 recent 02/10/2020. CHF; Risk Factors:Hypertension and                 Dyslipidemia.  Sonographer:    Wallie Char Referring Phys: [phone]  [facility] SAMTANI IMPRESSIONS  1. Global hypokinesis with akinesis of the basal inferolateral wall.  2. Left ventricular ejection fraction, by estimation, is 30 to 35%. The left ventricle has moderately decreased function. The left ventricle demonstrates regional wall motion abnormalities (see scoring diagram/findings for description). There is moderate left ventricular hypertrophy. Left ventricular diastolic parameters are consistent with Grade I diastolic dysfunction (impaired relaxation).  3. Right ventricular systolic function is mildly reduced. The right ventricular size is normal.  4. Left atrial size was mildly dilated.  5. The mitral valve is normal in structure. Trivial mitral valve regurgitation. No evidence of mitral stenosis.  6. The aortic valve is tricuspid. Aortic valve regurgitation is not visualized. No aortic stenosis is present.  7. The inferior vena cava is dilated in size with >50% respiratory variability, suggesting right atrial pressure of 8 mmHg. FINDINGS  Left Ventricle: Left ventricular ejection fraction, by estimation, is 30 to 35%. The left ventricle has moderately decreased function. The left ventricle demonstrates regional wall motion abnormalities. The left ventricular internal cavity size was normal in size. There is moderate left ventricular hypertrophy. Left ventricular diastolic parameters are consistent with Grade I diastolic dysfunction (impaired relaxation). Right Ventricle: The right ventricular size is normal. Right ventricular systolic function is mildly reduced. Left Atrium: Left atrial size was mildly dilated. Right Atrium: Right atrial size was normal in size. Pericardium: There is no evidence of pericardial effusion. Mitral Valve: The mitral valve is normal in structure. Trivial mitral valve regurgitation. No evidence of mitral valve stenosis. MV peak gradient, 4.6 mmHg. The mean mitral valve gradient is 2.0 mmHg. Tricuspid Valve: The tricuspid valve is  normal in  structure. Tricuspid valve regurgitation is trivial. No evidence of tricuspid stenosis. Aortic Valve: The aortic valve is tricuspid. Aortic valve regurgitation is not visualized. No aortic stenosis is present. Aortic valve mean gradient measures 4.5 mmHg. Aortic valve peak gradient measures 7.7 mmHg. Aortic valve area, by VTI measures 3.05 cm. Pulmonic Valve: The pulmonic valve was normal in structure. Pulmonic valve regurgitation is trivial. No evidence of pulmonic stenosis. Aorta: The aortic root is normal in size and structure. Venous: The inferior vena cava is dilated in size with greater than 50% respiratory variability, suggesting right atrial pressure of 8 mmHg. IAS/Shunts: No atrial level shunt detected by color flow Doppler. Additional Comments: Global hypokinesis with akinesis of the basal inferolateral wall.  LEFT VENTRICLE PLAX 2D LVIDd:         4.90 cm      Diastology LVIDs:         4.00 cm      LV e' medial:    4.10 cm/s LV PW:         1.30 cm      LV E/e' medial:  13.6 LV IVS:        1.20 cm      LV e' lateral:   7.81 cm/s LVOT diam:     2.30 cm      LV E/e' lateral: 7.1 LV SV:         83 LV SV Index:   49 LVOT Area:     4.15 cm  LV Volumes (MOD) LV vol d, MOD A2C: 114.0 ml LV vol d, MOD A4C: 119.0 ml LV vol s, MOD A2C: 72.4 ml LV vol s, MOD A4C: 73.5 ml LV SV MOD A2C:     41.6 ml LV SV MOD A4C:     119.0 ml LV SV MOD BP:      43.9 ml RIGHT VENTRICLE             IVC RV Basal diam:  3.60 cm     IVC diam: 2.40 cm RV S prime:     15.60 cm/s TAPSE (M-mode): 1.8 cm LEFT ATRIUM             Index        RIGHT ATRIUM           Index LA diam:        4.40 cm 2.59 cm/m   RA Area:     13.30 cm LA Vol (A2C):   59.4 ml 34.98 ml/m  RA Volume:   30.30 ml  17.85 ml/m LA Vol (A4C):   60.3 ml 35.51 ml/m LA Biplane Vol: 63.9 ml 37.63 ml/m  AORTIC VALVE AV Area (Vmax):    3.01 cm AV Area (Vmean):   2.87 cm AV Area (VTI):     3.05 cm AV Vmax:           138.50 cm/s AV Vmean:          99.050 cm/s AV VTI:             0.272 m AV Peak Grad:      7.7 mmHg AV Mean Grad:      4.5 mmHg LVOT Vmax:         100.40 cm/s LVOT Vmean:        68.350 cm/s LVOT VTI:          0.200 m LVOT/AV VTI ratio: 0.73  AORTA Ao Root diam: 3.60 cm Ao Asc diam:  3.70 cm  MITRAL VALVE                TRICUSPID VALVE MV Area (PHT): 2.16 cm     TR Peak grad:   27.5 mmHg MV Area VTI:   2.91 cm     TR Vmax:        262.00 cm/s MV Peak grad:  4.6 mmHg MV Mean grad:  2.0 mmHg     SHUNTS MV Vmax:       1.07 m/s     Systemic VTI:  0.20 m MV Vmean:      56.1 cm/s    Systemic Diam: 2.30 cm MV Decel Time: 352 msec MV E velocity: 55.60 cm/s MV A velocity: 116.00 cm/s MV E/A ratio:  0.48 Olga Millers MD Electronically signed by Olga Millers MD Signature Date/Time: 10/09/2022/12:59:49 PM    Final     Cardiac Studies   Echo:  1. Global hypokinesis with akinesis of the basal inferolateral wall.   2. Left ventricular ejection fraction, by estimation, is 30 to 35%. The  left ventricle has moderately decreased function. The left ventricle  demonstrates regional wall motion abnormalities (see scoring  diagram/findings for description). There is  moderate left ventricular hypertrophy. Left ventricular diastolic  parameters are consistent with Grade I diastolic dysfunction (impaired  relaxation).   3. Right ventricular systolic function is mildly reduced. The right  ventricular size is normal.   4. Left atrial size was mildly dilated.   5. The mitral valve is normal in structure. Trivial mitral valve  regurgitation. No evidence of mitral stenosis.   6. The aortic valve is tricuspid. Aortic valve regurgitation is not  visualized. No aortic stenosis is present.   7. The inferior vena cava is dilated in size with >50% respiratory  variability, suggesting right atrial pressure of 8 mmHg.   Patient Profile     72 y.o. female patient with cerebral mass for excision today.  We saw preoperatively.  She has a history of cardiomyopathy without previous work up  or treatment.    Now status post craniotomy and meningioma resection.    Assessment & Plan    Cardiomyopathy:    Raford Pitcher.  Seems to be euvolemic.    LVH:  Will evaluate as an out patient.    She will need work up for possible familial cardiomyopathy vs infiltrative process.  Suspect this is related to uncontrolled HTN.    Dyslipidemia: Start Lipitor   HTN:  Hypertensive urgency with induction of anesthesia.   Entresto started.     For questions or updates, please contact [facility] Please consult [email or web address] for contact info under Cardiology/STEMI.   Signed, Rollene Rotunda, MD  10/11/2022, 10:57 AM

## 2022-10-11 NOTE — Progress Notes (Signed)
NAME:  Penny Hicks, MRN:  [id number], DOB:  03/19/1951, LOS: 6 ADMISSION DATE:  10/05/2022 CONSULTATION DATE:  10/10/2022 REFERRING MD:  Dawley - NSGY CHIEF COMPLAINT:  Hypertension post-crani   History of Present Illness:  72 year old woman who presented to [facility] 4/25 as a Code Stroke with right-sided weakness and new-onset seizures. 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 PMHx significant for HTN, HLD, COPD, hypothyroidism, OA.  Initial CT Head at [facility] 4/25 demonstrated 4.2cm dural-based lesion along parasagittal L parietal lobe with vasogenic edema and mass effect on the L lateral ventricular system, no ICH. CTA Head/Neck 4/25 showed no LVO/stenosis. Neuro was consulted via telehealth and recommended Keppra initiation and transfer to [facility] for further care/cEEG and MRI. Transferred to [facility] 4/25PM.   MRI Brain 4/26 showed 4.2 x 3.5cm dural-based mass with edema and L -> R shift, no other mass/intracranial abnormality. CT Chest/A/P was completed to r/o metastatic disease and showed age-indeterminate wedge deformities of T6-T7, T12; c/f pathologic fractures with ?underlying metastatic lesions. MRI Brain with/without contrast 4/28 showed 3.6 x 3.7 x 4.1cm extraaxial mass overlying parafalcine L parietal convexity, c/w meningioma.  Patient underwent meningioma resection with NSGY (Dr. Jake Samples) 4/30. EBL . Intraoperative course was grossly uncomplicated with the exception of hypertension to SBP 250s during induction. Extubated after the case. Remained hypertensive with SBP 160s-180s postoperatively requiring Cleviprex gtt initiation.  PCCM consulted for ICU transfer in the setting of hypertension post-crani.  Pertinent Medical History:   Past Medical History:  Diagnosis Date   Arthritis    COPD (chronic obstructive pulmonary disease) (HCC)    Hypercholesterolemia    Hypertension    Hypothyroidism    Significant Hospital Events: Including procedures, antibiotic start and stop dates in addition  to other pertinent events   4/25 - Presented to [facility] for Code Stroke. CT Head/CTA Head/Neck as above. Neuro teleconsult. Transferred to [facility]. 4/29 - Echo with akinesis of basal inferolateral wall, EF 30-35%, +RWMAs, moderate LVH, G1DD, mildly reduced RV function. 4/30 - POD#0 from resection of L parietal meningioma with NSGY (Dawley). EBL . Hypertensive to SBP 250s during induction, remains hypertensive to 180s post-op/after extubation. Required Cleviprex initiation and transfer to 4N.  Interim History / Subjective:  No overnight issues Remain on clevidipine infusion Denies headache  Objective:  Blood pressure (!) 167/82, pulse 79, temperature 98.5 F (36.9 C), temperature source Oral, resp. rate 19, height 5\' 4"  (1.626 m), weight 66 kg, SpO2 94 %.        Intake/Output Summary (Last 24 hours) at 10/11/2022 0913 Last data filed at 10/11/2022 0600 Gross per 24 hour  Intake 2059.72 ml  Output 4125 ml  Net -2065.28 ml   Filed Weights   10/05/22 1759 10/08/22 1222 10/10/22 1022  Weight: 65 kg 65 kg 66 kg   Physical Examination: Physical exam: General: Elderly female, lying on the bed HEENT: S/p craniotomy, eyes anicteric.  moist mucus membranes Neuro: Alert, awake following commands, moving all 4 extremities, no focal neurological deficit noted Chest: Coarse breath sounds, no wheezes or rhonchi Heart: Regular rate and rhythm, no murmurs or gallops Abdomen: Soft, nontender, nondistended, bowel sounds present Skin: No rash  Labs and images were reviewed  Assessment & Plan:  L parietal meningioma S/p L parietal crani and meningioma resection New onset seizures in the setting of above Vasogenic edema with brain compression MRI Brain with/without contrast 4/28 showed 3.6 x 3.7 x 4.1cm extraaxial mass overlying parafalcine L parietal convexity, c/w meningioma. Now s/p meningioma resection Postop care  deferred to neurosurgery Continues Keppra Continue recurrent 2 mg every 6  hours Continue neuro watch  Hypertension urgency New diagnosis of cardiomyopathy with chronic systolic/diastolic heart failure Hypertensive to SBP 250s with induction, subsequently SBP 160s-180s after case/post-extubation. Echo 4/29 with akinesis of basal inferolateral wall, EF 30-35%, +RWMAs, moderate LVH, G1DD, mildly reduced RV function SBP goal is <160 Still on Cleviprex infusion Cardiology is consulted Continue metoprolol 25 mg twice daily Started on Entresto, prior to taper off Cleviprex infusion Will need ischemic/infiltrative cardiac workup  Hypokalemia Now serum potassium is 3.5 Continue supplement and monitor  COPD with emphysema Patient quit smoking 4 years ago Currently on room air Outpatient follow-up with pulmonary  Best Practice: (right click and "Reselect all SmartList Selections" daily)   Diet/type: Advance as tolerated DVT prophylaxis: SCD GI prophylaxis: N/A Lines: Discontinue today Foley: Discontinued today Code Status:  full code Last date of multidisciplinary goals of care discussion [Per primary team]   This patient is critically ill with multiple organ system failure which requires frequent high complexity decision making, assessment, support, evaluation, and titration of therapies. This was completed through the application of advanced monitoring technologies and extensive interpretation of multiple databases.  During this encounter critical care time was devoted to patient care services described in this note for 36 minutes.    Cheri Fowler, MD [facility] See Amion for pager If no response to pager, please call [phone] until 7pm After 7pm, Please call E-link [phone]

## 2022-10-11 NOTE — Progress Notes (Signed)
   Providing Compassionate, Quality Care - Together  NEUROSURGERY PROGRESS NOTE   S: No issues overnight. Doing well  O: EXAM:  BP (!) 167/82   Pulse 79   Temp 98.5 F (36.9 C) (Oral)   Resp 19   Ht 5\' 4"  (1.626 m)   Wt 66 kg   SpO2 94%   BMI 24.98 kg/m   Awake, alert, oriented x3 PERRL Speech fluent, appropriate  CNs grossly intact  5/5 BUE/BLE  Dressing c/d/I Cn 2-12 intact  ASSESSMENT:  72 y.o. female with   Left parafalcine tumor  S/p crani for resection  PLAN: - dc a line, foley -doing well -dc mri as patient would need anesthesia, will order as outpt as needed -cont AED per neurology -ok from my standpoint to stepdown once BP controlled off drips    Thank you for allowing me to participate in this patient's care.  Please do not hesitate to call with questions or concerns.   Monia Pouch, DO Neurosurgeon [facility] Cell: [phone]

## 2022-10-11 NOTE — Telephone Encounter (Signed)
Patient Advocate Encounter  Prior Authorization for Entresto 24-26MG  tablets  has been approved.    PA# [id number] Insurance [employer] Electronic PA Form  Effective dates: 10/11/2022 through 10/11/2023  Patients co-pay is $587.22 due to deductible.     Penny Hicks, CPhT Pharmacy Patient Advocate Specialist [facility] Patient Advocate Team Direct Number: [phone]  Fax: [phone]

## 2022-10-11 NOTE — TOC Benefit Eligibility Note (Addendum)
Patient Product/process development scientist completed.    The patient is currently admitted and upon discharge could be taking Entresto 24-26 mg.  Prior Authorization Required  The patient is insured through [employer]   This test claim was processed through [employer]- copay amounts may vary at other pharmacies due to [employer], or as the patient moves through the different stages of their insurance plan.  Penny Hicks, CPHT Pharmacy Patient Advocate Specialist [facility] Patient Advocate Team Direct Number: [phone]  Fax: [phone]

## 2022-10-12 ENCOUNTER — Inpatient Hospital Stay (HOSPITAL_COMMUNITY): Payer: 59

## 2022-10-12 ENCOUNTER — Other Ambulatory Visit (HOSPITAL_COMMUNITY): Payer: Self-pay

## 2022-10-12 DIAGNOSIS — I429 Cardiomyopathy, unspecified: Secondary | ICD-10-CM | POA: Diagnosis not present

## 2022-10-12 LAB — SURGICAL PATHOLOGY

## 2022-10-12 MED ORDER — SPIRONOLACTONE 25 MG PO TABS
50.0000 mg | ORAL_TABLET | Freq: Every day | ORAL | Status: DC
  Administered 2022-10-12 – 2022-10-14 (×3): 50 mg via ORAL
  Filled 2022-10-12 (×3): qty 2

## 2022-10-12 MED ORDER — DEXAMETHASONE SODIUM PHOSPHATE 4 MG/ML IJ SOLN: 1.0000 mg | Freq: Every day | INTRAMUSCULAR | Status: DC

## 2022-10-12 MED ORDER — DEXAMETHASONE SODIUM PHOSPHATE 4 MG/ML IJ SOLN
2.0000 mg | Freq: Two times a day (BID) | INTRAMUSCULAR | Status: DC
  Administered 2022-10-12 (×2): 2 mg via INTRAVENOUS
  Filled 2022-10-12 (×2): qty 1

## 2022-10-12 MED ORDER — POTASSIUM CHLORIDE CRYS ER 20 MEQ PO TBCR
40.0000 meq | EXTENDED_RELEASE_TABLET | Freq: Once | ORAL | Status: AC
  Administered 2022-10-12: 40 meq via ORAL
  Filled 2022-10-12: qty 2

## 2022-10-12 MED ORDER — DEXAMETHASONE SODIUM PHOSPHATE 4 MG/ML IJ SOLN: 1.0000 mg | Freq: Two times a day (BID) | INTRAMUSCULAR | Status: DC

## 2022-10-12 MED ORDER — DIAZEPAM 5 MG PO TABS
5.0000 mg | ORAL_TABLET | Freq: Once | ORAL | Status: AC
  Administered 2022-10-12: 5 mg via ORAL
  Filled 2022-10-12: qty 1

## 2022-10-12 MED ORDER — GADOBUTROL 1 MMOL/ML IV SOLN
6.0000 mL | Freq: Once | INTRAVENOUS | Status: AC | PRN
  Administered 2022-10-12: 6 mL via INTRAVENOUS

## 2022-10-12 MED ORDER — MAGNESIUM SULFATE 2 GM/50ML IV SOLN
2.0000 g | Freq: Once | INTRAVENOUS | Status: AC
  Administered 2022-10-12: 2 g via INTRAVENOUS
  Filled 2022-10-12: qty 50

## 2022-10-12 MED ORDER — METOPROLOL TARTRATE 25 MG PO TABS
25.0000 mg | ORAL_TABLET | Freq: Four times a day (QID) | ORAL | Status: DC
  Administered 2022-10-12 – 2022-10-14 (×7): 25 mg via ORAL
  Filled 2022-10-12 (×8): qty 1

## 2022-10-12 NOTE — Progress Notes (Signed)
    Providing Compassionate, Quality Care - Together   NEUROSURGERY PROGRESS NOTE     S: No issues overnight. Doing well   O: EXAM:  BP (!) 153/96   Pulse 72   Temp 98.6 F (37 C) (Oral)   Resp (!) 22   Ht 5\' 4"  (1.626 m)   Wt 66 kg   SpO2 94%   BMI 24.98 kg/m     Awake, alert, oriented x3 PERRL Speech fluent, appropriate  5/5 BUE/BLE  Dressing c/d/I Cn 2-12 grossly intact   ASSESSMENT:  72 y.o. female with    Left parafalcine tumor   S/p crani for resection    PLAN: -doing well -postop mri ordered, patient believes she can tolerate w/ PO valium -cont AED per neurology -plan for stepdown today as BP is controlled off drips  Patrici Ranks, [facility]

## 2022-10-12 NOTE — Progress Notes (Signed)
Progress Note  Patient Name: Penny Hicks Date of Encounter: 10/12/2022  Primary Cardiologist:   None   Subjective   No pain.  No SOB.    Inpatient Medications    Scheduled Meds:  atorvastatin  20 mg Oral Daily   Chlorhexidine Gluconate Cloth  6 each Topical Daily   dexamethasone (DECADRON) injection  2 mg Intravenous Q6H   docusate sodium  100 mg Oral BID   levETIRAcetam  500 mg Oral BID   metoprolol tartrate  25 mg Oral BID   mupirocin ointment  1 Application Nasal BID   pantoprazole  40 mg Oral QHS   potassium chloride  40 mEq Oral Once   sacubitril-valsartan  1 tablet Oral BID   spironolactone  50 mg Oral Daily   Continuous Infusions:  sodium chloride Stopped (10/10/22 1543)   magnesium sulfate bolus IVPB     PRN Meds: acetaminophen **OR** acetaminophen, albuterol, hydrALAZINE, HYDROcodone-acetaminophen, HYDROmorphone (DILAUDID) injection, hydrOXYzine, melatonin, ondansetron **OR** ondansetron (ZOFRAN) IV, polyethylene glycol, promethazine, sodium phosphate   Vital Signs    Vitals:   10/12/22 0400 10/12/22 0500 10/12/22 0600 10/12/22 0700  BP: (!) 153/100 (!) 160/91 (!) 154/103 (!) 153/96  Pulse: 62 64 62 72  Resp: 18 (!) 24 19 (!) 22  Temp: 98.6 F (37 C)     TempSrc: Oral     SpO2: 93% 92% 91% 94%  Weight:      Height:        Intake/Output Summary (Last 24 hours) at 10/12/2022 0951 Last data filed at 10/11/2022 1200 Gross per 24 hour  Intake 71.12 ml  Output 400 ml  Net -328.88 ml   Filed Weights   10/05/22 1759 10/08/22 1222 10/10/22 1022  Weight: 65 kg 65 kg 66 kg    Telemetry    NSR, rare PVCs - Personally Reviewed  ECG    NA - Personally Reviewed  Physical Exam   GEN: No  acute distress.   Neck: No  JVD Cardiac: RRR, no murmurs, rubs, or gallops.  Respiratory: Clear   to auscultation bilaterally. GI: Soft, nontender, non-distended, normal bowel sounds  MS:  No edema; No deformity. Neuro:   Nonfocal  Psych: Oriented and  appropriate    Labs    Chemistry Recent Labs  Lab 10/07/22 0336 10/08/22 0307 10/09/22 0332 10/10/22 1254 10/11/22 0103  NA 139 138 137 139 137  K 3.7 3.8 3.6 3.2* 3.5  CL 108 105 105  --  104  CO2 25 28 26   --  25  GLUCOSE 159* 136* 136*  --  139*  BUN 17 19 19   --  14  CREATININE 0.96 0.95 1.02*  --  0.85  CALCIUM 9.0 8.6* 8.7*  --  8.4*  PROT 6.1* 5.7* 5.9*  --   --   ALBUMIN 3.3* 3.2* 3.3*  --   --   AST 38 45* 35  --   --   ALT 21 26 28   --   --   ALKPHOS 53 48 53  --   --   BILITOT 0.7 0.7 0.6  --   --   GFRNONAA >60 >60 58*  --  >60  ANIONGAP 6 5 6   --  8     Hematology Recent Labs  Lab 10/08/22 0307 10/09/22 0332 10/10/22 1254 10/11/22 0103  WBC 13.8* 10.1  --  12.4*  RBC 4.28 4.43  --  4.40  HGB 13.0 13.3 12.6 13.7  HCT 39.6 41.0 37.0  40.4  MCV 92.5 92.6  --  91.8  MCH 30.4 30.0  --  31.1  MCHC 32.8 32.4  --  33.9  RDW 12.6 12.5  --  12.6  PLT 293 293  --  276    Cardiac EnzymesNo results for input(s): "TROPONINI" in the last 168 hours. No results for input(s): "TROPIPOC" in the last 168 hours.   BNPNo results for input(s): "BNP", "PROBNP" in the last 168 hours.   DDimer No results for input(s): "DDIMER" in the last 168 hours.   Radiology    No results found.  Cardiac Studies   Echo:  1. Global hypokinesis with akinesis of the basal inferolateral wall.   2. Left ventricular ejection fraction, by estimation, is 30 to 35%. The  left ventricle has moderately decreased function. The left ventricle  demonstrates regional wall motion abnormalities (see scoring  diagram/findings for description). There is  moderate left ventricular hypertrophy. Left ventricular diastolic  parameters are consistent with Grade I diastolic dysfunction (impaired  relaxation).   3. Right ventricular systolic function is mildly reduced. The right  ventricular size is normal.   4. Left atrial size was mildly dilated.   5. The mitral valve is normal in structure.  Trivial mitral valve  regurgitation. No evidence of mitral stenosis.   6. The aortic valve is tricuspid. Aortic valve regurgitation is not  visualized. No aortic stenosis is present.   7. The inferior vena cava is dilated in size with >50% respiratory  variability, suggesting right atrial pressure of 8 mmHg.   Patient Profile     72 y.o. female patient with cerebral mass for excision today.  We saw preoperatively.  She has a history of cardiomyopathy without previous work up or treatment.    Now status post craniotomy and meningioma resection.    Assessment & Plan    Cardiomyopathy:    Raford Pitcher.  Seems to be euvolemic.    Started spironolactone today.  I will increase the beta blocker today to quid and consolidate dosing before discharge.  If she goes home today please let me know.    LVH:  Will evaluate as an out patient.    She will need work up for possible familial cardiomyopathy vs infiltrative process.  Suspect this is related to uncontrolled HTN.    Dyslipidemia: Started Lipitor   HTN:  This is being managed in the context of treating his CHF   I will arrange follow up.    For questions or updates, please contact [facility] Please consult [email or web address] for contact info under Cardiology/STEMI.   Signed, Rollene Rotunda, MD  10/12/2022, 9:51 AM

## 2022-10-12 NOTE — Plan of Care (Signed)
  Problem: Education: Goal: Knowledge of General Education information will improve Description: Including pain rating scale, medication(s)/side effects and non-pharmacologic comfort measures Outcome: Progressing   Problem: Health Behavior/Discharge Planning: Goal: Ability to manage health-related needs will improve Outcome: Progressing   Problem: Clinical Measurements: Goal: Ability to maintain clinical measurements within normal limits will improve Outcome: Progressing Goal: Will remain free from infection Outcome: Progressing Goal: Diagnostic test results will improve Outcome: Progressing Goal: Respiratory complications will improve Outcome: Progressing Goal: Cardiovascular complication will be avoided Outcome: Progressing   Problem: Activity: Goal: Risk for activity intolerance will decrease Outcome: Progressing   Problem: Nutrition: Goal: Adequate nutrition will be maintained Outcome: Progressing   Problem: Coping: Goal: Level of anxiety will decrease Outcome: Progressing   Problem: Elimination: Goal: Will not experience complications related to bowel motility Outcome: Progressing Goal: Will not experience complications related to urinary retention Outcome: Progressing   Problem: Pain Managment: Goal: General experience of comfort will improve Outcome: Progressing   Problem: Safety: Goal: Ability to remain free from injury will improve Outcome: Progressing   Problem: Skin Integrity: Goal: Risk for impaired skin integrity will decrease Outcome: Progressing   Problem: Education: Goal: Knowledge of disease or condition will improve Outcome: Progressing Goal: Knowledge of secondary prevention will improve (MUST DOCUMENT ALL) Outcome: Progressing Goal: Knowledge of patient specific risk factors will improve Loraine Leriche N/A or DELETE if not current risk factor) Outcome: Progressing   Problem: Ischemic Stroke/TIA Tissue Perfusion: Goal: Complications of ischemic  stroke/TIA will be minimized Outcome: Progressing   Problem: Coping: Goal: Will verbalize positive feelings about self Outcome: Progressing Goal: Will identify appropriate support needs Outcome: Progressing   Problem: Self-Care: Goal: Ability to participate in self-care as condition permits will improve Outcome: Progressing   Problem: Education: Goal: Knowledge of the prescribed therapeutic regimen will improve Outcome: Progressing   Problem: Clinical Measurements: Goal: Usual level of consciousness will be regained or maintained. Outcome: Progressing Goal: Neurologic status will improve Outcome: Progressing Goal: Ability to maintain intracranial pressure will improve Outcome: Progressing   Problem: Skin Integrity: Goal: Demonstration of wound healing without infection will improve Outcome: Progressing

## 2022-10-12 NOTE — Progress Notes (Signed)
NAME:  Penny Hicks, MRN:  [id number], DOB:  07-21-1950, LOS: 7 ADMISSION DATE:  10/05/2022 CONSULTATION DATE:  10/10/2022 REFERRING MD:  Dawley - NSGY CHIEF COMPLAINT:  Hypertension post-crani   History of Present Illness:  72 year old woman who presented to [facility] 4/25 as a Code Stroke with right-sided weakness and new-onset seizures. 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 PMHx significant for HTN, HLD, COPD, hypothyroidism, OA.  Initial CT Head at [facility] 4/25 demonstrated 4.2cm dural-based lesion along parasagittal L parietal lobe with vasogenic edema and mass effect on the L lateral ventricular system, no ICH. CTA Head/Neck 4/25 showed no LVO/stenosis. Neuro was consulted via telehealth and recommended Keppra initiation and transfer to [facility] for further care/cEEG and MRI. Transferred to [facility] 4/25PM.   MRI Brain 4/26 showed 4.2 x 3.5cm dural-based mass with edema and L -> R shift, no other mass/intracranial abnormality. CT Chest/A/P was completed to r/o metastatic disease and showed age-indeterminate wedge deformities of T6-T7, T12; c/f pathologic fractures with ?underlying metastatic lesions. MRI Brain with/without contrast 4/28 showed 3.6 x 3.7 x 4.1cm extraaxial mass overlying parafalcine L parietal convexity, c/w meningioma.  Patient underwent meningioma resection with NSGY (Dr. Jake Samples) 4/30. EBL . Intraoperative course was grossly uncomplicated with the exception of hypertension to SBP 250s during induction. Extubated after the case. Remained hypertensive with SBP 160s-180s postoperatively requiring Cleviprex gtt initiation.  PCCM consulted for ICU transfer in the setting of hypertension post-crani.  Pertinent Medical History:   Past Medical History:  Diagnosis Date   Arthritis    COPD (chronic obstructive pulmonary disease) (HCC)    Hypercholesterolemia    Hypertension    Hypothyroidism    Significant Hospital Events: Including procedures, antibiotic start and stop dates in addition  to other pertinent events   4/25 - Presented to [facility] for Code Stroke. CT Head/CTA Head/Neck as above. Neuro teleconsult. Transferred to [facility]. 4/29 - Echo with akinesis of basal inferolateral wall, EF 30-35%, +RWMAs, moderate LVH, G1DD, mildly reduced RV function. 4/30 - POD#0 from resection of L parietal meningioma with NSGY (Dawley). EBL . Hypertensive to SBP 250s during induction, remains hypertensive to 180s post-op/after extubation. Required Cleviprex initiation and transfer to 4N.  Interim History / Subjective:  Patient stated could not sleep overnight due to noise and uncomfortable bed Denies any complaint Remain afebrile  Objective:  Blood pressure (!) 153/96, pulse 72, temperature 98.6 F (37 C), temperature source Oral, resp. rate (!) 22, height 5\' 4"  (1.626 m), weight 66 kg, SpO2 94 %.        Intake/Output Summary (Last 24 hours) at 10/12/2022 0836 Last data filed at 10/11/2022 1200 Gross per 24 hour  Intake 71.12 ml  Output 400 ml  Net -328.88 ml   Filed Weights   10/05/22 1759 10/08/22 1222 10/10/22 1022  Weight: 65 kg 65 kg 66 kg   Physical Examination: General: Elderly female, lying on the bed HEENT: S/p crani, eyes anicteric.  moist mucus membranes Neuro: Alert, awake following commands Chest: Coarse breath sounds, no wheezes or rhonchi Heart: Regular rate and rhythm, no murmurs or gallops Abdomen: Soft, nontender, nondistended, bowel sounds present Skin: No rash  Labs and images were reviewed  Assessment & Plan:  L parietal meningioma S/p L parietal crani and meningioma resection New onset seizures in the setting of above Vasogenic edema with brain compression MRI Brain with/without contrast 4/28 showed 3.6 x 3.7 x 4.1cm extraaxial mass overlying parafalcine L parietal convexity, c/w meningioma. Now s/p meningioma resection Postop care deferred to  neurosurgery Continues Keppra EEG has been discontinued Continue dexamethasone 2 mg every 6 hours Continue  neuro watch  Hypertension urgency New diagnosis of cardiomyopathy with chronic systolic/diastolic heart failure Hypertensive to SBP 250s with induction, subsequently SBP 160s-180s after case/post-extubation. Echo 4/29 with akinesis of basal inferolateral wall, EF 30-35%, +RWMAs, moderate LVH, G1DD, mildly reduced RV function SBP goal is <160 Blood pressure is better controlled Continue metoprolol and Entresto Started on spironolactone Cardiology is following Patient will need outpatient workup for ischemic/infiltrative cardiac disease  Hypokalemia, corrected Closely with electrolytes and supplement  COPD with emphysema Patient quit smoking 4 years ago Currently on room air Outpatient follow-up with pulmonary  Best Practice: (right click and "Reselect all SmartList Selections" daily)   Diet/type: Advance as tolerated DVT prophylaxis: SCD GI prophylaxis: N/A Lines: N/A Foley: N/A Code Status:  full code Last date of multidisciplinary goals of care discussion [Per primary team]    Cheri Fowler, MD [facility] See Amion for pager If no response to pager, please call [phone] until 7pm After 7pm, Please call E-link [phone]

## 2022-10-13 DIAGNOSIS — I429 Cardiomyopathy, unspecified: Secondary | ICD-10-CM | POA: Diagnosis not present

## 2022-10-13 LAB — CBC WITH DIFFERENTIAL/PLATELET
Abs Immature Granulocytes: 0.1 10*3/uL — ABNORMAL HIGH (ref 0.00–0.07)
Basophils Absolute: 0 10*3/uL (ref 0.0–0.1)
Basophils Relative: 0 %
Eosinophils Absolute: 0.1 10*3/uL (ref 0.0–0.5)
Eosinophils Relative: 0 %
HCT: 45.5 % (ref 36.0–46.0)
Hemoglobin: 14.9 g/dL (ref 12.0–15.0)
Immature Granulocytes: 1 %
Lymphocytes Relative: 15 %
Lymphs Abs: 2.1 10*3/uL (ref 0.7–4.0)
MCH: 30.5 pg (ref 26.0–34.0)
MCHC: 32.7 g/dL (ref 30.0–36.0)
MCV: 93.2 fL (ref 80.0–100.0)
Monocytes Absolute: 0.9 10*3/uL (ref 0.1–1.0)
Monocytes Relative: 7 %
Neutro Abs: 10.4 10*3/uL — ABNORMAL HIGH (ref 1.7–7.7)
Neutrophils Relative %: 77 %
Platelets: 286 10*3/uL (ref 150–400)
RBC: 4.88 MIL/uL (ref 3.87–5.11)
RDW: 12.6 % (ref 11.5–15.5)
WBC: 13.5 10*3/uL — ABNORMAL HIGH (ref 4.0–10.5)
nRBC: 0 % (ref 0.0–0.2)

## 2022-10-13 LAB — BASIC METABOLIC PANEL
Anion gap: 7 (ref 5–15)
BUN: 15 mg/dL (ref 8–23)
CO2: 30 mmol/L (ref 22–32)
Calcium: 8.9 mg/dL (ref 8.9–10.3)
Chloride: 102 mmol/L (ref 98–111)
Creatinine, Ser: 0.78 mg/dL (ref 0.44–1.00)
GFR, Estimated: >60 mL/min (ref >60)
Glucose, Bld: 104 mg/dL — ABNORMAL HIGH (ref 70–99)
Potassium: 4.3 mmol/L (ref 3.5–5.1)
Sodium: 139 mmol/L (ref 135–145)

## 2022-10-13 LAB — PHOSPHORUS: Phosphorus: 2.8 mg/dL (ref 2.5–4.6)

## 2022-10-13 LAB — MAGNESIUM: Magnesium: 1.8 mg/dL (ref 1.7–2.4)

## 2022-10-13 MED ORDER — DEXAMETHASONE 2 MG PO TABS
2.0000 mg | ORAL_TABLET | Freq: Two times a day (BID) | ORAL | Status: AC
  Administered 2022-10-13 (×2): 2 mg via ORAL
  Filled 2022-10-13 (×2): qty 1

## 2022-10-13 MED ORDER — DEXAMETHASONE 2 MG PO TABS: 1.0000 mg | ORAL_TABLET | Freq: Every day | ORAL | Status: DC

## 2022-10-13 MED ORDER — DEXAMETHASONE 2 MG PO TABS
1.0000 mg | ORAL_TABLET | Freq: Two times a day (BID) | ORAL | Status: DC
  Administered 2022-10-14: 1 mg via ORAL
  Filled 2022-10-13 (×2): qty 1

## 2022-10-13 MED ORDER — SACUBITRIL-VALSARTAN 97-103 MG PO TABS
1.0000 | ORAL_TABLET | Freq: Two times a day (BID) | ORAL | Status: DC
  Administered 2022-10-13 – 2022-10-14 (×3): 1 via ORAL
  Filled 2022-10-13 (×3): qty 1

## 2022-10-13 NOTE — Progress Notes (Addendum)
Subjective: NAEs o/n  Objective: Vital signs in last 24 hours: Temp:  [98.4 F (36.9 C)-98.7 F (37.1 C)] 98.6 F (37 C) (05/03 0720) Pulse Rate:  [59-87] 64 (05/03 0720) Resp:  [16-22] 18 (05/03 0720) BP: (119-157)/(76-129) 157/101 (05/03 0720) SpO2:  [90 %-98 %] 98 % (05/03 0720)  Intake/Output from previous day: 05/02 0701 - 05/03 0700 In: 50.1 [IV Piggyback:50.1] Out: -  Intake/Output this shift: No intake/output data recorded.  NAD A+Ox3 Dressing removed, incision c/d/i  Lab Results: Recent Labs    10/11/22 0103 10/13/22 0732  WBC 12.4* 13.5*  HGB 13.7 14.9  HCT 40.4 45.5  PLT 276 286   BMET Recent Labs    10/11/22 0103 10/13/22 0732  NA 137 139  K 3.5 4.3  CL 104 102  CO2 25 30  GLUCOSE 139* 104*  BUN 14 15  CREATININE 0.85 0.78  CALCIUM 8.4* 8.9    Studies/Results: MR BRAIN W WO CONTRAST  Result Date: 10/12/2022 CLINICAL DATA:  Meningioma could EXAM: MRI HEAD WITHOUT AND WITH CONTRAST TECHNIQUE: Multiplanar, multiecho pulse sequences of the brain and surrounding structures were obtained without and with intravenous contrast. CONTRAST:  6mL GADAVIST GADOBUTROL 1 MMOL/ML IV SOLN COMPARISON:  MRI of the brain October 08, 2022. FINDINGS: Brain: Postsurgical changes from left parietal parafalcine dural-based extra-axial tumor resection. Surgical cavity is seen in the medial left parietal region containing blood products. Restricted diffusion is seen within the brain parenchyma surrounding the surgical cavity consistent with postsurgical ischemia. Persistent vasogenic edema surrounding the surgical cavity, slightly less evident than on prior MRI. On postcontrast images, no evidence of residual tumor is noted. Small fluid collection is seen subjacent to the craniotomy with blood products measuring up to 7 mm in thickness in causing anterior displacement of the superior sagittal sinus which appear preserved. Mild diffuse dural thickening and enhancement, may be  related to postsurgical reduced intracranial pressure. Scattered and confluent foci of T2 hyperintensity are seen within the white matter of the cerebral hemispheres, nonspecific, most likely related to chronic small vessel ischemia. No hydrocephalus or acute territorial infarct. Vascular: Normal flow voids. Skull and upper cervical spine: Postsurgical changes from parietal craniotomy. No focal marrow lesion identified. Sinuses/Orbits: Bilateral mastoid effusion. Paranasal sinuses are clear. The orbits are maintained. Other: None. IMPRESSION: 1. Postsurgical changes from left parietal parafalcine dural-based extra-axial tumor resection. No evidence of residual tumor. 2. Small fluid collection subjacent to the craniotomy with blood products measuring up to 7 mm in thickness causing anterior displacement of the superior sagittal sinus which appear preserved. 3. Mild diffuse dural thickening and enhancement, may be related to postsurgical reduced intracranial pressure. 4. Moderate chronic microvascular ischemic changes of the white matter. Electronically Signed   By: Baldemar Lenis M.D.   On: 10/12/2022 15:09    Assessment/Plan: S/p meningioma resection  LOS: 8 days  - postop MRI reviewed-- shows expected postop changes - AEDs per neurology - taper dexamethasone over 1 week - f/u with Dr. Jake Samples in 10 days   Penny Hicks 10/13/2022, 9:21 AM

## 2022-10-13 NOTE — Progress Notes (Signed)
Progress Note  Patient Name: Penny Hicks Date of Encounter: 10/13/2022  Primary Cardiologist:   Rollene Rotunda, MD   Subjective   No chest pain, SOB or headache.    Inpatient Medications    Scheduled Meds:  atorvastatin  20 mg Oral Daily   Chlorhexidine Gluconate Cloth  6 each Topical Daily   dexamethasone (DECADRON) injection  2 mg Intravenous Q12H   Followed by   Melene Muller ON 10/14/2022] dexamethasone (DECADRON) injection  1 mg Intravenous Q12H   Followed by   Melene Muller ON 10/16/2022] dexamethasone (DECADRON) injection  1 mg Intravenous Daily   docusate sodium  100 mg Oral BID   levETIRAcetam  500 mg Oral BID   metoprolol tartrate  25 mg Oral QID   mupirocin ointment  1 Application Nasal BID   pantoprazole  40 mg Oral QHS   sacubitril-valsartan  1 tablet Oral BID   spironolactone  50 mg Oral Daily   Continuous Infusions:  sodium chloride Stopped (10/10/22 1543)   PRN Meds: acetaminophen **OR** acetaminophen, albuterol, hydrALAZINE, HYDROcodone-acetaminophen, HYDROmorphone (DILAUDID) injection, hydrOXYzine, melatonin, ondansetron **OR** ondansetron (ZOFRAN) IV, polyethylene glycol, promethazine, sodium phosphate   Vital Signs    Vitals:   10/13/22 0043 10/13/22 0421 10/13/22 0644 10/13/22 0720  BP: (!) 147/76 (!) 149/94 136/89 (!) 157/101  Pulse: (!) 59 71 78 64  Resp: 16 18  18   Temp: 98.7 F (37.1 C) 98.4 F (36.9 C)  98.6 F (37 C)  TempSrc: Oral Oral    SpO2: 95% 94%  98%  Weight:      Height:        Intake/Output Summary (Last 24 hours) at 10/13/2022 0759 Last data filed at 10/12/2022 1200 Gross per 24 hour  Intake 50.07 ml  Output --  Net 50.07 ml   Filed Weights   10/05/22 1759 10/08/22 1222 10/10/22 1022  Weight: 65 kg 65 kg 66 kg    Telemetry    NA - Personally Reviewed  ECG    NA - Personally Reviewed  Physical Exam   GEN: No  acute distress.   Neck: No  JVD Cardiac: RRR, no murmurs, rubs, or gallops.  Respiratory: Clear   to  auscultation bilaterally. GI: Soft, nontender, non-distended, normal bowel sounds  MS:  No edema; No deformity. Neuro:   Nonfocal  Psych: Oriented and appropriate     Labs    Chemistry Recent Labs  Lab 10/07/22 0336 10/08/22 0307 10/09/22 0332 10/10/22 1254 10/11/22 0103  NA 139 138 137 139 137  K 3.7 3.8 3.6 3.2* 3.5  CL 108 105 105  --  104  CO2 25 28 26   --  25  GLUCOSE 159* 136* 136*  --  139*  BUN 17 19 19   --  14  CREATININE 0.96 0.95 1.02*  --  0.85  CALCIUM 9.0 8.6* 8.7*  --  8.4*  PROT 6.1* 5.7* 5.9*  --   --   ALBUMIN 3.3* 3.2* 3.3*  --   --   AST 38 45* 35  --   --   ALT 21 26 28   --   --   ALKPHOS 53 48 53  --   --   BILITOT 0.7 0.7 0.6  --   --   GFRNONAA >60 >60 58*  --  >60  ANIONGAP 6 5 6   --  8     Hematology Recent Labs  Lab 10/08/22 0307 10/09/22 0332 10/10/22 1254 10/11/22 0103  WBC 13.8* 10.1  --  12.4*  RBC 4.28 4.43  --  4.40  HGB 13.0 13.3 12.6 13.7  HCT 39.6 41.0 37.0 40.4  MCV 92.5 92.6  --  91.8  MCH 30.4 30.0  --  31.1  MCHC 32.8 32.4  --  33.9  RDW 12.6 12.5  --  12.6  PLT 293 293  --  276    Cardiac EnzymesNo results for input(s): "TROPONINI" in the last 168 hours. No results for input(s): "TROPIPOC" in the last 168 hours.   BNPNo results for input(s): "BNP", "PROBNP" in the last 168 hours.   DDimer No results for input(s): "DDIMER" in the last 168 hours.   Radiology    MR BRAIN W WO CONTRAST  Result Date: 10/12/2022 CLINICAL DATA:  Meningioma could EXAM: MRI HEAD WITHOUT AND WITH CONTRAST TECHNIQUE: Multiplanar, multiecho pulse sequences of the brain and surrounding structures were obtained without and with intravenous contrast. CONTRAST:  6mL GADAVIST GADOBUTROL 1 MMOL/ML IV SOLN COMPARISON:  MRI of the brain October 08, 2022. FINDINGS: Brain: Postsurgical changes from left parietal parafalcine dural-based extra-axial tumor resection. Surgical cavity is seen in the medial left parietal region containing blood products.  Restricted diffusion is seen within the brain parenchyma surrounding the surgical cavity consistent with postsurgical ischemia. Persistent vasogenic edema surrounding the surgical cavity, slightly less evident than on prior MRI. On postcontrast images, no evidence of residual tumor is noted. Small fluid collection is seen subjacent to the craniotomy with blood products measuring up to 7 mm in thickness in causing anterior displacement of the superior sagittal sinus which appear preserved. Mild diffuse dural thickening and enhancement, may be related to postsurgical reduced intracranial pressure. Scattered and confluent foci of T2 hyperintensity are seen within the white matter of the cerebral hemispheres, nonspecific, most likely related to chronic small vessel ischemia. No hydrocephalus or acute territorial infarct. Vascular: Normal flow voids. Skull and upper cervical spine: Postsurgical changes from parietal craniotomy. No focal marrow lesion identified. Sinuses/Orbits: Bilateral mastoid effusion. Paranasal sinuses are clear. The orbits are maintained. Other: None. IMPRESSION: 1. Postsurgical changes from left parietal parafalcine dural-based extra-axial tumor resection. No evidence of residual tumor. 2. Small fluid collection subjacent to the craniotomy with blood products measuring up to 7 mm in thickness causing anterior displacement of the superior sagittal sinus which appear preserved. 3. Mild diffuse dural thickening and enhancement, may be related to postsurgical reduced intracranial pressure. 4. Moderate chronic microvascular ischemic changes of the white matter. Electronically Signed   By: Baldemar Lenis M.D.   On: 10/12/2022 15:09    Cardiac Studies   Echo:  1. Global hypokinesis with akinesis of the basal inferolateral wall.   2. Left ventricular ejection fraction, by estimation, is 30 to 35%. The  left ventricle has moderately decreased function. The left ventricle   demonstrates regional wall motion abnormalities (see scoring  diagram/findings for description). There is  moderate left ventricular hypertrophy. Left ventricular diastolic  parameters are consistent with Grade I diastolic dysfunction (impaired  relaxation).   3. Right ventricular systolic function is mildly reduced. The right  ventricular size is normal.   4. Left atrial size was mildly dilated.   5. The mitral valve is normal in structure. Trivial mitral valve  regurgitation. No evidence of mitral stenosis.   6. The aortic valve is tricuspid. Aortic valve regurgitation is not  visualized. No aortic stenosis is present.   7. The inferior vena cava is dilated in size with >50% respiratory  variability, suggesting right atrial  pressure of 8 mmHg.   Patient Profile     72 y.o. female patient with cerebral mass for excision today.  We saw preoperatively.  She has a history of cardiomyopathy without previous work up or treatment.    Now status post craniotomy and meningioma resection.    Assessment & Plan    Cardiomyopathy:    Increase Entresto.  Seems to be euvolemic.  Other meds as listed.   LVH:  Will evaluate as an out patient.    Suspect this is related to uncontrolled HTN.  She will need work up for possible familial cardiomyopathy vs infiltrative process.    Dyslipidemia: Started Lipitor this admission.   HTN:  This is being managed in the context of treating his CHF .  I will increase the Entresto today.    Follow up with me has been arranged for next Friday.    For questions or updates, please contact [facility] Please consult [email or web address] for contact info under Cardiology/STEMI.   Signed, Rollene Rotunda, MD  10/13/2022, 7:59 AM

## 2022-10-13 NOTE — TOC Progression Note (Signed)
Transition of Care [facility]) - Progression Note    Patient Details  Name: Penny Hicks MRN: [id number] Date of Birth: September 25, 1950  [facility] CM/SW Contact  Kermit Balo, RN Phone Number: 10/13/2022, 1:04 PM  Clinical Narrative:    Pt is s/p crani.  TOC following for d/c needs.    Expected Discharge Plan: Home/Self Care    Expected Discharge Plan and Services                                               Social Determinants of Health (SDOH) Interventions SDOH Screenings   Food Insecurity: No Food Insecurity (10/06/2022)  Housing: Low Risk  (10/06/2022)  Transportation Needs: No Transportation Needs (10/06/2022)  Utilities: Not At Risk (10/06/2022)  Tobacco Use: Medium Risk (10/11/2022)    Readmission Risk Interventions     No data to display

## 2022-10-14 MED ORDER — SACUBITRIL-VALSARTAN 97-103 MG PO TABS: 1.0000 | ORAL_TABLET | Freq: Two times a day (BID) | ORAL | 0 refills | Status: DC

## 2022-10-14 MED ORDER — DEXAMETHASONE 1 MG PO TABS: 1.0000 mg | ORAL_TABLET | Freq: Two times a day (BID) | ORAL | 0 refills | Status: DC

## 2022-10-14 MED ORDER — TRAMADOL HCL 50 MG PO TABS: 50.0000 mg | ORAL_TABLET | Freq: Four times a day (QID) | ORAL | 0 refills | Status: AC | PRN

## 2022-10-14 MED ORDER — DEXAMETHASONE 1 MG PO TABS: 1.0000 mg | ORAL_TABLET | Freq: Every day | ORAL | 0 refills | Status: DC

## 2022-10-14 MED ORDER — ATORVASTATIN CALCIUM 20 MG PO TABS: 20.0000 mg | ORAL_TABLET | Freq: Every day | ORAL | 0 refills | Status: DC

## 2022-10-14 MED ORDER — LEVETIRACETAM 500 MG PO TABS: 500.0000 mg | ORAL_TABLET | Freq: Two times a day (BID) | ORAL | 0 refills | Status: DC

## 2022-10-14 MED ORDER — METOPROLOL TARTRATE 25 MG PO TABS: 25.0000 mg | ORAL_TABLET | Freq: Four times a day (QID) | ORAL | 0 refills | Status: DC

## 2022-10-14 NOTE — TOC Transition Note (Signed)
Transition of Care [facility]) - CM/SW Discharge Note   Patient Details  Name: Penny Hicks MRN: [id number] Date of Birth: 01/15/1951  [facility] of [facility]) CM/SW Contact:  Lawerance Sabal, RN Phone Number: 10/14/2022, 9:24 AM   Clinical Narrative:     Sherron Monday w patient and provided her 17 day Entresto coupon        Patient Goals and CMS Choice      Discharge Placement                         Discharge Plan and Services Additional resources added to the After Visit Summary for                                       Social Determinants of Health (SDOH) Interventions SDOH Screenings   Food Insecurity: No Food Insecurity (10/06/2022)  Housing: Low Risk  (10/06/2022)  Transportation Needs: No Transportation Needs (10/06/2022)  Utilities: Not At Risk (10/06/2022)  Tobacco Use: Medium Risk (10/11/2022)     Readmission Risk Interventions     No data to display

## 2022-10-14 NOTE — Discharge Summary (Addendum)
Physician Discharge Summary  Patient ID: Penny Hicks MRN: [id number] DOB/AGE: 09/16/1950 72 y.o.  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 The patient tolerated the procedure well and was taken to the recovery room and then to the ICU in stable condition. The hospital course was routine. There were no complications. The wound remained clean dry and intact. Pt had appropriate head soreness.  The patient remained afebrile with stable vital signs, and tolerated a regular diet. The patient continued to increase activities, and pain was well controlled with oral pain medications.   Consults: cardiology  Significant Diagnostic Studies:  Results for orders placed or performed during the hospital encounter of 10/05/22  Surgical PCR screen   Specimen: Nasal Mucosa; Nasal Swab  Result Value Ref Range   MRSA, PCR NEGATIVE NEGATIVE   Staphylococcus aureus POSITIVE (A) NEGATIVE  Ethanol  Result Value Ref Range   Alcohol, Ethyl (B) <10 <10 mg/dL  Protime-INR  Result Value Ref Range   Prothrombin Time 13.0 11.4 - 15.2 seconds   INR 1.0 0.8 - 1.2  APTT  Result Value Ref Range   aPTT 21 (L) 24 - 36 seconds  CBC  Result Value Ref Range   WBC 8.3 4.0 - 10.5 K/uL   RBC 4.80 3.87 - 5.11 MIL/uL   Hemoglobin 14.6 12.0 - 15.0 g/dL   HCT 19.1 47.8 - 29.5 %   MCV 92.3 80.0 - 100.0 fL   MCH 30.4 26.0 - 34.0 pg   MCHC 33.0 30.0 - 36.0 g/dL   RDW 62.1 30.8 - 65.7 %   Platelets 313 150 - 400 K/uL   nRBC 0.0 0.0 - 0.2 %  Differential  Result Value Ref Range   Neutrophils Relative % 77 %   Neutro Abs 6.4 1.7 - 7.7 K/uL   Lymphocytes Relative 17 %   Lymphs Abs 1.4 0.7 - 4.0 K/uL   Monocytes  Relative 4 %   Monocytes Absolute 0.3 0.1 - 1.0 K/uL   Eosinophils Relative 0 %   Eosinophils Absolute 0.0 0.0 - 0.5 K/uL   Basophils Relative 1 %   Basophils Absolute 0.0 0.0 - 0.1 K/uL   Immature Granulocytes 1 %   Abs Immature Granulocytes 0.10 (H) 0.00 - 0.07 K/uL  Comprehensive metabolic panel  Result Value Ref Range   Sodium 136 135 - 145 mmol/L   Potassium 3.4 (L) 3.5 - 5.1 mmol/L   Chloride 101 98 - 111 mmol/L   CO2 20 (L) 22 - 32 mmol/L   Glucose, Bld 159 (H) 70 - 99 mg/dL   BUN 18 8 - 23 mg/dL   Creatinine, Ser 8.46 (H) 0.44 - 1.00 mg/dL   Calcium 9.3 8.9 - 96.2 mg/dL   Total Protein 7.3 6.5 - 8.1 g/dL   Albumin 3.9 3.5 - 5.0 g/dL   AST 27 15 - 41 U/L   ALT 16 0 - 44 U/L   Alkaline Phosphatase 64 38 - 126 U/L   Total Bilirubin 0.6 0.3 - 1.2 mg/dL   GFR, Estimated 50 (L) >60 mL/min   Anion gap 15 5 - 15  Urine rapid drug screen (hosp performed)  Result Value Ref Range   Opiates NONE DETECTED NONE DETECTED  Cocaine NONE DETECTED NONE DETECTED   Benzodiazepines NONE DETECTED NONE DETECTED   Amphetamines NONE DETECTED NONE DETECTED   Tetrahydrocannabinol NONE DETECTED NONE DETECTED   Barbiturates NONE DETECTED NONE DETECTED  Urinalysis, Routine w reflex microscopic -Urine, Clean Catch  Result Value Ref Range   Color, Urine YELLOW YELLOW   APPearance HAZY (A) CLEAR   Specific Gravity, Urine 1.041 (H) 1.005 - 1.030   pH 5.0 5.0 - 8.0   Glucose, UA NEGATIVE NEGATIVE mg/dL   Hgb urine dipstick SMALL (A) NEGATIVE   Bilirubin Urine NEGATIVE NEGATIVE   Ketones, ur 5 (A) NEGATIVE mg/dL   Protein, ur NEGATIVE NEGATIVE mg/dL   Nitrite NEGATIVE NEGATIVE   Leukocytes,Ua MODERATE (A) NEGATIVE   RBC / HPF 0-5 0 - 5 RBC/hpf   WBC, UA 0-5 0 - 5 WBC/hpf   Bacteria, UA NONE SEEN NONE SEEN   Squamous Epithelial / HPF 6-10 0 - 5 /HPF   Mucus PRESENT    Triple Phosphate Crystal PRESENT   Magnesium  Result Value Ref Range   Magnesium 1.9 1.7 - 2.4 mg/dL  Basic metabolic panel   Result Value Ref Range   Sodium 139 135 - 145 mmol/L   Potassium 3.6 3.5 - 5.1 mmol/L   Chloride 105 98 - 111 mmol/L   CO2 24 22 - 32 mmol/L   Glucose, Bld 154 (H) 70 - 99 mg/dL   BUN 10 8 - 23 mg/dL   Creatinine, Ser 1.61 0.44 - 1.00 mg/dL   Calcium 8.7 (L) 8.9 - 10.3 mg/dL   GFR, Estimated >09 >60 mL/min   Anion gap 10 5 - 15  CBC  Result Value Ref Range   WBC 9.1 4.0 - 10.5 K/uL   RBC 4.76 3.87 - 5.11 MIL/uL   Hemoglobin 14.3 12.0 - 15.0 g/dL   HCT 45.4 09.8 - 11.9 %   MCV 92.0 80.0 - 100.0 fL   MCH 30.0 26.0 - 34.0 pg   MCHC 32.6 30.0 - 36.0 g/dL   RDW 14.7 82.9 - 56.2 %   Platelets 317 150 - 400 K/uL   nRBC 0.0 0.0 - 0.2 %  Comprehensive metabolic panel  Result Value Ref Range   Sodium 139 135 - 145 mmol/L   Potassium 3.7 3.5 - 5.1 mmol/L   Chloride 108 98 - 111 mmol/L   CO2 25 22 - 32 mmol/L   Glucose, Bld 159 (H) 70 - 99 mg/dL   BUN 17 8 - 23 mg/dL   Creatinine, Ser 1.30 0.44 - 1.00 mg/dL   Calcium 9.0 8.9 - 86.5 mg/dL   Total Protein 6.1 (L) 6.5 - 8.1 g/dL   Albumin 3.3 (L) 3.5 - 5.0 g/dL   AST 38 15 - 41 U/L   ALT 21 0 - 44 U/L   Alkaline Phosphatase 53 38 - 126 U/L   Total Bilirubin 0.7 0.3 - 1.2 mg/dL   GFR, Estimated >78 >46 mL/min   Anion gap 6 5 - 15  CBC  Result Value Ref Range   WBC 16.1 (H) 4.0 - 10.5 K/uL   RBC 4.69 3.87 - 5.11 MIL/uL   Hemoglobin 14.1 12.0 - 15.0 g/dL   HCT 96.2 95.2 - 84.1 %   MCV 91.9 80.0 - 100.0 fL   MCH 30.1 26.0 - 34.0 pg   MCHC 32.7 30.0 - 36.0 g/dL   RDW 32.4 40.1 - 02.7 %   Platelets 324 150 - 400 K/uL   nRBC 0.0 0.0 - 0.2 %  Comprehensive metabolic panel  Result Value Ref Range   Sodium 138 135 - 145 mmol/L   Potassium 3.8 3.5 - 5.1 mmol/L   Chloride 105 98 - 111 mmol/L   CO2 28 22 - 32 mmol/L   Glucose, Bld 136 (H) 70 - 99 mg/dL   BUN 19 8 - 23 mg/dL   Creatinine, Ser 1.47 0.44 - 1.00 mg/dL   Calcium 8.6 (L) 8.9 - 10.3 mg/dL   Total Protein 5.7 (L) 6.5 - 8.1 g/dL   Albumin 3.2 (L) 3.5 - 5.0 g/dL   AST 45  (H) 15 - 41 U/L   ALT 26 0 - 44 U/L   Alkaline Phosphatase 48 38 - 126 U/L   Total Bilirubin 0.7 0.3 - 1.2 mg/dL   GFR, Estimated >82 >95 mL/min   Anion gap 5 5 - 15  CBC  Result Value Ref Range   WBC 13.8 (H) 4.0 - 10.5 K/uL   RBC 4.28 3.87 - 5.11 MIL/uL   Hemoglobin 13.0 12.0 - 15.0 g/dL   HCT 62.1 30.8 - 65.7 %   MCV 92.5 80.0 - 100.0 fL   MCH 30.4 26.0 - 34.0 pg   MCHC 32.8 30.0 - 36.0 g/dL   RDW 84.6 96.2 - 95.2 %   Platelets 293 150 - 400 K/uL   nRBC 0.0 0.0 - 0.2 %  Comprehensive metabolic panel  Result Value Ref Range   Sodium 137 135 - 145 mmol/L   Potassium 3.6 3.5 - 5.1 mmol/L   Chloride 105 98 - 111 mmol/L   CO2 26 22 - 32 mmol/L   Glucose, Bld 136 (H) 70 - 99 mg/dL   BUN 19 8 - 23 mg/dL   Creatinine, Ser 8.41 (H) 0.44 - 1.00 mg/dL   Calcium 8.7 (L) 8.9 - 10.3 mg/dL   Total Protein 5.9 (L) 6.5 - 8.1 g/dL   Albumin 3.3 (L) 3.5 - 5.0 g/dL   AST 35 15 - 41 U/L   ALT 28 0 - 44 U/L   Alkaline Phosphatase 53 38 - 126 U/L   Total Bilirubin 0.6 0.3 - 1.2 mg/dL   GFR, Estimated 58 (L) >60 mL/min   Anion gap 6 5 - 15  CBC  Result Value Ref Range   WBC 10.1 4.0 - 10.5 K/uL   RBC 4.43 3.87 - 5.11 MIL/uL   Hemoglobin 13.3 12.0 - 15.0 g/dL   HCT 32.4 40.1 - 02.7 %   MCV 92.6 80.0 - 100.0 fL   MCH 30.0 26.0 - 34.0 pg   MCHC 32.4 30.0 - 36.0 g/dL   RDW 25.3 66.4 - 40.3 %   Platelets 293 150 - 400 K/uL   nRBC 0.0 0.0 - 0.2 %  CBC  Result Value Ref Range   WBC 12.4 (H) 4.0 - 10.5 K/uL   RBC 4.40 3.87 - 5.11 MIL/uL   Hemoglobin 13.7 12.0 - 15.0 g/dL   HCT 47.4 25.9 - 56.3 %   MCV 91.8 80.0 - 100.0 fL   MCH 31.1 26.0 - 34.0 pg   MCHC 33.9 30.0 - 36.0 g/dL   RDW 87.5 64.3 - 32.9 %   Platelets 276 150 - 400 K/uL   nRBC 0.0 0.0 - 0.2 %  Basic metabolic panel  Result Value Ref Range   Sodium 137 135 - 145 mmol/L   Potassium 3.5 3.5 - 5.1 mmol/L   Chloride 104 98 - 111 mmol/L   CO2 25 22 - 32 mmol/L   Glucose,  Bld 139 (H) 70 - 99 mg/dL   BUN 14 8 - 23 mg/dL    Creatinine, Ser 1.61 0.44 - 1.00 mg/dL   Calcium 8.4 (L) 8.9 - 10.3 mg/dL   GFR, Estimated >09 >60 mL/min   Anion gap 8 5 - 15  Magnesium  Result Value Ref Range   Magnesium 1.7 1.7 - 2.4 mg/dL  Phosphorus  Result Value Ref Range   Phosphorus 3.2 2.5 - 4.6 mg/dL  Protime-INR  Result Value Ref Range   Prothrombin Time 14.4 11.4 - 15.2 seconds   INR 1.1 0.8 - 1.2  CBC with Differential/Platelet  Result Value Ref Range   WBC 13.5 (H) 4.0 - 10.5 K/uL   RBC 4.88 3.87 - 5.11 MIL/uL   Hemoglobin 14.9 12.0 - 15.0 g/dL   HCT 45.4 09.8 - 11.9 %   MCV 93.2 80.0 - 100.0 fL   MCH 30.5 26.0 - 34.0 pg   MCHC 32.7 30.0 - 36.0 g/dL   RDW 14.7 82.9 - 56.2 %   Platelets 286 150 - 400 K/uL   nRBC 0.0 0.0 - 0.2 %   Neutrophils Relative % 77 %   Neutro Abs 10.4 (H) 1.7 - 7.7 K/uL   Lymphocytes Relative 15 %   Lymphs Abs 2.1 0.7 - 4.0 K/uL   Monocytes Relative 7 %   Monocytes Absolute 0.9 0.1 - 1.0 K/uL   Eosinophils Relative 0 %   Eosinophils Absolute 0.1 0.0 - 0.5 K/uL   Basophils Relative 0 %   Basophils Absolute 0.0 0.0 - 0.1 K/uL   Immature Granulocytes 1 %   Abs Immature Granulocytes 0.10 (H) 0.00 - 0.07 K/uL  Basic metabolic panel  Result Value Ref Range   Sodium 139 135 - 145 mmol/L   Potassium 4.3 3.5 - 5.1 mmol/L   Chloride 102 98 - 111 mmol/L   CO2 30 22 - 32 mmol/L   Glucose, Bld 104 (H) 70 - 99 mg/dL   BUN 15 8 - 23 mg/dL   Creatinine, Ser 1.30 0.44 - 1.00 mg/dL   Calcium 8.9 8.9 - 86.5 mg/dL   GFR, Estimated >78 >46 mL/min   Anion gap 7 5 - 15  Magnesium  Result Value Ref Range   Magnesium 1.8 1.7 - 2.4 mg/dL  Phosphorus  Result Value Ref Range   Phosphorus 2.8 2.5 - 4.6 mg/dL  I-stat chem 8, ED  Result Value Ref Range   Sodium 138 135 - 145 mmol/L   Potassium 3.7 3.5 - 5.1 mmol/L   Chloride 102 98 - 111 mmol/L   BUN 17 8 - 23 mg/dL   Creatinine, Ser 9.62 (H) 0.44 - 1.00 mg/dL   Glucose, Bld 952 (H) 70 - 99 mg/dL   Calcium, Ion 8.41 3.24 - 1.40 mmol/L   TCO2 21  (L) 22 - 32 mmol/L   Hemoglobin 15.0 12.0 - 15.0 g/dL   HCT 40.1 02.7 - 25.3 %  I-STAT 7, (LYTES, BLD GAS, ICA, H+H)  Result Value Ref Range   pH, Arterial 7.448 7.35 - 7.45   pCO2 arterial 37.8 32 - 48 mmHg   pO2, Arterial 242 (H) 83 - 108 mmHg   Bicarbonate 26.1 20.0 - 28.0 mmol/L   TCO2 27 22 - 32 mmol/L   O2 Saturation 100 %   Acid-Base Excess 2.0 0.0 - 2.0 mmol/L   Sodium 139 135 - 145 mmol/L   Potassium 3.2 (L) 3.5 - 5.1 mmol/L   Calcium, Ion 1.19 1.15 - 1.40  mmol/L   HCT 37.0 36.0 - 46.0 %   Hemoglobin 12.6 12.0 - 15.0 g/dL   Sample type ARTERIAL   ECHOCARDIOGRAM COMPLETE  Result Value Ref Range   Weight 2,292.78 oz   Height 64.016 in   BP 181/98 mmHg   Single Plane A2C EF 36.5 %   Single Plane A4C EF 38.2 %   Calc EF 37.5 %   S' Lateral 4.00 cm   AR max vel 3.01 cm2   AV Area VTI 3.05 cm2   AV Mean grad 4.5 mmHg   AV Peak grad 7.7 mmHg   Ao pk vel 1.39 m/s   Area-P 1/2 2.16 cm2   AV Area mean vel 2.87 cm2   MV VTI 2.91 cm2   Est EF 30 - 35%   Type and screen [facility] For surgery today.  Result Value Ref Range   ABO/RH(D) A POS    Antibody Screen NEG    Sample Expiration      10/13/2022,2359 Performed at [facility], [address], [place], [place] [address]   Surgical pathology  Result Value Ref Range   SURGICAL PATHOLOGY      SURGICAL PATHOLOGY CASE: [id number] PATIENT: Mickle Mallory Surgical Pathology Report     Clinical History: left brain tumor (cm)     FINAL MICROSCOPIC DIAGNOSIS:  A. 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  Note: Morphologically this is predominantly a fibrous pattern; however psammomatous calcifications are present.  There is no evidence of atypia or increased mitoses.  Brain parenchyma is not present in the specimen to evaluate for glial invasion.    GROSS DESCRIPTION:  Received fresh is a 4.3 x 4 x 3.2 cm firm tan-white ovoid mass.  The cut surface is solid  and homogeneous.  Sections are submitted in 3 cassettes.  (GRP 10/11/2022)   Final Diagnosis performed by Orene Desanctis DO.   Electronically signed 10/12/2022 Technical component performed at [employer], [address], [place], [place] [address].  Professional component performed at [facility], [address] [place], [place] [address].  Immunohistochemistry Technical component (if applicable) was performed at [facility]. [address], [place], [place] [address].   IMMUNOHISTOCHEMISTRY DISCLAIMER (if applicable): Some of these immunohistochemical stains may have been developed and the performance characteristics determine by [facility]. Some may not have been cleared or approved by the U.S. Food and Drug Administration. The FDA has determined that such clearance or approval is not necessary. This test is used for clinical purposes. It should not be regarded as investigational or for research. This laboratory is certified under the Clinical Laboratory Improvement Amendments of 1988 (CLIA-88) as qualified to perform high complexity clinical laboratory testing.  The controls stained appropriately.     MR BRAIN W WO CONTRAST  Result Date: 10/12/2022 CLINICAL DATA:  Meningioma could EXAM: MRI HEAD WITHOUT AND WITH CONTRAST TECHNIQUE: Multiplanar, multiecho pulse sequences of the brain and surrounding structures were obtained without and with intravenous contrast. CONTRAST:  6mL GADAVIST GADOBUTROL 1 MMOL/ML IV SOLN COMPARISON:  MRI of the brain October 08, 2022. FINDINGS: Brain: Postsurgical changes from left parietal parafalcine dural-based extra-axial tumor resection. Surgical cavity is seen in the medial left parietal region containing blood products. Restricted diffusion is seen within the brain parenchyma surrounding the surgical cavity consistent with postsurgical ischemia. Persistent vasogenic edema  surrounding the surgical cavity, slightly less evident than on prior MRI.  On postcontrast images, no evidence of residual tumor is noted. Small fluid collection is seen subjacent to the craniotomy with blood products measuring up to 7 mm in thickness in causing anterior displacement of the superior sagittal sinus which appear preserved. Mild diffuse dural thickening and enhancement, may be related to postsurgical reduced intracranial pressure. Scattered and confluent foci of T2 hyperintensity are seen within the white matter of the cerebral hemispheres, nonspecific, most likely related to chronic small vessel ischemia. No hydrocephalus or acute territorial infarct. Vascular: Normal flow voids. Skull and upper cervical spine: Postsurgical changes from parietal craniotomy. No focal marrow lesion identified. Sinuses/Orbits: Bilateral mastoid effusion. Paranasal sinuses are clear. The orbits are maintained. Other: None. IMPRESSION: 1. Postsurgical changes from left parietal parafalcine dural-based extra-axial tumor resection. No evidence of residual tumor. 2. Small fluid collection subjacent to the craniotomy with blood products measuring up to 7 mm in thickness causing anterior displacement of the superior sagittal sinus which appear preserved. 3. Mild diffuse dural thickening and enhancement, may be related to postsurgical reduced intracranial pressure. 4. Moderate chronic microvascular ischemic changes of the white matter. Electronically Signed   By: Baldemar Lenis M.D.   On: 10/12/2022 15:09   ECHOCARDIOGRAM COMPLETE  Result Date: 10/09/2022    ECHOCARDIOGRAM REPORT   Patient Name:   Penny Hicks Date of Exam: 10/09/2022 Medical Rec #:  [id number]         Height:       64.0 in Accession #:    [id number]        Weight:       143.3 lb Date of Birth:  1950/08/01          BSA:          1.698 m Patient Age:    72 years          BP:           181/98 mmHg Patient Gender: F                 HR:            73 bpm. Exam Location:  Inpatient Procedure: 2D Echo, Cardiac Doppler and Color Doppler Indications:    Preop cardiovascular exam  History:        Patient has prior history of Echocardiogram examinations, most                 recent 02/10/2020. CHF; Risk Factors:Hypertension and                 Dyslipidemia.  Sonographer:    Wallie Char Referring Phys: [phone] [facility] SAMTANI IMPRESSIONS  1. Global hypokinesis with akinesis of the basal inferolateral wall.  2. Left ventricular ejection fraction, by estimation, is 30 to 35%. The left ventricle has moderately decreased function. The left ventricle demonstrates regional wall motion abnormalities (see scoring diagram/findings for description). There is moderate left ventricular hypertrophy. Left ventricular diastolic parameters are consistent with Grade I diastolic dysfunction (impaired relaxation).  3. Right ventricular systolic function is mildly reduced. The right ventricular size is normal.  4. Left atrial size was mildly dilated.  5. The mitral valve is normal in structure. Trivial mitral valve regurgitation. No evidence of mitral stenosis.  6. The aortic valve is tricuspid. Aortic valve regurgitation is not visualized. No aortic stenosis is present.  7. The inferior vena cava is dilated in size with >50% respiratory variability, suggesting right atrial pressure of 8 mmHg. FINDINGS  Left Ventricle: Left ventricular  ejection fraction, by estimation, is 30 to 35%. The left ventricle has moderately decreased function. The left ventricle demonstrates regional wall motion abnormalities. The left ventricular internal cavity size was normal in size. There is moderate left ventricular hypertrophy. Left ventricular diastolic parameters are consistent with Grade I diastolic dysfunction (impaired relaxation). Right Ventricle: The right ventricular size is normal. Right ventricular systolic function is mildly reduced. Left Atrium: Left atrial size was mildly dilated. Right  Atrium: Right atrial size was normal in size. Pericardium: There is no evidence of pericardial effusion. Mitral Valve: The mitral valve is normal in structure. Trivial mitral valve regurgitation. No evidence of mitral valve stenosis. MV peak gradient, 4.6 mmHg. The mean mitral valve gradient is 2.0 mmHg. Tricuspid Valve: The tricuspid valve is normal in structure. Tricuspid valve regurgitation is trivial. No evidence of tricuspid stenosis. Aortic Valve: The aortic valve is tricuspid. Aortic valve regurgitation is not visualized. No aortic stenosis is present. Aortic valve mean gradient measures 4.5 mmHg. Aortic valve peak gradient measures 7.7 mmHg. Aortic valve area, by VTI measures 3.05 cm. Pulmonic Valve: The pulmonic valve was normal in structure. Pulmonic valve regurgitation is trivial. No evidence of pulmonic stenosis. Aorta: The aortic root is normal in size and structure. Venous: The inferior vena cava is dilated in size with greater than 50% respiratory variability, suggesting right atrial pressure of 8 mmHg. IAS/Shunts: No atrial level shunt detected by color flow Doppler. Additional Comments: Global hypokinesis with akinesis of the basal inferolateral wall.  LEFT VENTRICLE PLAX 2D LVIDd:         4.90 cm      Diastology LVIDs:         4.00 cm      LV e' medial:    4.10 cm/s LV PW:         1.30 cm      LV E/e' medial:  13.6 LV IVS:        1.20 cm      LV e' lateral:   7.81 cm/s LVOT diam:     2.30 cm      LV E/e' lateral: 7.1 LV SV:         83 LV SV Index:   49 LVOT Area:     4.15 cm  LV Volumes (MOD) LV vol d, MOD A2C: 114.0 ml LV vol d, MOD A4C: 119.0 ml LV vol s, MOD A2C: 72.4 ml LV vol s, MOD A4C: 73.5 ml LV SV MOD A2C:     41.6 ml LV SV MOD A4C:     119.0 ml LV SV MOD BP:      43.9 ml RIGHT VENTRICLE             IVC RV Basal diam:  3.60 cm     IVC diam: 2.40 cm RV S prime:     15.60 cm/s TAPSE (M-mode): 1.8 cm LEFT ATRIUM             Index        RIGHT ATRIUM           Index LA diam:        4.40 cm  2.59 cm/m   RA Area:     13.30 cm LA Vol (A2C):   59.4 ml 34.98 ml/m  RA Volume:   30.30 ml  17.85 ml/m LA Vol (A4C):   60.3 ml 35.51 ml/m LA Biplane Vol: 63.9 ml 37.63 ml/m  AORTIC VALVE AV Area (Vmax):    3.01 cm AV  Area (Vmean):   2.87 cm AV Area (VTI):     3.05 cm AV Vmax:           138.50 cm/s AV Vmean:          99.050 cm/s AV VTI:            0.272 m AV Peak Grad:      7.7 mmHg AV Mean Grad:      4.5 mmHg LVOT Vmax:         100.40 cm/s LVOT Vmean:        68.350 cm/s LVOT VTI:          0.200 m LVOT/AV VTI ratio: 0.73  AORTA Ao Root diam: 3.60 cm Ao Asc diam:  3.70 cm MITRAL VALVE                TRICUSPID VALVE MV Area (PHT): 2.16 cm     TR Peak grad:   27.5 mmHg MV Area VTI:   2.91 cm     TR Vmax:        262.00 cm/s MV Peak grad:  4.6 mmHg MV Mean grad:  2.0 mmHg     SHUNTS MV Vmax:       1.07 m/s     Systemic VTI:  0.20 m MV Vmean:      56.1 cm/s    Systemic Diam: 2.30 cm MV Decel Time: 352 msec MV E velocity: 55.60 cm/s MV A velocity: 116.00 cm/s MV E/A ratio:  0.48 Olga Millers MD Electronically signed by Olga Millers MD Signature Date/Time: 10/09/2022/12:59:49 PM    Final    MR BRAIN W WO CONTRAST  Result Date: 10/08/2022 CLINICAL DATA:  Initial evaluation for brain/CNS neoplasm. Stereotactic protocol for surgical guidance. EXAM: MRI HEAD WITHOUT AND WITH CONTRAST TECHNIQUE: Multiplanar, multiecho pulse sequences of the brain and surrounding structures were obtained without and with intravenous contrast. CONTRAST:  6.45mL GADAVIST GADOBUTROL 1 MMOL/ML IV SOLN COMPARISON:  Prior studies from 10/06/22 and earlier. FINDINGS: Brain: Cerebral volume within normal limits. Patchy T2/FLAIR hyperintensity involving the periventricular deep white matter both cerebral hemispheres, consistent with chronic small vessel ischemic disease, moderately advanced in nature. Previous is identified round well-circumscribed extra-axial mass overlying the left parafalcine left parietal convexity. Lesion measures  3.6 x 3.7 x 4.1 cm (AP by transverse by craniocaudad). Associated T2/FLAIR signal abnormality within the adjacent posterior left cerebral hemisphere consistent with vasogenic edema. Localized left-to-right shift at the falx again noted. Small nodular focus of this lesion appears to cross the midline (series 6, image 110). On this postcontrast examination, this lesion is seen to abut but not invade the adjacent superior sagittal sinus. No other mass or abnormal enhancement. Examination to be utilized for surgical guidance purposes. Vascular: Normal intravascular enhancement seen throughout the intracranial circulation. Skull and upper cervical spine: Craniocervical junction within normal limits. Bone marrow signal intensity normal. No scalp soft tissue abnormality. Sinuses/Orbits: Globes orbital soft tissues within normal limits. Paranasal sinuses are largely clear. No significant mastoid effusion. Other: None. IMPRESSION: 1. 3.6 x 3.7 x 4.1 cm extra-axial mass overlying the parafalcine left parietal convexity, most consistent with a meningioma. Associated vasogenic edema with regional mass effect and localized left-to-right shift as above. Lesion abuts but does not invade the adjacent superior sagittal sinus. Examination to be utilized for surgical guidance purposes. 2. Underlying moderately advanced chronic microvascular ischemic disease. Electronically Signed   By: Rise Mu M.D.   On: 10/08/2022 20:56   Overnight EEG with video  Result Date: 10/07/2022 Charlsie Quest, MD     10/08/2022  8:18 AM Patient Name: Penny Hicks MRN: [id number] Epilepsy Attending: Charlsie Quest Referring Physician/Provider: Gordy Councilman, MD Duration: 10/06/2022 1045  to 10/07/2022 1315 Patient history: 72 yo woman with hx COPD, HTN, HL, tobacco abuse who presents with aphasia and R sided weakness favored to be 2/2 seizure 2/2 dural based mass at the parasagittal left parietal convexity with associated vasogenic  edema and localized left-to-right shift. EEG to evaluate for seizure Level of alertness: Awake, asleep AEDs during EEG study: LEV Technical aspects: This EEG study was done with scalp electrodes positioned according to the 10-20 International system of electrode placement. Electrical activity was reviewed with band pass filter of 1-70Hz , sensitivity of 7 uV/mm, display speed of 84mm/sec with a 60Hz  notched filter applied as appropriate. EEG data were recorded continuously and digitally stored.  Video monitoring was available and reviewed as appropriate. Description: The posterior dominant rhythm consists of 9 Hz activity of moderate voltage (25-35 uV) seen predominantly in posterior head regions, symmetric and reactive to eye opening and eye closing. Sleep was characterized by vertex waves, sleep spindles (12 to 14 Hz), maximal frontocentral region. Hyperventilation and photic stimulation were not performed.   Study was technically difficult due to significant electrode artifact. Study was interrupted between 10/06/2022 1657 to 1832, 10/07/2022 0419 to 0502 for other tests. IMPRESSION: This technically difficult study is within normal limits. No seizures or epileptiform discharges were seen throughout the recording. A normal interictal EEG does not exclude the diagnosis of epilepsy. Charlsie Quest   CT CHEST ABDOMEN PELVIS W CONTRAST  Result Date: 10/06/2022 CLINICAL DATA:  Brain metastases, unknown primary * Tracking Code: BO * EXAM: CT CHEST, ABDOMEN, AND PELVIS WITH CONTRAST TECHNIQUE: Multidetector CT imaging of the chest, abdomen and pelvis was performed following the standard protocol during bolus administration of intravenous contrast. RADIATION DOSE REDUCTION: This exam was performed according to the departmental dose-optimization program which includes automated exposure control, adjustment of the mA and/or kV according to patient size and/or use of iterative reconstruction technique. CONTRAST:  75mL  OMNIPAQUE IOHEXOL 350 MG/ML SOLN additional oral enteric contrast COMPARISON:  Chest radiographs, 02/11/2020 FINDINGS: CT CHEST FINDINGS Cardiovascular: Aortic atherosclerosis. Normal heart size. Left coronary artery calcifications. No pericardial effusion. Mediastinum/Nodes: No enlarged mediastinal, hilar, or axillary lymph nodes. Thyroid gland, trachea, and esophagus demonstrate no significant findings. Lungs/Pleura: Mild centrilobular emphysema. Diffuse bilateral bronchial wall thickening. Background of fine centrilobular pulmonary nodules, most concentrated in the lung apices. Small fissural nodules of the posterior left upper lobe measuring 0.4 cm (series 5, image 33, 30). Scarring and or atelectasis of the lung bases. No pleural effusion or pneumothorax. Musculoskeletal: No chest wall abnormality. CT ABDOMEN PELVIS FINDINGS Hepatobiliary: No solid liver abnormality is seen. Simple, benign liver cysts, for which no further follow-up or characterization is required. No gallstones or gallbladder wall thickening. Mild common bile duct dilatation, measuring up to 0.8 cm in caliber (series 6, image 38). Pancreas: Unremarkable. No pancreatic ductal dilatation or surrounding inflammatory changes. Spleen: Normal in size without significant abnormality. Adrenals/Urinary Tract: Adrenal glands are unremarkable. Kidneys are normal, without renal calculi, solid lesion, or hydronephrosis. Bladder is unremarkable. Stomach/Bowel: Stomach is within normal limits. Appendix appears normal. No evidence of bowel wall thickening, distention, or inflammatory changes. Sigmoid diverticulosis. Vascular/Lymphatic: Aortic atherosclerosis. No enlarged abdominal or pelvic lymph nodes. Reproductive: No mass or other abnormality. Other: No abdominal wall hernia or abnormality. No ascites. Musculoskeletal: When compared  to most recent chest radiographs dated 02/11/2020, there are new, although age indeterminate wedge deformities of T6 and T7  (series 7, image 82). Superior endplate deformity of T12 appears unchanged (series 7, image 84). IMPRESSION: 1. No specific evidence of metastatic disease in the chest, abdomen, or pelvis. 2. When compared to most recent chest radiographs dated 02/11/2020, there are new, although age indeterminate wedge deformities of T6 and T7. Superior endplate deformity of T12 appears unchanged. In the setting of other known metastatic disease, this should be regarded with substantial suspicion for pathologic fractures with underlying metastatic lesions. MRI may be helpful to further assess. 3. Small fissural nodules of the posterior left upper lobe measuring 0.4 cm, most likely benign incidental intrapulmonary lymph nodes. Metastatic disease strongly not favored. 4. Emphysema and diffuse bilateral bronchial wall thickening. Background of fine centrilobular pulmonary nodules, consistent with smoking-related respiratory bronchiolitis. 5. Small fissural nodules of the posterior left upper lobe measuring 0.4 cm, most likely benign incidental intrapulmonary lymph nodes. 6. Coronary artery disease. 7. Sigmoid diverticulosis without evidence of acute diverticulitis. Aortic Atherosclerosis (ICD10-I70.0) and Emphysema (ICD10-J43.9). Electronically Signed   By: Jearld Lesch M.D.   On: 10/06/2022 19:40   MR BRAIN WO CONTRAST  Result Date: 10/06/2022 CLINICAL DATA:  Initial evaluation for brain/CNS neoplasm. EXAM: MRI HEAD WITHOUT CONTRAST TECHNIQUE: Multiplanar, multiecho pulse sequences of the brain and surrounding structures were obtained without intravenous contrast. COMPARISON:  Prior studies from 10/05/2022. FINDINGS: Brain: Examination is technically limited as the patient was unable to tolerate the study. Diffusion-weighted sequences and axial FLAIR sequence only were obtained. Additionally, provided images are markedly degraded by motion. Cerebral volume within normal limits. Patchy T2/FLAIR hyperintensity involving the  supratentorial cerebral white matter, most characteristic of chronic microvascular ischemic disease, moderately advanced in nature. No evidence for acute or subacute ischemia. No visible areas of chronic cortical infarction. Previously identified dural based mass at the parasagittal left parietal convexity again seen. Lesion measures 4.2 x 3.5 cm. Associated vasogenic edema within the adjacent posterior left cerebral hemisphere. 7 mm of localized shift at the posterior falx. Lesions incompletely characterized on this limited exam. No other visible mass lesion or mass effect. No hydrocephalus or extra-axial fluid collection. Vascular: Major intracranial vascular flow voids are grossly maintained at the skull base. Skull and upper cervical spine: Grossly unremarkable. Sinuses/Orbits: Globes and orbital soft tissues grossly within normal limits. Paranasal sinuses are grossly clear. No significant mastoid effusion. Other: None. IMPRESSION: 1. Limited study due to the patient's inability to tolerate the full length of the exam and motion. 2. 4.2 x 3.5 cm dural based mass at the parasagittal left parietal convexity with associated vasogenic edema and localized left-to-right shift. This is incompletely assessed on this limited exam. No other visible mass or other acute intracranial abnormality. 3. Underlying moderately advanced chronic microvascular ischemic disease. Electronically Signed   By: Rise Mu M.D.   On: 10/06/2022 02:21   CT ANGIO HEAD NECK W WO CM W PERF (CODE STROKE)  Result Date: 10/05/2022 CLINICAL DATA:  Stroke suspected EXAM: CT ANGIOGRAPHY HEAD TECHNIQUE: Multidetector CT imaging of the head was performed using the standard protocol during bolus administration of intravenous contrast. Multiplanar CT image reconstructions and MIPs were obtained to evaluate the vascular anatomy. RADIATION DOSE REDUCTION: This exam was performed according to the departmental dose-optimization program which  includes automated exposure control, adjustment of the mA and/or kV according to patient size and/or use of iterative reconstruction technique. CONTRAST:  OMNIPAQUE IOHEXOL 350 MG/ML  SOLN COMPARISON:  Same day CT head FINDINGS: CT HEAD Redemonstrated is a dural-based lesion in the left parasagittal parietal lobe with surrounding vasogenic edema. Please see same day noncontrast enhanced CT brain for additional findings. This mass abuts the superior sagittal sinus but does not definitively invade it. CTA HEAD Anterior circulation: No significant stenosis, proximal occlusion, aneurysm, or vascular malformation. Posterior circulation: No significant stenosis, proximal occlusion, aneurysm, or vascular malformation. Venous sinuses: As permitted by contrast timing, patent. Anatomic variants: Fetal PCA on the left. Review of the MIP images confirms the above findings. IMPRESSION: 1. No intracranial large vessel occlusion or significant stenosis. 2. 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 This mass abuts the superior sagittal sinus but does not definitively invade it. Please see same day noncontrast enhanced CT brain for additional findings. Electronically Signed   By: Lorenza Cambridge M.D.   On: 10/05/2022 18:33   CT HEAD CODE STROKE WO CONTRAST  Result Date: 10/05/2022 CLINICAL DATA:  Code stroke.  Right-sided weakness. EXAM: CT HEAD WITHOUT CONTRAST TECHNIQUE: Contiguous axial images were obtained from the base of the skull through the vertex without intravenous contrast. RADIATION DOSE REDUCTION: This exam was performed according to the departmental dose-optimization program which includes automated exposure control, adjustment of the mA and/or kV according to patient size and/or use of iterative reconstruction technique. COMPARISON:  None Available. FINDINGS: Brain: No hemorrhage. No extra-axial fluid collection. No CT evidence of an acute  infarct. There is a 4.2 x 3.5 x 3.9 cm likely dural-based lesion along the parasagittal left parietal lobe. There is vasogenic edema surrounding this lesion, suggestive of parenchymal invasion. This lesion causes mass effect on the left lateral ventricular system. Vascular: No hyperdense vessel or unexpected calcification. Skull: Normal. Negative for fracture or focal lesion. Sinuses/Orbits: No middle ear or mastoid effusion. Other: None. ASPECTS ([place] Stroke Program Early CT Score: 10 IMPRESSION: 1. 4.2 cm likely dural-based lesion along the parasagittal left parietal lobe with vasogenic edema surrounding this lesion, suggestive of parenchymal invasion. This lesion causes mass effect on the left lateral ventricular system. Recommend MRI brain with and without contrast for further evaluation. 2. No acute intracranial hemorrhage or CT evidence of an acute infarct. Findings were paged to Dr. Selina Cooley via Loretha Stapler on paging system on 10/05/22 at 5:50 PM. Electronically Signed   By: Lorenza Cambridge M.D.   On: 10/05/2022 17:57    Antibiotics:  Anti-infectives (From admission, onward)    Start     Dose/Rate Route Frequency Ordered Stop   10/09/22 1952  ceFAZolin (ANCEF) IVPB 2g/100 mL premix        2 g 200 mL/hr over 30 Minutes Intravenous 30 min pre-op 10/09/22 1952 10/10/22 1302       Discharge Exam: Blood pressure (!) 163/98, pulse 65, temperature 97.8 F (36.6 C), temperature source Oral, resp. rate 18, height 5\' 4"  (1.626 m), weight 66 kg, SpO2 99 %. Neurologic: Grossly normal Ambulating and voiding well incisionc di   Discharge Medications:   Allergies as of 10/14/2022   No Known Allergies      Medication List     STOP taking these medications    lisinopril 20 MG tablet Commonly known as: ZESTRIL       TAKE these medications    albuterol 108 (90 Base) MCG/ACT inhaler Commonly known as: VENTOLIN HFA Inhale 2 puffs into the lungs every 4 (four) hours as needed for wheezing or shortness  of breath.  atorvastatin 20 MG tablet Commonly known as: LIPITOR Take 1 tablet (20 mg total) by mouth daily.   cholecalciferol 1000 units tablet Commonly known as: VITAMIN D Take 1,000 Units by mouth daily.   clotrimazole-betamethasone cream Commonly known as: LOTRISONE Apply 1 Application topically 2 (two) times daily.   dexamethasone 1 MG tablet Commonly known as: DECADRON Take 1 tablet (1 mg total) by mouth every 12 (twelve) hours.   dexamethasone 1 MG tablet Commonly known as: DECADRON Take 1 tablet (1 mg total) by mouth daily. Start taking on: Oct 15, 2022   ESTROVEN PO Take 1 tablet by mouth every evening.   levETIRAcetam 500 MG tablet Commonly known as: KEPPRA Take 1 tablet (500 mg total) by mouth 2 (two) times daily.   Melatonin 10 MG Caps Take 1 capsule by mouth at bedtime.   metoprolol tartrate 25 MG tablet Commonly known as: LOPRESSOR Take 1 tablet (25 mg total) by mouth 4 (four) times daily.   Potassium Gluconate 595 MG Caps Take 1 capsule by mouth daily.   sacubitril-valsartan 97-103 MG Commonly known as: ENTRESTO Take 1 tablet by mouth 2 (two) times daily.   SLEEP-AID PO Take 2 tablets by mouth at bedtime.   traMADol 50 MG tablet Commonly known as: ULTRAM Take 1 tablet (50 mg total) by mouth 4 (four) times daily as needed for moderate pain or severe pain.   vitamin E 180 MG (400 UNITS) capsule Take 400 Units by mouth daily.        Disposition: home   Final Dx: crani for resection of brain tumor  Discharge Instructions     Call MD for:  difficulty breathing, headache or visual disturbances   Complete by: As directed    Call MD for:  hives   Complete by: As directed    Call MD for:  persistant nausea and vomiting   Complete by: As directed    Call MD for:  redness, tenderness, or signs of infection (pain, swelling, redness, odor or green/yellow discharge around incision site)   Complete by: As directed    Call MD for:  severe  uncontrolled pain   Complete by: As directed    Call MD for:  temperature >100.4   Complete by: As directed    Diet - low sodium heart healthy   Complete by: As directed    Increase activity slowly   Complete by: As directed    No wound care   Complete by: As directed           Signed: Tiana Loft Juanna Pudlo 10/14/2022, 10:33 AM

## 2022-10-14 NOTE — Progress Notes (Signed)
Patient ready for discharge to home; discharge instructions given and reviewed; Rx's sent electronically; patient states she is out of ultram at home; MD paged to review pain medication post op per her request or as ordered prn pain as indicated on the AVS; activity and wound care reviewed with teach back; spouse present at bedside; appointments reviewed with patient; patient dressed and ready to discharge; patient discharged out via wheelchair; accompanied home by her spouse.

## 2022-10-19 DIAGNOSIS — I429 Cardiomyopathy, unspecified: Secondary | ICD-10-CM | POA: Insufficient documentation

## 2022-10-19 DIAGNOSIS — I517 Cardiomegaly: Secondary | ICD-10-CM | POA: Insufficient documentation

## 2022-10-19 NOTE — Progress Notes (Signed)
  Cardiology Office Note:   Date:  10/20/2022  ID:  Penny Hicks, DOB [birth year], MRN [id number]  History of Present Illness:   Penny Hicks is a 72 y.o. female the patient was recently hospitalized for resection of meningioma.  I saw her preoperatively.  She has a past history of a dilated cardiomyopathy.  This was found in 2016 when her EF was 40 to 45%.  She was hospitalized for hip fracture at that time but there was no outpatient follow-up.  I do see that she was treated with ACE inhibitors and beta-blockers.  In 2021 her EF was 35% when she was admitted with sepsis.  It was again 35% when she was in the hospital recently and restarted med titration as listed elsewhere.  She did well with her craniotomy.  She has done well since I saw her.  She has had no new cardiovascular complaints. The patient denies any new symptoms such as chest discomfort, neck or arm discomfort. There has been no new shortness of breath, PND or orthopnea. There have been no reported palpitations, presyncope or syncope.  She is having some discomfort where the staples are.  ROS:  As stated in the HPI and negative for all other systems.  Studies Reviewed:    EKG:  NA    Risk Assessment/Calculations:              Physical Exam:   VS:  BP 138/82   Pulse (!) 56   Ht 5\' 4"  (1.626 m)   Wt 156 lb (70.8 kg)   SpO2 98%   BMI 26.78 kg/m    Wt Readings from Last 3 Encounters:  10/20/22 156 lb (70.8 kg)  10/10/22 145 lb 8.1 oz (66 kg)  02/09/20 143 lb 9.6 oz (65.1 kg)     GEN: Well nourished, well developed in no acute distress NECK: No JVD; No carotid bruits CARDIAC: RRR, no murmurs, rubs, gallops RESPIRATORY:  Clear to auscultation without rales, wheezing or rhonchi  ABDOMEN: Soft, non-tender, non-distended EXTREMITIES:  No edema; No deformity   ASSESSMENT AND PLAN:   Cardiomyopathy:   I will continue to titrate meds and repeat an echocardiogram in the future.  If her ejection fraction remains low  she will need ischemia workup.  Today I am going to consolidate her metoprolol from 4 times a day immediate release to Toprol-XL 100 mg daily.  I am going to start spironolactone 25 mg daily.  She will stop over-the-counter potassium.  When we see her back most likely we can add [place].  She has some slower heart rates with titrate the beta-blocker yet.  I will check a basic metabolic profile in 10 days.   LVH: I will have a low threshold for MRI in the future to rule out infiltrative disease although this is likely related to uncontrolled hypertension.  Will evaluate as an out patient.    Suspect this is related to uncontrolled HTN.    Dyslipidemia: She started Lipitor and most recent admission.  She did have some aortic atherosclerosis.    HTN:  This is being managed in the context of treating his CHF .         Signed, Rollene Rotunda, MD

## 2022-10-20 ENCOUNTER — Encounter: Payer: Self-pay | Admitting: Cardiology

## 2022-10-20 ENCOUNTER — Ambulatory Visit: Payer: 59 | Attending: Cardiology | Admitting: Cardiology

## 2022-10-20 VITALS — BP 138/82 | HR 56 | Ht 64.0 in | Wt 156.0 lb

## 2022-10-20 DIAGNOSIS — E785 Hyperlipidemia, unspecified: Secondary | ICD-10-CM

## 2022-10-20 DIAGNOSIS — I517 Cardiomegaly: Secondary | ICD-10-CM

## 2022-10-20 DIAGNOSIS — I10 Essential (primary) hypertension: Secondary | ICD-10-CM

## 2022-10-20 DIAGNOSIS — I429 Cardiomyopathy, unspecified: Secondary | ICD-10-CM | POA: Diagnosis not present

## 2022-10-20 MED ORDER — SPIRONOLACTONE 25 MG PO TABS: 25.0000 mg | ORAL_TABLET | Freq: Every day | ORAL | 3 refills | Status: DC

## 2022-10-20 MED ORDER — METOPROLOL SUCCINATE ER 25 MG PO TB24: 25.0000 mg | ORAL_TABLET | Freq: Every day | ORAL | 3 refills | Status: DC

## 2022-10-20 NOTE — Patient Instructions (Signed)
Medication Instructions:   STOP CURRENT METOPROLOL  START METOPROLOL SUCC ER 25 MG ONCE DAILY AT BEDTIME  START SPIRONOLACTONE 25 MG ONCE DAILY  STOP POTASSIUM  *If you need a refill on your cardiac medications before your next appointment, please call your pharmacy*   Lab Work:  Your physician recommends that you return for lab work in: 10 DAYS-DO NOT NEED TO FAST  If you have labs (blood work) drawn today and your tests are completely normal, you will receive your results only by: MyChart Message (if you have MyChart) OR A paper copy in the mail If you have any lab test that is abnormal or we need to change your treatment, we will call you to review the results.   Follow-Up: At [facility], you and your health needs are our priority.  As part of our continuing mission to provide you with exceptional heart care, we have created designated Provider Care Teams.  These Care Teams include your primary Cardiologist (physician) and Advanced Practice Providers (APPs -  Physician Assistants and Nurse Practitioners) who all work together to provide you with the care you need, when you need it.  We recommend signing up for the patient portal called "MyChart".  Sign up information is provided on this After Visit Summary.  MyChart is used to connect with patients for Virtual Visits (Telemedicine).  Patients are able to view lab/test results, encounter notes, upcoming appointments, etc.  Non-urgent messages can be sent to your provider as well.   To learn more about what you can do with MyChart, go to [email or web address].    Your next appointment:   1 month(s)  Provider:   ANY APP

## 2022-10-31 LAB — BASIC METABOLIC PANEL
BUN/Creatinine Ratio: 17 (ref 12–28)
BUN: 19 mg/dL (ref 8–27)
CO2: 25 mmol/L (ref 20–29)
Calcium: 9.7 mg/dL (ref 8.7–10.3)
Chloride: 104 mmol/L (ref 96–106)
Creatinine, Ser: 1.13 mg/dL — ABNORMAL HIGH (ref 0.57–1.00)
Glucose: 90 mg/dL (ref 70–99)
Potassium: 4.3 mmol/L (ref 3.5–5.2)
Sodium: 143 mmol/L (ref 134–144)
eGFR: 51 mL/min/{1.73_m2} — ABNORMAL LOW (ref >59)

## 2022-11-01 ENCOUNTER — Encounter: Payer: Self-pay | Admitting: *Deleted

## 2022-11-09 ENCOUNTER — Telehealth: Payer: Self-pay | Admitting: Cardiology

## 2022-11-09 MED ORDER — SACUBITRIL-VALSARTAN 97-103 MG PO TABS: 1.0000 | ORAL_TABLET | Freq: Two times a day (BID) | ORAL | 3 refills | Status: DC

## 2022-11-09 NOTE — Telephone Encounter (Signed)
Spoke with daughter per DPR and her mom was started on Entresto while in the hospital. She states it was filled by a NP in her PCP office the beginning of May but when she called the office for a refill they stated they will not fill the Entresto. She would like to know if you can take over the [facility]. Patient have enough tablets to last until Monday.

## 2022-11-09 NOTE — Telephone Encounter (Signed)
Pt c/o medication issue:  1. Name of Medication:   sacubitril-valsartan (ENTRESTO) 97-103 MG    2. How are you currently taking this medication (dosage and times per day)?   3. Are you having a reaction (difficulty breathing--STAT)?   4. What is your medication issue? Patient's daughter is calling to see if this medication can be called in for patient and taken over by cardiologist. Please advise.   If it can be called in, please send to:    [id number] - [place], [place] - [address]    90

## 2022-11-09 NOTE — Telephone Encounter (Signed)
Spoke with pt daughter, aware refills sent to the pharmacy. ° °

## 2022-11-21 ENCOUNTER — Encounter: Payer: Self-pay | Admitting: Nurse Practitioner

## 2022-11-21 ENCOUNTER — Ambulatory Visit: Payer: 59 | Attending: Nurse Practitioner | Admitting: Nurse Practitioner

## 2022-11-21 VITALS — BP 118/72 | HR 88 | Ht 65.0 in | Wt 156.6 lb

## 2022-11-21 DIAGNOSIS — I517 Cardiomegaly: Secondary | ICD-10-CM | POA: Diagnosis not present

## 2022-11-21 DIAGNOSIS — I10 Essential (primary) hypertension: Secondary | ICD-10-CM | POA: Diagnosis not present

## 2022-11-21 DIAGNOSIS — E785 Hyperlipidemia, unspecified: Secondary | ICD-10-CM

## 2022-11-21 DIAGNOSIS — I429 Cardiomyopathy, unspecified: Secondary | ICD-10-CM

## 2022-11-21 NOTE — Progress Notes (Signed)
Office Visit    Patient Name: Penny Hicks Date of Encounter: 11/21/2022  Primary Care Provider:  Assunta Found, MD Primary Cardiologist:  Rollene Rotunda, MD  Chief Complaint    72 year old female with a history of cardiomyopathy, LVH, hypertension, dyslipidemia, and prediabetes who presents for follow-up related to cardiomyopathy.   Past Medical History    Past Medical History:  Diagnosis Date   Arthritis    COPD (chronic obstructive pulmonary disease) (HCC)    Hypercholesterolemia    Hypertension    Hypothyroidism    Past Surgical History:  Procedure Laterality Date   APPLICATION OF CRANIAL NAVIGATION Left 10/10/2022   Procedure: APPLICATION OF CRANIAL NAVIGATION;  Surgeon: Dawley, Alan Mulder, DO;  Location: [facility] OR;  Service: Neurosurgery;  Laterality: Left;   CESAREAN SECTION  x 2   CRANIOTOMY Left 10/10/2022   Procedure: LEFT CRANIOTOMY TUMOR EXCISION;  Surgeon: Bethann Goo, DO;  Location: [facility] OR;  Service: Neurosurgery;  Laterality: Left;   HIP PINNING,CANNULATED Right 05/25/2015   Procedure: CANNULATED HIP PINNING;  Surgeon: Jodi Geralds, MD;  Location: [facility] OR;  Service: Orthopedics;  Laterality: Right;   RADIOLOGY WITH ANESTHESIA N/A 10/08/2022   Procedure: MRI WITH ANESTHESIA;  Surgeon: Radiologist, Medication, MD;  Location: [facility] OR;  Service: Radiology;  Laterality: N/A;   TONSILLECTOMY      Allergies  No Known Allergies   Labs/Other Studies Reviewed    The following studies were reviewed today:  Cardiac Studies & Procedures       ECHOCARDIOGRAM  ECHOCARDIOGRAM COMPLETE 10/09/2022  Narrative ECHOCARDIOGRAM REPORT    Patient Name:   Penny Hicks Date of Exam: 10/09/2022 Medical Rec #:  [id number]         Height:       64.0 in Accession #:    [id number]        Weight:       143.3 lb Date of Birth:  Jan 15, 1951          BSA:          1.698 m Patient Age:    72 years          BP:           181/98 mmHg Patient Gender: F                 HR:           73  bpm. Exam Location:  Inpatient  Procedure: 2D Echo, Cardiac Doppler and Color Doppler  Indications:    Preop cardiovascular exam  History:        Patient has prior history of Echocardiogram examinations, most recent 02/10/2020. CHF; Risk Factors:Hypertension and Dyslipidemia.  Sonographer:    Wallie Char Referring Phys: [id number] [facility] SAMTANI  IMPRESSIONS   1. Global hypokinesis with akinesis of the basal inferolateral wall. 2. Left ventricular ejection fraction, by estimation, is 30 to 35%. The left ventricle has moderately decreased function. The left ventricle demonstrates regional wall motion abnormalities (see scoring diagram/findings for description). There is moderate left ventricular hypertrophy. Left ventricular diastolic parameters are consistent with Grade I diastolic dysfunction (impaired relaxation). 3. Right ventricular systolic function is mildly reduced. The right ventricular size is normal. 4. Left atrial size was mildly dilated. 5. The mitral valve is normal in structure. Trivial mitral valve regurgitation. No evidence of mitral stenosis. 6. The aortic valve is tricuspid. Aortic valve regurgitation is not visualized. No aortic stenosis is present. 7. The inferior vena cava is dilated in size with >  50% respiratory variability, suggesting right atrial pressure of 8 mmHg.  FINDINGS Left Ventricle: Left ventricular ejection fraction, by estimation, is 30 to 35%. The left ventricle has moderately decreased function. The left ventricle demonstrates regional wall motion abnormalities. The left ventricular internal cavity size was normal in size. There is moderate left ventricular hypertrophy. Left ventricular diastolic parameters are consistent with Grade I diastolic dysfunction (impaired relaxation).  Right Ventricle: The right ventricular size is normal. Right ventricular systolic function is mildly reduced.  Left Atrium: Left atrial size was mildly dilated.  Right  Atrium: Right atrial size was normal in size.  Pericardium: There is no evidence of pericardial effusion.  Mitral Valve: The mitral valve is normal in structure. Trivial mitral valve regurgitation. No evidence of mitral valve stenosis. MV peak gradient, 4.6 mmHg. The mean mitral valve gradient is 2.0 mmHg.  Tricuspid Valve: The tricuspid valve is normal in structure. Tricuspid valve regurgitation is trivial. No evidence of tricuspid stenosis.  Aortic Valve: The aortic valve is tricuspid. Aortic valve regurgitation is not visualized. No aortic stenosis is present. Aortic valve mean gradient measures 4.5 mmHg. Aortic valve peak gradient measures 7.7 mmHg. Aortic valve area, by VTI measures 3.05 cm.  Pulmonic Valve: The pulmonic valve was normal in structure. Pulmonic valve regurgitation is trivial. No evidence of pulmonic stenosis.  Aorta: The aortic root is normal in size and structure.  Venous: The inferior vena cava is dilated in size with greater than 50% respiratory variability, suggesting right atrial pressure of 8 mmHg.  IAS/Shunts: No atrial level shunt detected by color flow Doppler.  Additional Comments: Global hypokinesis with akinesis of the basal inferolateral wall.   LEFT VENTRICLE PLAX 2D LVIDd:         4.90 cm      Diastology LVIDs:         4.00 cm      LV e' medial:    4.10 cm/s LV PW:         1.30 cm      LV E/e' medial:  13.6 LV IVS:        1.20 cm      LV e' lateral:   7.81 cm/s LVOT diam:     2.30 cm      LV E/e' lateral: 7.1 LV SV:         83 LV SV Index:   49 LVOT Area:     4.15 cm  LV Volumes (MOD) LV vol d, MOD A2C: 114.0 ml LV vol d, MOD A4C: 119.0 ml LV vol s, MOD A2C: 72.4 ml LV vol s, MOD A4C: 73.5 ml LV SV MOD A2C:     41.6 ml LV SV MOD A4C:     119.0 ml LV SV MOD BP:      43.9 ml  RIGHT VENTRICLE             IVC RV Basal diam:  3.60 cm     IVC diam: 2.40 cm RV S prime:     15.60 cm/s TAPSE (M-mode): 1.8 cm  LEFT ATRIUM             Index         RIGHT ATRIUM           Index LA diam:        4.40 cm 2.59 cm/m   RA Area:     13.30 cm LA Vol (A2C):   59.4 ml 34.98 ml/m  RA Volume:   30.30 ml  17.85 ml/m LA Vol (A4C):   60.3 ml 35.51 ml/m LA Biplane Vol: 63.9 ml 37.63 ml/m AORTIC VALVE AV Area (Vmax):    3.01 cm AV Area (Vmean):   2.87 cm AV Area (VTI):     3.05 cm AV Vmax:           138.50 cm/s AV Vmean:          99.050 cm/s AV VTI:            0.272 m AV Peak Grad:      7.7 mmHg AV Mean Grad:      4.5 mmHg LVOT Vmax:         100.40 cm/s LVOT Vmean:        68.350 cm/s LVOT VTI:          0.200 m LVOT/AV VTI ratio: 0.73  AORTA Ao Root diam: 3.60 cm Ao Asc diam:  3.70 cm  MITRAL VALVE                TRICUSPID VALVE MV Area (PHT): 2.16 cm     TR Peak grad:   27.5 mmHg MV Area VTI:   2.91 cm     TR Vmax:        262.00 cm/s MV Peak grad:  4.6 mmHg MV Mean grad:  2.0 mmHg     SHUNTS MV Vmax:       1.07 m/s     Systemic VTI:  0.20 m MV Vmean:      56.1 cm/s    Systemic Diam: 2.30 cm MV Decel Time: 352 msec MV E velocity: 55.60 cm/s MV A velocity: 116.00 cm/s MV E/A ratio:  0.48  Olga Millers MD Electronically signed by Olga Millers MD Signature Date/Time: 10/09/2022/12:59:49 PM    Final            Recent Labs: 10/09/2022: ALT 28 10/13/2022: Hemoglobin 14.9; Magnesium 1.8; Platelets 286 10/30/2022: BUN 19; Creatinine, Ser 1.13; Potassium 4.3; Sodium 143  Recent Lipid Panel No results found for: "CHOL", "TRIG", "HDL", "CHOLHDL", "VLDL", "LDLCALC", "LDLDIRECT"  History of Present Illness    72 year old female with the above past medical history including  cardiomyopathy, LVH, hypertension, dyslipidemia, and prediabetes.    Echocardiogram in 2016 showed EF 40 to 45%.  She was hospitalized for hip fracture at the time.  She did not have outpatient follow-up with cardiology.  EF in 2021 was 35% (elevated during hospital admission for sepsis).  She was hospitalized in May 2024 in the setting of resection of  meningioma s/p craniotomy.  Repeat echocardiogram showed EF 30 to 35%, moderately decreased LV function, RWMA, moderate LVH, G1 DD, mildly reduced RV systolic function, no significant valvular abnormalities.  She was started on GDMT.  She was last seen in the office on 10/20/2022 and was stable from a cardiac standpoint.  She was started on spironolactone with hopes of adding Farxiga at her next follow-up visit.  Additionally, she was transitioned to metoprolol XL.  She presents today for follow-up accompanied by her daughter and granddaughter.  Since her last visit she has done well from a cardiac standpoint.  She is tolerating spironolactone and the transition from metoprolol to tartrate to metoprolol succinate.  She denies any chest pain, dyspnea, edema, PND, orthopnea, weight gain.  She does note that she currently has a yeast infection. She was recently diagnosed with prediabetes per her PCP and has been dramatically reducing her sugar and sodium intake.   She has noted some nausea  and occasional vomiting. She wonders if this could be contributing to her symptoms of nausea.  Otherwise, she reports feeling well.  Home Medications    Current Outpatient Medications  Medication Sig Dispense Refill   albuterol (VENTOLIN HFA) 108 (90 Base) MCG/ACT inhaler Inhale 2 puffs into the lungs every 4 (four) hours as needed for wheezing or shortness of breath. 18 g 3   atorvastatin (LIPITOR) 20 MG tablet Take 1 tablet (20 mg total) by mouth daily. 45 tablet 0   Black Cohosh-SoyIsoflav-Magnol (ESTROVEN MENOPAUSE RELIEF) CAPS      cholecalciferol (VITAMIN D) 1000 UNITS tablet Take 1,000 Units by mouth daily.     clotrimazole-betamethasone (LOTRISONE) cream Apply 1 Application topically 2 (two) times daily.     dexamethasone (DECADRON) 1 MG tablet Take 1 tablet (1 mg total) by mouth every 12 (twelve) hours. 2 tablet 0   dexamethasone (DECADRON) 1 MG tablet Take 1 tablet (1 mg total) by mouth daily. 2 tablet 0    Doxylamine Succinate, Sleep, (SLEEP-AID PO) Take 2 tablets by mouth at bedtime.     levETIRAcetam (KEPPRA) 500 MG tablet Take 1 tablet (500 mg total) by mouth 2 (two) times daily. 45 tablet 0   Melatonin 10 MG CAPS Take 1 capsule by mouth at bedtime.     metoprolol succinate (TOPROL XL) 25 MG 24 hr tablet Take 1 tablet (25 mg total) by mouth at bedtime. 90 tablet 3   Nutritional Supplements (ESTROVEN PO) Take 1 tablet by mouth every evening.     sacubitril-valsartan (ENTRESTO) 97-103 MG Take 1 tablet by mouth 2 (two) times daily. 180 tablet 3   spironolactone (ALDACTONE) 25 MG tablet Take 1 tablet (25 mg total) by mouth daily. 90 tablet 3   traMADol (ULTRAM) 50 MG tablet Take 1 tablet (50 mg total) by mouth 4 (four) times daily as needed for moderate pain or severe pain. 30 tablet 0   vitamin E 400 UNIT capsule Take 400 Units by mouth daily.     zolpidem (AMBIEN) 10 MG tablet Take 10 mg by mouth daily.     No current facility-administered medications for this visit.     Review of Systems    She denies chest pain, palpitations, dyspnea, pnd, orthopnea, n, v, dizziness, syncope, edema, weight gain, or early satiety. All other systems reviewed and are otherwise negative except as noted above.   Physical Exam    VS:  BP 118/72 (BP Location: Left Arm, Patient Position: Sitting, Cuff Size: Normal)   Pulse 88   Ht 5\' 5"  (1.651 m)   Wt 156 lb 9.6 oz (71 kg)   SpO2 95%   BMI 26.06 kg/m  GEN: Well nourished, well developed, in no acute distress. HEENT: normal. Neck: Supple, no JVD, carotid bruits, or masses. Cardiac: RRR, no murmurs, rubs, or gallops. No clubbing, cyanosis, edema.  Radials/DP/PT 2+ and equal bilaterally.  Respiratory:  Respirations regular and unlabored, clear to auscultation bilaterally. GI: Soft, nontender, nondistended, BS + x 4. MS: no deformity or atrophy. Skin: warm and dry, no rash. Neuro:  Strength and sensation are intact. Psych: Normal affect.  Accessory  Clinical Findings    ECG personally reviewed by me today - No EKG in office today.  Lab Results  Component Value Date   WBC 13.5 (H) 10/13/2022   HGB 14.9 10/13/2022   HCT 45.5 10/13/2022   MCV 93.2 10/13/2022   PLT 286 10/13/2022   Lab Results  Component Value Date   CREATININE 1.13 (H)  10/30/2022   BUN 19 10/30/2022   NA 143 10/30/2022   K 4.3 10/30/2022   CL 104 10/30/2022   CO2 25 10/30/2022   Lab Results  Component Value Date   ALT 28 10/09/2022   AST 35 10/09/2022   ALKPHOS 53 10/09/2022   BILITOT 0.6 10/09/2022   No results found for: "CHOL", "HDL", "LDLCALC", "LDLDIRECT", "TRIG", "CHOLHDL"  Lab Results  Component Value Date   HGBA1C 6.0 (H) 02/09/2020    Assessment & Plan    1. Cardiomyopathy: Prior EF as low as 35%.  Most recent echo in 09/2022 showed EF 30 to 35%, moderately decreased LV function, RWMA, moderate LVH, G1 DD, mildly reduced RV systolic function, no significant valvular abnormalities.  Euvolemic and well compensated on exam.  She is tolerating current GDMT.  Given current yeast infection, will defer initiation of SGLT2 inhibitor.  Will plan for repeat echo in 3 months.  If EF remains decreased, will need to consider ischemic evaluation.  Currently stable with no anginal symptoms.  Continue metoprolol, Entresto, spironolactone.  No indication for loop diuretic.  2. LVH: Likely in the setting of uncontrolled hypertension.  BP is currently well-controlled.  However, per Dr. Antoine Poche, low threshold for MRI in the future to rule out infiltrative disease.  3. Hypertension: BP well controlled. Continue current antihypertensive regimen.   4. Dyslipidemia: No recent LDL on file.  She notes that she had labs drawn with her PCP.  Will request most recent lab results.  Continue atorvastatin.  5. Prediabetes: A1c was 6.7 in 11/2022, technically qualifying as type 2 diabetes. Monitored and managed per PCP.   6..Disposition:  Follow-up with APP in 3 to 4 months.        Joylene Grapes, NP 11/21/2022, 3:04 PM

## 2022-11-21 NOTE — Patient Instructions (Addendum)
Medication Instructions:  Your physician recommends that you continue on your current medications as directed. Please refer to the Current Medication list given to you today.  *If you need a refill on your cardiac medications before your next appointment, please call your pharmacy*   Lab Work: NONE ordered at this time of appointment    Testing/Procedures: Your physician has requested that you have an echocardiogram in 3 months. Echocardiography is a painless test that uses sound waves to create images of your heart. It provides your doctor with information about the size and shape of your heart and how well your heart's chambers and valves are working. This procedure takes approximately one hour. There are no restrictions for this procedure. Please do NOT wear cologne, perfume, aftershave, or lotions (deodorant is allowed). Please arrive 15 minutes prior to your appointment time.    Follow-Up: At [facility], you and your health needs are our priority.  As part of our continuing mission to provide you with exceptional heart care, we have created designated Provider Care Teams.  These Care Teams include your primary Cardiologist (physician) and Advanced Practice Providers (APPs -  Physician Assistants and Nurse Practitioners) who all work together to provide you with the care you need, when you need it.  We recommend signing up for the patient portal called "MyChart".  Sign up information is provided on this After Visit Summary.  MyChart is used to connect with patients for Virtual Visits (Telemedicine).  Patients are able to view lab/test results, encounter notes, upcoming appointments, etc.  Non-urgent messages can be sent to your provider as well.   To learn more about what you can do with MyChart, go to [email or web address].    Your next appointment:   3-4 month(s)  Provider:   Rollene Rotunda, MD  or Bernadene Person, NP        Other Instructions

## 2022-11-27 ENCOUNTER — Other Ambulatory Visit: Payer: Self-pay

## 2022-11-27 MED ORDER — ATORVASTATIN CALCIUM 20 MG PO TABS: 20.0000 mg | ORAL_TABLET | Freq: Every day | ORAL | 3 refills | Status: DC

## 2023-01-10 ENCOUNTER — Telehealth: Payer: Self-pay

## 2023-01-10 MED ORDER — METOPROLOL SUCCINATE ER 25 MG PO TB24: 25.0000 mg | ORAL_TABLET | Freq: Every day | ORAL | 3 refills | Status: DC

## 2023-01-10 MED ORDER — SPIRONOLACTONE 25 MG PO TABS: 25.0000 mg | ORAL_TABLET | Freq: Every day | ORAL | 3 refills | Status: DC

## 2023-01-10 NOTE — Telephone Encounter (Signed)
Received a call from the patient requesting a refill on Metoprolol and Spironolactone.   Reviewed chart and patient has been seen within a year.   Rx(s) sent to pharmacy electronically.  Patient agreeable and voiced understanding.

## 2023-01-25 ENCOUNTER — Telehealth: Payer: Self-pay | Admitting: Cardiology

## 2023-01-25 NOTE — Telephone Encounter (Signed)
*  STAT* If patient is at the pharmacy, call can be transferred to refill team.   1. Which medications need to be refilled? (please list name of each medication and dose if known) levETIRAcetam (KEPPRA) 500 MG tablet   2. Which pharmacy/location (including street and city if local pharmacy) is medication to be sent to? [id number] - [place], [place] - [address]    3. Do they need a 30 day or 90 day supply? 30 Day Supply  Pt is scheduled for 09/16.

## 2023-01-26 NOTE — Telephone Encounter (Signed)
Called pt to inform her that she needed to contact her PCP for a refill on her medication Levetiracetam (Keppra). I advised the pt that if she has any other cardiac problems, questions or concerns, to give our office a call back. Pt verbalized understanding.

## 2023-02-21 ENCOUNTER — Ambulatory Visit (HOSPITAL_COMMUNITY): Payer: 59

## 2023-02-26 ENCOUNTER — Ambulatory Visit: Payer: 59 | Admitting: Nurse Practitioner

## 2023-03-08 ENCOUNTER — Ambulatory Visit (HOSPITAL_COMMUNITY): Payer: 59 | Attending: Nurse Practitioner

## 2023-03-08 DIAGNOSIS — I517 Cardiomegaly: Secondary | ICD-10-CM | POA: Diagnosis present

## 2023-03-08 DIAGNOSIS — I429 Cardiomyopathy, unspecified: Secondary | ICD-10-CM | POA: Insufficient documentation

## 2023-03-08 DIAGNOSIS — I428 Other cardiomyopathies: Secondary | ICD-10-CM

## 2023-03-08 LAB — ECHOCARDIOGRAM COMPLETE
Area-P 1/2: 2.5 cm2
S' Lateral: 3.9 cm

## 2023-03-21 ENCOUNTER — Telehealth: Payer: Self-pay

## 2023-03-21 NOTE — Telephone Encounter (Signed)
Per provider: Message relayed to patient.   Recent echocardiogram showed heart pumping function remains decreased, the left bottom chamber of the heart is moderately thickened and mildly stiff, there were no significant valvular abnormalities.  We can discuss the results in greater detail at your follow-up visit as well recommendations for ongoing management.  For now, continue current medications and follow-up as planned.  Thank you.

## 2023-03-21 NOTE — Telephone Encounter (Signed)
Lmom to discuss echocardiogram results. Waiting on a return call.

## 2023-03-21 NOTE — Telephone Encounter (Signed)
Patient is returning call and is requesting call back.  

## 2023-03-27 NOTE — Progress Notes (Unsigned)
Office Visit    Patient Name: Penny Hicks Date of Encounter: 03/28/2023  Primary Care Provider:  Assunta Found, MD Primary Cardiologist:  Rollene Rotunda, MD  Chief Complaint    72 year old female with a history of cardiomyopathy, LVH, hypertension, dyslipidemia, and prediabetes who presents for follow-up related to cardiomyopathy.    Past Medical History    Past Medical History:  Diagnosis Date   Arthritis    COPD (chronic obstructive pulmonary disease) (HCC)    Hypercholesterolemia    Hypertension    Hypothyroidism    Past Surgical History:  Procedure Laterality Date   APPLICATION OF CRANIAL NAVIGATION Left 10/10/2022   Procedure: APPLICATION OF CRANIAL NAVIGATION;  Surgeon: Dawley, Alan Mulder, DO;  Location: [facility] OR;  Service: Neurosurgery;  Laterality: Left;   CESAREAN SECTION  x 2   CRANIOTOMY Left 10/10/2022   Procedure: LEFT CRANIOTOMY TUMOR EXCISION;  Surgeon: Bethann Goo, DO;  Location: [facility] OR;  Service: Neurosurgery;  Laterality: Left;   HIP PINNING,CANNULATED Right 05/25/2015   Procedure: CANNULATED HIP PINNING;  Surgeon: Jodi Geralds, MD;  Location: [facility] OR;  Service: Orthopedics;  Laterality: Right;   RADIOLOGY WITH ANESTHESIA N/A 10/08/2022   Procedure: MRI WITH ANESTHESIA;  Surgeon: Radiologist, Medication, MD;  Location: [facility] OR;  Service: Radiology;  Laterality: N/A;   TONSILLECTOMY      Allergies  No Known Allergies   Labs/Other Studies Reviewed    The following studies were reviewed today:  Cardiac Studies & Procedures       ECHOCARDIOGRAM  ECHOCARDIOGRAM COMPLETE 03/08/2023  Narrative ECHOCARDIOGRAM REPORT    Patient Name:   Penny Hicks Date of Exam: 03/08/2023 Medical Rec #:  [id number]         Height:       65.0 in Accession #:    [id number]        Weight:       156.6 lb Date of Birth:  1950-09-29          BSA:          1.783 m Patient Age:    72 years          BP:           118/72 mmHg Patient Gender: F                 HR:            74 bpm. Exam Location:  [address]  Procedure: 2D Echo, Cardiac Doppler and Color Doppler  Indications:    I42.9 Cardiomyopathy  History:        Patient has prior history of Echocardiogram examinations, most recent 10/09/2022. Cardiomyopathy, LVH and CHF, COPD; Risk Factors:Hypertension and Dyslipidemia.  Sonographer:    Samule Ohm RDCS Referring Phys: [phone] Brok Stocking C Ziasia Lenoir  IMPRESSIONS   1. Left ventricular ejection fraction, by estimation, is 35 to 40%. The left ventricle has moderately decreased function. The left ventricle demonstrates global hypokinesis. There is moderate left ventricular hypertrophy. Left ventricular diastolic parameters are consistent with Grade I diastolic dysfunction (impaired relaxation). 2. Right ventricular systolic function is normal. The right ventricular size is normal. There is normal pulmonary artery systolic pressure. The estimated right ventricular systolic pressure is 25.3 mmHg. 3. Left atrial size was severely dilated. 4. The mitral valve is normal in structure. No evidence of mitral valve regurgitation. No evidence of mitral stenosis. 5. The aortic valve is normal in structure. Aortic valve regurgitation is not visualized. No aortic stenosis is present. 6.  The inferior vena cava is normal in size with greater than 50% respiratory variability, suggesting right atrial pressure of 3 mmHg.  Comparison(s): EF prior 30-35%.  FINDINGS Left Ventricle: Left ventricular ejection fraction, by estimation, is 35 to 40%. The left ventricle has moderately decreased function. The left ventricle demonstrates global hypokinesis. The left ventricular internal cavity size was normal in size. There is moderate left ventricular hypertrophy. Left ventricular diastolic parameters are consistent with Grade I diastolic dysfunction (impaired relaxation).  Right Ventricle: The right ventricular size is normal. No increase in right ventricular wall thickness. Right  ventricular systolic function is normal. There is normal pulmonary artery systolic pressure. The tricuspid regurgitant velocity is 2.36 m/s, and with an assumed right atrial pressure of 3 mmHg, the estimated right ventricular systolic pressure is 25.3 mmHg.  Left Atrium: Left atrial size was severely dilated.  Right Atrium: Right atrial size was normal in size.  Pericardium: There is no evidence of pericardial effusion.  Mitral Valve: The mitral valve is normal in structure. No evidence of mitral valve regurgitation. No evidence of mitral valve stenosis.  Tricuspid Valve: The tricuspid valve is normal in structure. Tricuspid valve regurgitation is mild . No evidence of tricuspid stenosis.  Aortic Valve: The aortic valve is normal in structure. Aortic valve regurgitation is not visualized. No aortic stenosis is present.  Pulmonic Valve: The pulmonic valve was normal in structure. Pulmonic valve regurgitation is trivial. No evidence of pulmonic stenosis.  Aorta: The aortic root is normal in size and structure.  Venous: The inferior vena cava is normal in size with greater than 50% respiratory variability, suggesting right atrial pressure of 3 mmHg.  IAS/Shunts: No atrial level shunt detected by color flow Doppler.   LEFT VENTRICLE PLAX 2D LVIDd:         5.00 cm   Diastology LVIDs:         3.90 cm   LV e' medial:    4.13 cm/s LV PW:         1.40 cm   LV E/e' medial:  13.0 LV IVS:        1.80 cm   LV e' lateral:   8.81 cm/s LVOT diam:     2.30 cm   LV E/e' lateral: 6.1 LV SV:         61 LV SV Index:   34 LVOT Area:     4.15 cm   RIGHT VENTRICLE             IVC RV S prime:     15.00 cm/s  IVC diam: 1.20 cm TAPSE (M-mode): 1.5 cm RVSP:           25.3 mmHg  LEFT ATRIUM             Index        RIGHT ATRIUM           Index LA diam:        4.70 cm 2.64 cm/m   RA Pressure: 3.00 mmHg LA Vol (A2C):   55.5 ml 31.13 ml/m  RA Area:     14.40 cm LA Vol (A4C):   90.5 ml 50.76 ml/m  RA  Volume:   40.10 ml  22.49 ml/m LA Biplane Vol: 71.1 ml 39.88 ml/m AORTIC VALVE LVOT Vmax:   82.20 cm/s LVOT Vmean:  53.700 cm/s LVOT VTI:    0.148 m  AORTA Ao Root diam: 3.70 cm Ao Asc diam:  3.70 cm  MITRAL VALVE  TRICUSPID VALVE MV Area (PHT): 2.50 cm    TR Peak grad:   22.3 mmHg MV Decel Time: 304 msec    TR Vmax:        236.00 cm/s MV E velocity: 53.50 cm/s  Estimated RAP:  3.00 mmHg MV A velocity: 95.40 cm/s  RVSP:           25.3 mmHg MV E/A ratio:  0.56 SHUNTS Systemic VTI:  0.15 m Systemic Diam: 2.30 cm  Donato Schultz MD Electronically signed by Donato Schultz MD Signature Date/Time: 03/08/2023/1:20:04 PM    Final            Recent Labs: 10/09/2022: ALT 28 10/13/2022: Hemoglobin 14.9; Magnesium 1.8; Platelets 286 10/30/2022: BUN 19; Creatinine, Ser 1.13; Potassium 4.3; Sodium 143  Recent Lipid Panel No results found for: "CHOL", "TRIG", "HDL", "CHOLHDL", "VLDL", "LDLCALC", "LDLDIRECT"  History of Present Illness    72 year old female with the above past medical history including  cardiomyopathy, LVH, hypertension, dyslipidemia, and prediabetes.     Echocardiogram in 2016 showed EF 40 to 45%.  She was hospitalized for hip fracture at the time.  She did not have outpatient follow-up with cardiology.  EF in 2021 was 35% (elevated during hospital admission for sepsis).  She was hospitalized in May 2024 in the setting of resection of meningioma s/p craniotomy.  Repeat echocardiogram showed EF 30 to 35%, moderately decreased LV function, RWMA, moderate LVH, G1 DD, mildly reduced RV systolic function, no significant valvular abnormalities.  She was started on GDMT.  She was last seen in the office on 11/21/2022 and was stable from a cardiac standpoint.  She noted having a yeast infection, initiation of SGLT2 inhibitor was deferred.  Repeat echocardiogram in 02/2023 showed EF 35 to 40%, moderately decreased LV function, LV global hypokinesis, moderate LVH, G1 DD, normal  RV, no significant valvular abnormalities.   She presents today for follow-up accompanied by her daughter.  Since her last visit she has done well from a cardiac standpoint. She notes dependent bilateral lower extremity edema, discoloration to her feet and toes.  She also notes some mild discomfort when walking, concerning for possible claudication, she also describes rest pain at night in her bilateral lower extremities. She denies chest pain, palpitations, dyspnea, PND, orthopnea, weight gain.  She is no longer smoking.  Overall, she reports feeling well.  Home Medications    Current Outpatient Medications  Medication Sig Dispense Refill   albuterol (VENTOLIN HFA) 108 (90 Base) MCG/ACT inhaler Inhale 2 puffs into the lungs every 4 (four) hours as needed for wheezing or shortness of breath. 18 g 3   atorvastatin (LIPITOR) 20 MG tablet Take 1 tablet (20 mg total) by mouth daily. 90 tablet 3   Black Cohosh-SoyIsoflav-Magnol (ESTROVEN MENOPAUSE RELIEF) CAPS      cholecalciferol (VITAMIN D) 1000 UNITS tablet Take 1,000 Units by mouth daily.     clotrimazole-betamethasone (LOTRISONE) cream Apply 1 Application topically 2 (two) times daily.     dexamethasone (DECADRON) 1 MG tablet Take 1 tablet (1 mg total) by mouth daily. 2 tablet 0   Doxylamine Succinate, Sleep, (SLEEP-AID PO) Take 2 tablets by mouth at bedtime.     levETIRAcetam (KEPPRA) 500 MG tablet Take 1 tablet (500 mg total) by mouth 2 (two) times daily. 45 tablet 0   metoprolol succinate (TOPROL XL) 25 MG 24 hr tablet Take 1 tablet (25 mg total) by mouth at bedtime. 90 tablet 3   metoprolol tartrate (LOPRESSOR) 100 MG tablet Take  1 tablet 2 hours prior to cardiac ct 1 tablet 0   Nutritional Supplements (ESTROVEN PO) Take 1 tablet by mouth every evening.     sacubitril-valsartan (ENTRESTO) 97-103 MG Take 1 tablet by mouth 2 (two) times daily. 180 tablet 3   spironolactone (ALDACTONE) 25 MG tablet Take 1 tablet (25 mg total) by mouth daily. 90  tablet 3   traMADol (ULTRAM) 50 MG tablet Take 1 tablet (50 mg total) by mouth 4 (four) times daily as needed for moderate pain or severe pain. 30 tablet 0   vitamin E 400 UNIT capsule Take 400 Units by mouth daily.     zolpidem (AMBIEN) 10 MG tablet Take 10 mg by mouth daily.     dexamethasone (DECADRON) 1 MG tablet Take 1 tablet (1 mg total) by mouth every 12 (twelve) hours. (Patient not taking: Reported on 03/28/2023) 2 tablet 0   Melatonin 10 MG CAPS Take 1 capsule by mouth at bedtime. (Patient not taking: Reported on 03/28/2023)     No current facility-administered medications for this visit.     Review of Systems    She denies chest pain, palpitations, dyspnea, pnd, orthopnea, n, v, dizziness, syncope, weight gain, or early satiety. All other systems reviewed and are otherwise negative except as noted above.   Physical Exam    VS:  BP 120/88   Pulse 67   Ht 5\' 4"  (1.626 m)   Wt 159 lb (72.1 kg)   SpO2 90%   BMI 27.29 kg/m   GEN: Well nourished, well developed, in no acute distress. HEENT: normal. Neck: Supple, no JVD, carotid bruits, or masses. Cardiac: RRR, no murmurs, rubs, or gallops. No clubbing, cyanosis, minimal nonpitting dependent bilateral lower extremity edema.  Radials/DP/PT 2+ and equal bilaterally.  Respiratory:  Respirations regular and unlabored, clear to auscultation bilaterally. GI: Soft, nontender, nondistended, BS + x 4. MS: no deformity or atrophy. Skin: warm and dry, no rash.  Blue/purple discoloration to bilateral feet and toes (dependent). Neuro:  Strength and sensation are intact. Psych: Normal affect.  Accessory Clinical Findings    ECG personally reviewed by me today -    - no EKG in office today.    Lab Results  Component Value Date   WBC 13.5 (H) 10/13/2022   HGB 14.9 10/13/2022   HCT 45.5 10/13/2022   MCV 93.2 10/13/2022   PLT 286 10/13/2022   Lab Results  Component Value Date   CREATININE 1.13 (H) 10/30/2022   BUN 19 10/30/2022    NA 143 10/30/2022   K 4.3 10/30/2022   CL 104 10/30/2022   CO2 25 10/30/2022   Lab Results  Component Value Date   ALT 28 10/09/2022   AST 35 10/09/2022   ALKPHOS 53 10/09/2022   BILITOT 0.6 10/09/2022   No results found for: "CHOL", "HDL", "LDLCALC", "LDLDIRECT", "TRIG", "CHOLHDL"  Lab Results  Component Value Date   HGBA1C 6.0 (H) 02/09/2020    Assessment & Plan    1. Cardiomyopathy/coronary artery calcification on CT: Prior EF as low as 35%.  Echo in 09/2022 showed EF 30 to 35%, moderately decreased LV function, RWMA, moderate LVH, G1 DD, mildly reduced RV systolic function, no significant valvular abnormalities. Repeat echocardiogram in 02/2023 showed EF 35 to 40%, moderately decreased LV function, LV global hypokinesis, moderate LVH, G1 DD, normal RV, no significant valvular abnormalities.  Euvolemic and well compensated on exam.  She is tolerating current GDMT.  Given history of yeast infection/UTI, will defer initiation of  SGLT2 inhibitor.  Can revisit at next follow-up visit. Currently stable with no anginal symptoms. However, given persistently reduced EF, evidence of coronary artery calcification on prior CT, will pursue coronary CT angiogram to rule out ischemic source. Will update BMET today. Continue metoprolol, Entresto, spironolactone.  No indication for loop diuretic.   2. LVH: Likely in the setting of uncontrolled hypertension.  BP is currently well-controlled.  Per Dr. Antoine Poche, low threshold for MRI in the future to rule out infiltrative disease.   3. Hypertension: BP well controlled. Continue current antihypertensive regimen.    4. Dyslipidemia: No recent LDL on file.  She notes that she had labs drawn with her PCP.  Consider repeat labs at follow-up. Continue atorvastatin (consider escalation of lipid-lowering therapy pending coronary CT angiogram results, follow-up labs).   5. Discoloration to bilateral lower extremities/rest pain/intermittent claudication/lower  extremity edema/former tobacco use: She notes dependent bilateral lower extremity edema, dependent blue/purple discoloration to her feet and toes. She also notes some mild discomfort when walking, concerning for possible claudication, she also describes rest pain at night to her bilateral lower extremities. She is no longer smoking.  DP pulses 1+ bilaterally.  Given evidence of CAD on prior CT, history of tobacco use, will rule out PAD/venous insufficiency with ABIs and venous reflux study.  Encouraged elevation as able.   Type 2 diabetes: A1c was 6.7 in 11/2022, technically qualifying as type 2 diabetes. Monitored and managed per PCP.    6. Disposition:  Follow-up in 6-8 weeks with APP, 4-6 months with Dr. Antoine Poche.        Joylene Grapes, NP 03/28/2023, 11:09 AM

## 2023-03-28 ENCOUNTER — Encounter: Payer: Self-pay | Admitting: Nurse Practitioner

## 2023-03-28 ENCOUNTER — Telehealth: Payer: Self-pay

## 2023-03-28 ENCOUNTER — Ambulatory Visit: Payer: 59 | Attending: Nurse Practitioner | Admitting: Nurse Practitioner

## 2023-03-28 VITALS — BP 120/88 | HR 67 | Ht 64.0 in | Wt 159.0 lb

## 2023-03-28 DIAGNOSIS — I517 Cardiomegaly: Secondary | ICD-10-CM

## 2023-03-28 DIAGNOSIS — I10 Essential (primary) hypertension: Secondary | ICD-10-CM | POA: Diagnosis not present

## 2023-03-28 DIAGNOSIS — L819 Disorder of pigmentation, unspecified: Secondary | ICD-10-CM

## 2023-03-28 DIAGNOSIS — R600 Localized edema: Secondary | ICD-10-CM

## 2023-03-28 DIAGNOSIS — I429 Cardiomyopathy, unspecified: Secondary | ICD-10-CM

## 2023-03-28 DIAGNOSIS — I2510 Atherosclerotic heart disease of native coronary artery without angina pectoris: Secondary | ICD-10-CM

## 2023-03-28 DIAGNOSIS — I739 Peripheral vascular disease, unspecified: Secondary | ICD-10-CM

## 2023-03-28 DIAGNOSIS — E785 Hyperlipidemia, unspecified: Secondary | ICD-10-CM

## 2023-03-28 LAB — BASIC METABOLIC PANEL
BUN/Creatinine Ratio: 15 (ref 12–28)
BUN: 17 mg/dL (ref 8–27)
CO2: 28 mmol/L (ref 20–29)
Calcium: 9.6 mg/dL (ref 8.7–10.3)
Chloride: 103 mmol/L (ref 96–106)
Creatinine, Ser: 1.13 mg/dL — ABNORMAL HIGH (ref 0.57–1.00)
Glucose: 78 mg/dL (ref 70–99)
Potassium: 5 mmol/L (ref 3.5–5.2)
Sodium: 142 mmol/L (ref 134–144)
eGFR: 51 mL/min/{1.73_m2} — ABNORMAL LOW (ref >59)

## 2023-03-28 MED ORDER — METOPROLOL TARTRATE 100 MG PO TABS: ORAL_TABLET | ORAL | 0 refills | Status: DC

## 2023-03-28 NOTE — Patient Instructions (Signed)
Medication Instructions:  Metoprolol 100 mg take 1 tablet 2 hours prior to Cardiac CT.  *If you need a refill on your cardiac medications before your next appointment, please call your pharmacy*   Lab Work: BMET today   Testing/Procedures: Your physician has requested that you have an ankle brachial index (ABI). During this test an ultrasound and blood pressure cuff are used to evaluate the arteries that supply the arms and legs with blood. Allow thirty minutes for this exam. There are no restrictions or special instructions.   Your physician has requested that you have a lower or upper extremity venous duplex. This test is an ultrasound of the veins in the legs or arms. It looks at venous blood flow that carries blood from the heart to the legs or arms. Allow one hour for a Lower Venous exam. Allow thirty minutes for an Upper Venous exam. There are no restrictions or special instructions.     Your cardiac CT will be scheduled at one of the below locations:   [facility] [address] [place], [place] [address] [phone]  OR  [facility] [address] [place], [place] [address] [phone]  OR   [facility] [address] [place], [place] [address] [phone]  If scheduled at [facility], please arrive at the [facility] and Children's Entrance (Entrance C2) of [facility] 30 minutes prior to test start time. You can use the FREE valet parking offered at entrance C (encouraged to control the heart rate for the test)  Proceed to the [facility] Radiology Department (first floor) to check-in and test prep.  All radiology patients and guests should use entrance C2 at [facility] accessed from [facility], even though the hospital's physical address listed is [address]    If scheduled at [facility] or [facility], please arrive 15 mins early for check-in and test prep.  There is spacious parking and easy access to the radiology department from the [facility] and Vascular entrance. Please enter here and check-in with the desk attendant.   Please follow these instructions carefully (unless otherwise directed):  An IV will be required for this test and Nitroglycerin will be given.  Hold all erectile dysfunction medications at least 3 days (72 hrs) prior to test. (Ie viagra, cialis, sildenafil, tadalafil, etc)   On the Night Before the Test: Be sure to Drink plenty of water. Do not consume any caffeinated/decaffeinated beverages or chocolate 12 hours prior to your test. Do not take any antihistamines 12 hours prior to your test. If the patient has contrast allergy: Patient will need a prescription for Prednisone and very clear instructions (as follows): Prednisone 50 mg - take 13 hours prior to test Take another Prednisone 50 mg 7 hours prior to test Take another Prednisone 50 mg 1 hour prior to test Take Benadryl 50 mg 1 hour prior to test Patient must complete all four doses of above prophylactic medications. Patient will need a ride after test due to Benadryl.  On the Day of the Test: Drink plenty of water until 1 hour prior to the test. Do not eat any food 1 hour prior to test. You may take your regular medications prior to the test.  Take metoprolol (Lopressor) two hours prior to test. If you take Furosemide/Hydrochlorothiazide/Spironolactone, please HOLD on the morning of the test. FEMALES- please wear underwire-free bra if  available, avoid dresses & tight clothing  *For Clinical Staff only. Please instruct patient the following:* Heart Rate Medication Recommendations for Cardiac CT  Resting HR < 50 bpm  No medication  Resting HR 50-60 bpm and BP >110/50 mmHG   Consider Metoprolol tartrate 25 mg PO 90-120 min prior to scan  Resting HR 60-65 bpm and BP >110/50 mmHG   Metoprolol tartrate 50 mg PO 90-120 minutes prior to scan   Resting HR > 65 bpm and BP >110/50 mmHG  Metoprolol tartrate 100 mg PO 90-120 minutes prior to scan  Consider Ivabradine 10-15 mg PO or a calcium channel blocker for resting HR >60 bpm and contraindication to metoprolol tartrate  Consider Ivabradine 10-15 mg PO in combination with metoprolol tartrate for HR >80 bpm        After the Test: Drink plenty of water. After receiving IV contrast, you may experience a mild flushed feeling. This is normal. On occasion, you may experience a mild rash up to 24 hours after the test. This is not dangerous. If this occurs, you can take Benadryl 25 mg and increase your fluid intake. If you experience trouble breathing, this can be serious. If it is severe call 911 IMMEDIATELY. If it is mild, please call our office. If you take any of these medications: Glipizide/Metformin, Avandament, Glucavance, please do not take 48 hours after completing test unless otherwise instructed.  We will call to schedule your test 2-4 weeks out understanding that some insurance companies will need an authorization prior to the service being performed.   For more information and frequently asked questions, please visit our website : [email or web address]  For non-scheduling related questions, please contact the cardiac imaging nurse navigator should you have any questions/concerns: Cardiac Imaging Nurse Navigators Direct Office Dial: [phone]   For scheduling needs, including cancellations and rescheduling, please call [place], [phone].    Follow-Up: At [facility], you and your health needs are our priority.  As part of our continuing mission to provide you with exceptional heart care, we have created designated Provider Care Teams.  These Care Teams include your primary Cardiologist (physician) and Advanced Practice Providers (APPs -  Physician Assistants and Nurse Practitioners) who  all work together to provide you with the care you need, when you need it.  We recommend signing up for the patient portal called "MyChart".  Sign up information is provided on this After Visit Summary.  MyChart is used to connect with patients for Virtual Visits (Telemedicine).  Patients are able to view lab/test results, encounter notes, upcoming appointments, etc.  Non-urgent messages can be sent to your provider as well.   To learn more about what you can do with MyChart, go to [email or web address].    Your next appointment:    6-8 weeks Bernadene Person NP) 4-6 months (Dr. Antoine Poche)  Provider:   Rollene Rotunda, MD  or Bernadene Person, NP        Other Instructions

## 2023-03-29 NOTE — Telephone Encounter (Signed)
Discussed results at pts apt yesterday 03/28/23.

## 2023-04-09 ENCOUNTER — Ambulatory Visit (HOSPITAL_BASED_OUTPATIENT_CLINIC_OR_DEPARTMENT_OTHER)
Admission: RE | Admit: 2023-04-09 | Discharge: 2023-04-09 | Disposition: A | Discharge status: Home / Self Care | Payer: 59 | Source: Ambulatory Visit | Attending: Cardiovascular Disease | Admitting: Cardiovascular Disease

## 2023-04-09 ENCOUNTER — Ambulatory Visit (HOSPITAL_COMMUNITY)
Admission: RE | Admit: 2023-04-09 | Discharge: 2023-04-09 | Disposition: A | Discharge status: Home / Self Care | Payer: 59 | Source: Ambulatory Visit | Attending: Cardiovascular Disease | Admitting: Cardiovascular Disease

## 2023-04-09 DIAGNOSIS — L819 Disorder of pigmentation, unspecified: Secondary | ICD-10-CM | POA: Diagnosis present

## 2023-04-09 DIAGNOSIS — I739 Peripheral vascular disease, unspecified: Secondary | ICD-10-CM

## 2023-04-09 DIAGNOSIS — R600 Localized edema: Secondary | ICD-10-CM

## 2023-04-10 ENCOUNTER — Telehealth (HOSPITAL_COMMUNITY): Payer: Self-pay | Admitting: *Deleted

## 2023-04-10 LAB — VAS US ABI WITH/WO TBI
Left ABI: 1.24
Right ABI: 1.23

## 2023-04-10 NOTE — Telephone Encounter (Signed)
Attempted to call patient regarding upcoming cardiac CT appointment. °Left message on voicemail with name and callback number ° °Edie Vallandingham RN Navigator Cardiac Imaging °[facility] °[phone] Office °[phone] Cell ° °

## 2023-04-10 NOTE — Telephone Encounter (Signed)
Patient returning call about her upcoming cardiac imaging study; pt verbalizes understanding of appt date/time, parking situation and where to check in, pre-test NPO status and medications ordered, and verified current allergies; name and call back number provided for further questions should they arise  Larey Brick RN Navigator Cardiac Imaging [facility] [phone] office [phone] cell   Patient to hold her evening metoprolol succiante the night before her study. She will take 100mg  metoprolol tartrate two hours prior to her cardiac CT scan. She is aware to arrive at 12 pm.

## 2023-04-11 ENCOUNTER — Ambulatory Visit (HOSPITAL_COMMUNITY)
Admission: RE | Admit: 2023-04-11 | Discharge: 2023-04-11 | Disposition: A | Discharge status: Home / Self Care | Payer: 59 | Source: Ambulatory Visit | Attending: Nurse Practitioner | Admitting: Nurse Practitioner

## 2023-04-11 DIAGNOSIS — I429 Cardiomyopathy, unspecified: Secondary | ICD-10-CM | POA: Diagnosis not present

## 2023-04-11 MED ORDER — NITROGLYCERIN 0.4 MG SL SUBL
0.8000 mg | SUBLINGUAL_TABLET | Freq: Once | SUBLINGUAL | Status: AC
  Administered 2023-04-11: 0.8 mg via SUBLINGUAL

## 2023-04-11 MED ORDER — NITROGLYCERIN 0.4 MG SL SUBL
SUBLINGUAL_TABLET | SUBLINGUAL | Status: AC
  Filled 2023-04-11: qty 2

## 2023-04-11 MED ORDER — METOPROLOL TARTRATE 5 MG/5ML IV SOLN
INTRAVENOUS | Status: AC
  Filled 2023-04-11: qty 10

## 2023-04-11 MED ORDER — IOHEXOL 350 MG/ML SOLN
100.0000 mL | Freq: Once | INTRAVENOUS | Status: AC | PRN
  Administered 2023-04-11: 100 mL via INTRAVENOUS

## 2023-04-11 MED ORDER — METOPROLOL TARTRATE 5 MG/5ML IV SOLN
5.0000 mg | INTRAVENOUS | Status: DC | PRN
  Administered 2023-04-11: 5 mg via INTRAVENOUS

## 2023-05-14 ENCOUNTER — Ambulatory Visit: Payer: 59 | Attending: Nurse Practitioner | Admitting: Nurse Practitioner

## 2023-05-14 ENCOUNTER — Encounter: Payer: Self-pay | Admitting: Nurse Practitioner

## 2023-05-14 VITALS — BP 132/62 | Ht 65.0 in | Wt 158.6 lb

## 2023-05-14 DIAGNOSIS — I517 Cardiomegaly: Secondary | ICD-10-CM | POA: Diagnosis not present

## 2023-05-14 DIAGNOSIS — I2510 Atherosclerotic heart disease of native coronary artery without angina pectoris: Secondary | ICD-10-CM | POA: Diagnosis not present

## 2023-05-14 DIAGNOSIS — L819 Disorder of pigmentation, unspecified: Secondary | ICD-10-CM

## 2023-05-14 DIAGNOSIS — E118 Type 2 diabetes mellitus with unspecified complications: Secondary | ICD-10-CM

## 2023-05-14 DIAGNOSIS — I10 Essential (primary) hypertension: Secondary | ICD-10-CM | POA: Diagnosis not present

## 2023-05-14 DIAGNOSIS — E785 Hyperlipidemia, unspecified: Secondary | ICD-10-CM

## 2023-05-14 DIAGNOSIS — I429 Cardiomyopathy, unspecified: Secondary | ICD-10-CM

## 2023-05-14 MED ORDER — SPIRONOLACTONE 25 MG PO TABS: 25.0000 mg | ORAL_TABLET | Freq: Every day | ORAL | 3 refills | Status: DC

## 2023-05-14 NOTE — Progress Notes (Signed)
Office Visit    Patient Name: Penny Hicks Date of Encounter: 05/14/2023  Primary Care Provider:  Assunta Found, MD Primary Cardiologist:  Rollene Rotunda, MD  Chief Complaint    72 year old female with a history of CAD, NICM, LVH, hypertension, dyslipidemia, type 2 diabetes, COPD and meningioma s/p craniotomy who presents for follow-up related to cardiomyopathy.   Past Medical History    Past Medical History:  Diagnosis Date   Arthritis    COPD (chronic obstructive pulmonary disease) (HCC)    Hypercholesterolemia    Hypertension    Hypothyroidism    Past Surgical History:  Procedure Laterality Date   APPLICATION OF CRANIAL NAVIGATION Left 10/10/2022   Procedure: APPLICATION OF CRANIAL NAVIGATION;  Surgeon: Dawley, Alan Mulder, DO;  Location: [facility] OR;  Service: Neurosurgery;  Laterality: Left;   CESAREAN SECTION  x 2   CRANIOTOMY Left 10/10/2022   Procedure: LEFT CRANIOTOMY TUMOR EXCISION;  Surgeon: Bethann Goo, DO;  Location: [facility] OR;  Service: Neurosurgery;  Laterality: Left;   HIP PINNING,CANNULATED Right 05/25/2015   Procedure: CANNULATED HIP PINNING;  Surgeon: Jodi Geralds, MD;  Location: [facility] OR;  Service: Orthopedics;  Laterality: Right;   RADIOLOGY WITH ANESTHESIA N/A 10/08/2022   Procedure: MRI WITH ANESTHESIA;  Surgeon: Radiologist, Medication, MD;  Location: [facility] OR;  Service: Radiology;  Laterality: N/A;   TONSILLECTOMY      Allergies  No Known Allergies   Labs/Other Studies Reviewed    The following studies were reviewed today:  Cardiac Studies & Procedures       ECHOCARDIOGRAM  ECHOCARDIOGRAM COMPLETE 03/08/2023  Narrative ECHOCARDIOGRAM REPORT    Patient Name:   Penny Hicks Date of Exam: 03/08/2023 Medical Rec #:  [id number]         Height:       65.0 in Accession #:    [id number]        Weight:       156.6 lb Date of Birth:  November 18, 1950          BSA:          1.783 m Patient Age:    72 years          BP:           118/72 mmHg Patient Gender: F                  HR:           74 bpm. Exam Location:  [address]  Procedure: 2D Echo, Cardiac Doppler and Color Doppler  Indications:    I42.9 Cardiomyopathy  History:        Patient has prior history of Echocardiogram examinations, most recent 10/09/2022. Cardiomyopathy, LVH and CHF, COPD; Risk Factors:Hypertension and Dyslipidemia.  Sonographer:    Samule Ohm RDCS Referring Phys: [id number] Greco Gastelum C Keniyah Gelinas  IMPRESSIONS   1. Left ventricular ejection fraction, by estimation, is 35 to 40%. The left ventricle has moderately decreased function. The left ventricle demonstrates global hypokinesis. There is moderate left ventricular hypertrophy. Left ventricular diastolic parameters are consistent with Grade I diastolic dysfunction (impaired relaxation). 2. Right ventricular systolic function is normal. The right ventricular size is normal. There is normal pulmonary artery systolic pressure. The estimated right ventricular systolic pressure is 25.3 mmHg. 3. Left atrial size was severely dilated. 4. The mitral valve is normal in structure. No evidence of mitral valve regurgitation. No evidence of mitral stenosis. 5. The aortic valve is normal in structure. Aortic valve regurgitation is not visualized.  No aortic stenosis is present. 6. The inferior vena cava is normal in size with greater than 50% respiratory variability, suggesting right atrial pressure of 3 mmHg.  Comparison(s): EF prior 30-35%.  FINDINGS Left Ventricle: Left ventricular ejection fraction, by estimation, is 35 to 40%. The left ventricle has moderately decreased function. The left ventricle demonstrates global hypokinesis. The left ventricular internal cavity size was normal in size. There is moderate left ventricular hypertrophy. Left ventricular diastolic parameters are consistent with Grade I diastolic dysfunction (impaired relaxation).  Right Ventricle: The right ventricular size is normal. No increase in right ventricular  wall thickness. Right ventricular systolic function is normal. There is normal pulmonary artery systolic pressure. The tricuspid regurgitant velocity is 2.36 m/s, and with an assumed right atrial pressure of 3 mmHg, the estimated right ventricular systolic pressure is 25.3 mmHg.  Left Atrium: Left atrial size was severely dilated.  Right Atrium: Right atrial size was normal in size.  Pericardium: There is no evidence of pericardial effusion.  Mitral Valve: The mitral valve is normal in structure. No evidence of mitral valve regurgitation. No evidence of mitral valve stenosis.  Tricuspid Valve: The tricuspid valve is normal in structure. Tricuspid valve regurgitation is mild . No evidence of tricuspid stenosis.  Aortic Valve: The aortic valve is normal in structure. Aortic valve regurgitation is not visualized. No aortic stenosis is present.  Pulmonic Valve: The pulmonic valve was normal in structure. Pulmonic valve regurgitation is trivial. No evidence of pulmonic stenosis.  Aorta: The aortic root is normal in size and structure.  Venous: The inferior vena cava is normal in size with greater than 50% respiratory variability, suggesting right atrial pressure of 3 mmHg.  IAS/Shunts: No atrial level shunt detected by color flow Doppler.   LEFT VENTRICLE PLAX 2D LVIDd:         5.00 cm   Diastology LVIDs:         3.90 cm   LV e' medial:    4.13 cm/s LV PW:         1.40 cm   LV E/e' medial:  13.0 LV IVS:        1.80 cm   LV e' lateral:   8.81 cm/s LVOT diam:     2.30 cm   LV E/e' lateral: 6.1 LV SV:         61 LV SV Index:   34 LVOT Area:     4.15 cm   RIGHT VENTRICLE             IVC RV S prime:     15.00 cm/s  IVC diam: 1.20 cm TAPSE (M-mode): 1.5 cm RVSP:           25.3 mmHg  LEFT ATRIUM             Index        RIGHT ATRIUM           Index LA diam:        4.70 cm 2.64 cm/m   RA Pressure: 3.00 mmHg LA Vol (A2C):   55.5 ml 31.13 ml/m  RA Area:     14.40 cm LA Vol (A4C):    90.5 ml 50.76 ml/m  RA Volume:   40.10 ml  22.49 ml/m LA Biplane Vol: 71.1 ml 39.88 ml/m AORTIC VALVE LVOT Vmax:   82.20 cm/s LVOT Vmean:  53.700 cm/s LVOT VTI:    0.148 m  AORTA Ao Root diam: 3.70 cm Ao Asc diam:  3.70  cm  MITRAL VALVE               TRICUSPID VALVE MV Area (PHT): 2.50 cm    TR Peak grad:   22.3 mmHg MV Decel Time: 304 msec    TR Vmax:        236.00 cm/s MV E velocity: 53.50 cm/s  Estimated RAP:  3.00 mmHg MV A velocity: 95.40 cm/s  RVSP:           25.3 mmHg MV E/A ratio:  0.56 SHUNTS Systemic VTI:  0.15 m Systemic Diam: 2.30 cm  Donato Schultz MD Electronically signed by Donato Schultz MD Signature Date/Time: 03/08/2023/1:20:04 PM    Final     CT SCANS  CT CORONARY MORPH W/CTA COR W/SCORE 04/11/2023  Addendum 05/04/2023  7:15 PM ADDENDUM REPORT: 05/04/2023 19:13  EXAM: OVER-READ INTERPRETATION  CT CHEST  The following report is an over-read performed by radiologist Dr. Curly Shores [facility] on 05/04/2023. This over-read does not include interpretation of cardiac or coronary anatomy or pathology. The coronary CTA interpretation by the cardiologist is attached.  COMPARISON:  10/06/2022.  FINDINGS: Cardiovascular: Ascending thoracic aorta ectatic, 3.5 cm. Main pulmonary artery ectatic, 3.6 cm. See findings discussed in the body of the report.  Mediastinum/Nodes: No suspicious adenopathy identified. Imaged mediastinal structures are unremarkable.  Lungs/Pleura: Imaged lungs are clear. No pleural effusion or pneumothorax.  Upper Abdomen: Partially imaged cystic lesion in the liver. Small hiatal hernia.  Musculoskeletal: No chest wall abnormality. Unchanged midthoracic compression deformity. No acute osseous findings.  IMPRESSION: 1. Small hiatal hernia. 2. Partially imaged cystic lesion in the liver.   Electronically Signed By: Layla Maw M.D. On: 05/04/2023  19:13  Narrative HISTORY: cardiomyopathy  EXAM: Cardiac/Coronary  CT  TECHNIQUE: The patient was scanned on a Office manager.  PROTOCOL: A 120 kV prospective scan was triggered in the descending thoracic aorta at 111 HU's. Axial non-contrast 3 mm slices were carried out through the heart. The data set was analyzed on a dedicated work station and scored using the Agatston method. Gantry rotation speed was 250 msecs and collimation was .6 mm. Beta blockade and 0.8 mg of sl NTG was given. The 3D data set was reconstructed in 5% intervals of the 35-75 % of the R-R cycle. Systolic and diastolic phases were analyzed on a dedicated work station using MPR, MIP and VRT modes. The patient received contrast: OMNIPAQUE IOHEXOL 350 MG/ML SOLN.  FINDINGS: Image quality: Good  Noise artifact is: Limited  Coronary calcium score is 65, which places the patient in the 58th percentile for age and sex matched control.  Coronary arteries: Normal coronary origins.  Right dominance.  Right Coronary Artery: Mild plaque in the mid RCA, 25-49% stenosis.  Left Main Coronary Artery: Minimal mixed plaque proximal and distal LM, <25% stenosis.  Left Anterior Descending Coronary Artery: Minimal ostial LAD plaque, <25% stenosis.  Left Circumflex Artery: Minimal ostial LCx plaque, <25% stenosis.  Aorta: Normal size, 36 mm at the mid ascending aorta (level of the PA bifurcation) measured double oblique.  Aortic Valve: Trivial calcifications. AV calcium score 65. Tricuspid aortic valve.  Other findings:  Normal pulmonary vein drainage into the left atrium.  Normal left atrial appendage without thrombus.  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  Please see separate report from [facility] for  non-cardiac findings.  IMPRESSION:  1. Mild CAD in the mid RCA, 25-49% stenosis, CADRADS 2.  2. Total plaque volume 132 mm3 which is 35th percentile for age- and sex-matched controls (calcified plaque 11 mm3; non-calcified plaque 121 mm3). TPV is moderate.  3. Coronary calcium score is 65, which places the patient in the 58th percentile for age and sex matched control.  4. Normal coronary origins with right dominance.  5. 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  6. 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  RECOMMENDATIONS: CAD-RADS 2. Mild non-obstructive CAD (25-49%). Consider non-atherosclerotic causes of chest pain. Consider preventive therapy and risk factor modification.  Electronically Signed: By: Weston Brass M.D. On: 04/11/2023 16:54         Recent Labs: 10/09/2022: ALT 28 10/13/2022: Hemoglobin 14.9; Magnesium 1.8; Platelets 286 03/28/2023: BUN 17; Creatinine, Ser 1.13; Potassium 5.0; Sodium 142  Recent Lipid Panel No results found for: "CHOL", "TRIG", "HDL", "CHOLHDL", "VLDL", "LDLCALC", "LDLDIRECT"  History of Present Illness    72 year old female with the above past medical history including CAD, NICM, LVH, hypertension, dyslipidemia, type 2 diabetes, COPD and meningioma s/p craniotomy.      Echocardiogram in 2016 showed EF 40 to 45%.  She was hospitalized for hip fracture at the time.  She did not have outpatient follow-up with cardiology.  EF in 2021 was 35% (elevated during hospital admission for sepsis).  She was hospitalized in May 2024 in the setting of resection of meningioma s/p craniotomy.  Repeat echocardiogram showed EF 30 to 35%, moderately decreased LV function, RWMA, moderate LVH, G1 DD, mildly reduced RV systolic function, no significant valvular abnormalities.  She was started on GDMT.  Repeat echocardiogram in 02/2023 showed EF 35 to 40%,  moderately decreased LV function, LV global hypokinesis, moderate LVH, G1 DD, normal RV, no significant valvular abnormalities.  She was last seen in the office on 03/28/2023 and was stable from a cardiac standpoint.  She noted dependent bilateral lower extremity edema, gives discoloration to her feet and toes, rest pain to bilateral lower extremities. ABIs and venous reflux study in 03/2023 were normal. Coronary CT angiogram in 03/2023 in the setting of persistently reduced EF, coronary artery calcification noted on prior CT, revealed coronary calcium score of 65 (50th percentile), mild nonobstructive CAD.     She presents today for follow-up accompanied by her daughter.  Since her last visit she has done well from a cardiac standpoint.  She does note some mild dyspnea with significant exertion in the setting of COPD, worse with the cold weather.  She has dependent nonpitting bilateral lower extremity edema, unchanged from prior visits.  She denies chest pain, palpitations, PND, orthopnea, weight gain. Overall, she reports feeling well.   Home Medications    Current Outpatient Medications  Medication Sig Dispense Refill   Acetaminophen (TYLENOL PO) Take by mouth as needed.     albuterol (VENTOLIN HFA) 108 (90 Base) MCG/ACT inhaler Inhale 2 puffs into the lungs every 4 (four) hours as needed for wheezing or shortness of breath. 18 g 3   atorvastatin (LIPITOR) 20 MG tablet Take 1 tablet (20 mg total) by mouth daily. 90 tablet 3   Black Cohosh-SoyIsoflav-Magnol (ESTROVEN MENOPAUSE RELIEF) CAPS      cholecalciferol (VITAMIN D) 1000 UNITS tablet Take 1,000 Units by mouth daily.     clotrimazole-betamethasone (LOTRISONE) cream Apply 1 Application topically 2 (two) times daily.     dexamethasone (DECADRON) 1 MG  tablet Take 1 tablet (1 mg total) by mouth daily. 2 tablet 0   metoprolol succinate (TOPROL XL) 25 MG 24 hr tablet Take 1 tablet (25 mg total) by mouth at bedtime. 90 tablet 3   metoprolol tartrate  (LOPRESSOR) 100 MG tablet Take 1 tablet 2 hours prior to cardiac ct 1 tablet 0   sacubitril-valsartan (ENTRESTO) 97-103 MG Take 1 tablet by mouth 2 (two) times daily. 180 tablet 3   traMADol (ULTRAM) 50 MG tablet Take 1 tablet (50 mg total) by mouth 4 (four) times daily as needed for moderate pain or severe pain. 30 tablet 0   vitamin E 400 UNIT capsule Take 400 Units by mouth daily.     zolpidem (AMBIEN) 10 MG tablet Take 10 mg by mouth daily.     dexamethasone (DECADRON) 1 MG tablet Take 1 tablet (1 mg total) by mouth every 12 (twelve) hours. (Patient not taking: Reported on 05/14/2023) 2 tablet 0   Doxylamine Succinate, Sleep, (SLEEP-AID PO) Take 2 tablets by mouth at bedtime. (Patient not taking: Reported on 05/14/2023)     levETIRAcetam (KEPPRA) 500 MG tablet Take 1 tablet (500 mg total) by mouth 2 (two) times daily. (Patient not taking: Reported on 05/14/2023) 45 tablet 0   Melatonin 10 MG CAPS Take 1 capsule by mouth at bedtime. (Patient not taking: Reported on 05/14/2023)     Nutritional Supplements (ESTROVEN PO) Take 1 tablet by mouth every evening. (Patient not taking: Reported on 05/14/2023)     spironolactone (ALDACTONE) 25 MG tablet Take 1 tablet (25 mg total) by mouth daily. 90 tablet 3   No current facility-administered medications for this visit.     Review of Systems   She denies chest pain, palpitations, pnd, orthopnea, n, v, dizziness, syncope, weight gain, or early satiety. All other systems reviewed and are otherwise negative except as noted above.   Physical Exam    VS:  BP 132/62 (BP Location: Left Arm, Patient Position: Sitting, Cuff Size: Normal)   Ht 5\' 5"  (1.651 m)   Wt 158 lb 9.6 oz (71.9 kg)   SpO2 97%   BMI 26.39 kg/m   GEN: Well nourished, well developed, in no acute distress. HEENT: normal. Neck: Supple, no JVD, carotid bruits, or masses. Cardiac: RRR, no murmurs, rubs, or gallops. No clubbing, cyanosis, edema.  Radials/DP/PT 2+ and equal bilaterally.   Respiratory:  Respirations regular and unlabored, clear to auscultation bilaterally. GI: Soft, nontender, nondistended, BS + x 4. MS: no deformity or atrophy. Skin: warm and dry, no rash. Neuro:  Strength and sensation are intact. Psych: Normal affect.  Accessory Clinical Findings    ECG personally reviewed by me today -    - no EKG in office today.   Lab Results  Component Value Date   WBC 13.5 (H) 10/13/2022   HGB 14.9 10/13/2022   HCT 45.5 10/13/2022   MCV 93.2 10/13/2022   PLT 286 10/13/2022   Lab Results  Component Value Date   CREATININE 1.13 (H) 03/28/2023   BUN 17 03/28/2023   NA 142 03/28/2023   K 5.0 03/28/2023   CL 103 03/28/2023   CO2 28 03/28/2023   Lab Results  Component Value Date   ALT 28 10/09/2022   AST 35 10/09/2022   ALKPHOS 53 10/09/2022   BILITOT 0.6 10/09/2022   No results found for: "CHOL", "HDL", "LDLCALC", "LDLDIRECT", "TRIG", "CHOLHDL"  Lab Results  Component Value Date   HGBA1C 6.0 (H) 02/09/2020    Assessment &  Plan    1. Cardiomyopathy/CAD: Prior EF as low as 35%.  Echo in 09/2022 showed EF 30 to 35%, moderately decreased LV function, RWMA, moderate LVH, G1 DD, mildly reduced RV systolic function, no significant valvular abnormalities. Repeat echocardiogram in 02/2023 showed EF 35 to 40%, moderately decreased LV function, LV global hypokinesis, moderate LVH, G1 DD, normal RV, no significant valvular abnormalities.  Euvolemic and well compensated on exam.  She is tolerating current GDMT.  Coronary CT angiogram in 03/2023 in the setting of persistently reduced EF, coronary artery calcification noted on prior CT, revealed coronary calcium score of 65 (50th percentile), mild nonobstructive CAD.  Stable with no anginal symptoms. Continue metoprolol, Entresto, spironolactone. Given history of yeast infection/UTI, will defer initiation of SGLT2 inhibitor.  No indication for loop diuretic at this time.   2. LVH: Likely in the setting of uncontrolled  hypertension.  BP is currently well-controlled.  Per Dr. Antoine Poche, low threshold for MRI in the future to rule out infiltrative disease, can discuss with him at next follow-up visit.   3. Hypertension: BP well controlled. Continue current antihypertensive regimen.    4. Dyslipidemia: No recent LDL on file.  Will repeat fasting lipid panel, CMET today. Continue atorvastatin (consider escalation of lipid-lowering therapy given CAD pending labs).   5. Discoloration to bilateral lower extremities/rest pain/intermittent claudication/lower extremity edema/former tobacco use: History of dependent bilateral lower extremity edema, dependent blue/purple discoloration to her feet and toes. She also notes some mild discomfort when walking, rest pain at night to her bilateral lower extremities. ABIs and venous reflux study in 03/2023 were normal.  Encouraged elevation as able.    6. Type 2 diabetes: A1c was 6.7 in 11/2022, technically qualifying as type 2 diabetes. Monitored and managed per PCP.    7. Disposition:  Follow-up as scheduled with Dr. Antoine Poche in 08/2023.        Joylene Grapes, NP 05/14/2023, 10:13 AM

## 2023-05-14 NOTE — Patient Instructions (Signed)
Medication Instructions:  Your physician recommends that you continue on your current medications as directed. Please refer to the Current Medication list given to you today.  *If you need a refill on your cardiac medications before your next appointment, please call your pharmacy*   Lab Work: Fasting Lipid panel, CMET today  Testing/Procedures: NONE ordered at this time of appointment     Follow-Up: At [facility], you and your health needs are our priority.  As part of our continuing mission to provide you with exceptional heart care, we have created designated Provider Care Teams.  These Care Teams include your primary Cardiologist (physician) and Advanced Practice Providers (APPs -  Physician Assistants and Nurse Practitioners) who all work together to provide you with the care you need, when you need it.  We recommend signing up for the patient portal called "MyChart".  Sign up information is provided on this After Visit Summary.  MyChart is used to connect with patients for Virtual Visits (Telemedicine).  Patients are able to view lab/test results, encounter notes, upcoming appointments, etc.  Non-urgent messages can be sent to your provider as well.   To learn more about what you can do with MyChart, go to [email or web address].    Your next appointment:    Keep March 2025 apt  Provider:   Rollene Rotunda, MD     Other Instructions

## 2023-05-15 ENCOUNTER — Telehealth: Payer: Self-pay | Admitting: *Deleted

## 2023-05-15 DIAGNOSIS — E875 Hyperkalemia: Secondary | ICD-10-CM

## 2023-05-15 DIAGNOSIS — E785 Hyperlipidemia, unspecified: Secondary | ICD-10-CM

## 2023-05-15 LAB — COMPREHENSIVE METABOLIC PANEL
ALT: 10 [IU]/L (ref 0–32)
AST: 16 [IU]/L (ref 0–40)
Albumin: 4 g/dL (ref 3.8–4.8)
Alkaline Phosphatase: 80 [IU]/L (ref 44–121)
BUN/Creatinine Ratio: 19 (ref 12–28)
BUN: 24 mg/dL (ref 8–27)
Bilirubin Total: 0.4 mg/dL (ref 0.0–1.2)
CO2: 25 mmol/L (ref 20–29)
Calcium: 9.6 mg/dL (ref 8.7–10.3)
Chloride: 102 mmol/L (ref 96–106)
Creatinine, Ser: 1.27 mg/dL — ABNORMAL HIGH (ref 0.57–1.00)
Globulin, Total: 2.6 g/dL (ref 1.5–4.5)
Glucose: 100 mg/dL — ABNORMAL HIGH (ref 70–99)
Potassium: 5.3 mmol/L — ABNORMAL HIGH (ref 3.5–5.2)
Sodium: 143 mmol/L (ref 134–144)
Total Protein: 6.6 g/dL (ref 6.0–8.5)
eGFR: 45 mL/min/{1.73_m2} — ABNORMAL LOW (ref >59)

## 2023-05-15 LAB — LIPID PANEL
Chol/HDL Ratio: 3.4 {ratio} (ref 0.0–4.4)
Cholesterol, Total: 162 mg/dL (ref 100–199)
HDL: 47 mg/dL (ref >39)
LDL Chol Calc (NIH): 89 mg/dL (ref 0–99)
Triglycerides: 148 mg/dL (ref 0–149)
VLDL Cholesterol Cal: 26 mg/dL (ref 5–40)

## 2023-05-15 MED ORDER — ATORVASTATIN CALCIUM 40 MG PO TABS: 40.0000 mg | ORAL_TABLET | Freq: Every day | ORAL | 3 refills | Status: DC

## 2023-05-15 NOTE — Telephone Encounter (Signed)
Joylene Grapes, NP     Recent labs show overall stable kidney function.  Potassium was mildly elevated.  Would recommend decreasing spironolactone to 12.5 mg daily.  Repeat BMET in 2 weeks.  Cholesterol was elevated above goal.  Would recommend increasing Lipitor to 40 mg daily if agreeable with plans to repeat fasting lipid panel, CMET in 2 months.  Otherwise, continue current medications and follow-up as planned.  Thank you-EM     pt aware of results  New script sent to the pharmacy  Lab orders mailed to the pt

## 2023-05-15 NOTE — Telephone Encounter (Signed)
Noted  

## 2023-05-30 LAB — BASIC METABOLIC PANEL
BUN/Creatinine Ratio: 17 (ref 12–28)
BUN: 22 mg/dL (ref 8–27)
CO2: 27 mmol/L (ref 20–29)
Calcium: 9.9 mg/dL (ref 8.7–10.3)
Chloride: 102 mmol/L (ref 96–106)
Creatinine, Ser: 1.26 mg/dL — ABNORMAL HIGH (ref 0.57–1.00)
Glucose: 105 mg/dL — ABNORMAL HIGH (ref 70–99)
Potassium: 4.7 mmol/L (ref 3.5–5.2)
Sodium: 143 mmol/L (ref 134–144)
eGFR: 45 mL/min/{1.73_m2} — ABNORMAL LOW (ref >59)

## 2023-05-31 ENCOUNTER — Telehealth: Payer: Self-pay

## 2023-05-31 NOTE — Telephone Encounter (Signed)
Spoke with pt and pts granddaughter. They were notified of lab results. Pt will continue current medication and f/u as planned.

## 2023-08-14 NOTE — Progress Notes (Unsigned)
  Cardiology Office Note:   Date:  08/16/2023  ID:  Penny Hicks, DOB 10-12-1950, MRN [id number] PCP: Assunta Found, MD  [facility] Providers Cardiologist:  Rollene Rotunda, MD {  History of Present Illness:   Penny Hicks is a 73 y.o. female has a past history of a dilated cardiomyopathy. This was found in 2016 when her EF was 40 to 45%. She was hospitalized for hip fracture at that time but there was no outpatient follow-up. She was treated with ACE inhibitors and beta-blockers. In 2021 her EF was 35% when she was admitted with sepsis. She had coronary CT angiogram in 03/2023 in the setting of persistently reduced EF, coronary artery calcification noted on prior CT, revealed coronary calcium score of 65 (50th percentile), mild nonobstructive CAD.  In May of last year she had craniotomy for resection of meningioma.  She has done well since we last saw her. The patient denies any new symptoms such as chest discomfort, neck or arm discomfort. There has been no new shortness of breath, PND or orthopnea. There have been no reported palpitations, presyncope or syncope.     ROS: As stated in the HPI and negative for all other systems.  Studies Reviewed:    EKG:   EKG Interpretation Date/Time:  Thursday August 16 2023 10:07:38 EST Ventricular Rate:  76 PR Interval:  224 QRS Duration:  112 QT Interval:  382 QTC Calculation: 429 R Axis:   18  Text Interpretation: Sinus rhythm with 1st degree A-V block with frequent Premature ventricular complexes Possible Left atrial enlargement Minimal voltage criteria for LVH, may be normal variant ( Cornell product ) When compared with ECG of 09-Oct-2022 18:44, Premature ventricular complexes are now Present Confirmed by Rollene Rotunda ([id number]) on 08/16/2023 10:27:58 AM    Risk Assessment/Calculations:              Physical Exam:   VS:  BP 122/70   Pulse 76   Ht 5\' 5"  (1.651 m)   Wt 162 lb 6.4 oz (73.7 kg)   SpO2 91%   BMI 27.02 kg/m     Wt Readings from Last 3 Encounters:  08/16/23 162 lb 6.4 oz (73.7 kg)  05/14/23 158 lb 9.6 oz (71.9 kg)  03/28/23 159 lb (72.1 kg)     GEN: Well nourished, well developed in no acute distress NECK: No JVD; No carotid bruits CARDIAC: RRR, no murmurs, rubs, gallops RESPIRATORY:  Clear to auscultation without rales, wheezing or rhonchi  ABDOMEN: Soft, non-tender, non-distended EXTREMITIES:  No edema; No deformity   ASSESSMENT AND PLAN:   Cardiomyopathy:   She is tolerating the meds as listed and likely would not tolerate a higher dose of beta-blocker.  No med titration.  I will check a PYP scan.  I do not suspect an infiltrative cardiomyopathy however.  I suspect this is related to hypertension and she currently has class I symptoms.  I will repeat an echocardiogram in September.  LVH: This will be evaluated as above.   Dyslipidemia: LDL was 89.  She has some aortic atherosclerosis and so I think the goal LDL is close to the 70s.    HTN:  This is at target.  No change in therapy.\  PVCs: Patient does not feel these.  No change in therapy.        Follow up with APP in 6 months  Signed, Rollene Rotunda, MD

## 2023-08-16 ENCOUNTER — Ambulatory Visit: Payer: 59 | Attending: Cardiology | Admitting: Cardiology

## 2023-08-16 ENCOUNTER — Encounter: Payer: Self-pay | Admitting: Cardiology

## 2023-08-16 VITALS — BP 122/70 | HR 76 | Ht 65.0 in | Wt 162.4 lb

## 2023-08-16 DIAGNOSIS — E785 Hyperlipidemia, unspecified: Secondary | ICD-10-CM

## 2023-08-16 DIAGNOSIS — I517 Cardiomegaly: Secondary | ICD-10-CM

## 2023-08-16 DIAGNOSIS — I10 Essential (primary) hypertension: Secondary | ICD-10-CM

## 2023-08-16 DIAGNOSIS — I429 Cardiomyopathy, unspecified: Secondary | ICD-10-CM

## 2023-08-16 NOTE — Patient Instructions (Addendum)
  Medication Instructions:  No changes. *If you need a refill on your cardiac medications before your next appointment, please call your pharmacy*  Testing/Procedures: Your physician has requested that you have an echocardiogram. Due in September.Echocardiography is a painless test that uses sound waves to create images of your heart. It provides your doctor with information about the size and shape of your heart and how well your heart's chambers and valves are working. This procedure takes approximately one hour. There are no restrictions for this procedure. Please do NOT wear cologne, perfume, aftershave, or lotions (deodorant is allowed). Please arrive 15 minutes prior to your appointment time.  Please note: We ask at that you not bring children with you during ultrasound (echo/ vascular) testing. Due to room size and safety concerns, children are not allowed in the ultrasound rooms during exams. Our front office staff cannot provide observation of children in our lobby area while testing is being conducted. An adult accompanying a patient to their appointment will only be allowed in the ultrasound room at the discretion of the ultrasound technician under special circumstances. We apologize for any inconvenience.  You have been ordered a PYP Scan.  This is done at [facility], they will call you to schedule.This will be due in May 2025. When you come for this test please plan to be there 2-3 hours.  They are located at: [address] [place], [place] [address]     Follow-Up: At [facility], you and your health needs are our priority.  As part of our continuing mission to provide you with exceptional heart care, we have created designated Provider Care Teams.  These Care Teams include your primary Cardiologist (physician) and Advanced Practice Providers (APPs -  Physician Assistants and Nurse Practitioners) who all work together to provide you with the care you need, when you need  it.  We recommend signing up for the patient portal called "MyChart".  Sign up information is provided on this After Visit Summary.  MyChart is used to connect with patients for Virtual Visits (Telemedicine).  Patients are able to view lab/test results, encounter notes, upcoming appointments, etc.  Non-urgent messages can be sent to your provider as well.   To learn more about what you can do with MyChart, go to [email or web address].    Your next appointment:   6 month(s)  Provider:   Marjie Skiff, PA-C        Other Instructions

## 2023-10-16 ENCOUNTER — Encounter (HOSPITAL_COMMUNITY)

## 2023-10-28 ENCOUNTER — Other Ambulatory Visit: Payer: Self-pay | Admitting: Cardiology

## 2023-11-07 ENCOUNTER — Telehealth: Payer: Self-pay | Admitting: Cardiology

## 2023-11-07 ENCOUNTER — Other Ambulatory Visit (HOSPITAL_COMMUNITY): Payer: Self-pay

## 2023-11-07 ENCOUNTER — Telehealth: Payer: Self-pay

## 2023-11-07 NOTE — Telephone Encounter (Signed)
 Attempted to contact patient, no answer and unable to leave message. PA has been approved for entresto 

## 2023-11-07 NOTE — Telephone Encounter (Signed)
 Reason for walk-in: Walk-in Reasons: medication refill  If patient is requesting to be seen today, or if patient is having symptoms:  What symptoms are being reported (if any)?  N/A  Route to triage pool and ensure Teams message has been sent to the Triage Walk-In chat.  3.   For medication samples, medication refills, HIM requests, appointment requests, lab-related requests, or form/record drop-off, please route to the appropriate pool.  **Patient needs Entresto  refilled - [facility] is not cooperating and MyChart says two refills left on Entresto .  Please help this patient get a refill for her Entresto  medication.

## 2023-11-07 NOTE — Telephone Encounter (Signed)
 Pharmacy Patient Advocate Encounter  Received notification from [facility] that Prior Authorization for ENTRESTO  has been APPROVED from 11/07/23 to 11/06/24. Unable to obtain price due to refill too soon rejection, last fill date 11/07/23 next available fill date 01/14/24

## 2023-11-07 NOTE — Telephone Encounter (Signed)
 Pharmacy Patient Advocate Encounter   Received notification from Physician's Office that prior authorization for ENTRESTO  is required/requested.   Insurance verification completed.   The patient is insured through [facility] .   Per test claim: PA required; PA submitted to above mentioned insurance via CoverMyMeds Key/confirmation #/EOC [id number] Status is pending

## 2023-11-07 NOTE — Telephone Encounter (Signed)
 PA request has been Approved. New Encounter has been or will be created for follow up. For additional info see Pharmacy Prior Auth telephone encounter from 11/07/23.

## 2023-11-07 NOTE — Telephone Encounter (Signed)
 Per pharmacy needs a PA . Pharmacy is faxing PA request ./cy

## 2023-11-07 NOTE — Telephone Encounter (Signed)
 Spoke with patient, made aware that refills are available thru [facility] but waiting on prior authorization to get her insurance to pay for it. Patient thankful for the returned call and explanation, no further needs at this time

## 2023-12-11 ENCOUNTER — Other Ambulatory Visit: Payer: Self-pay | Admitting: Adult Health

## 2023-12-11 ENCOUNTER — Other Ambulatory Visit (HOSPITAL_COMMUNITY): Payer: Self-pay | Admitting: Family Medicine

## 2023-12-11 ENCOUNTER — Ambulatory Visit: Admitting: Adult Health

## 2023-12-11 ENCOUNTER — Encounter: Payer: Self-pay | Admitting: Adult Health

## 2023-12-11 VITALS — BP 131/68 | HR 40 | Ht 66.0 in | Wt 160.5 lb

## 2023-12-11 DIAGNOSIS — Z01419 Encounter for gynecological examination (general) (routine) without abnormal findings: Secondary | ICD-10-CM | POA: Diagnosis not present

## 2023-12-11 DIAGNOSIS — Z1231 Encounter for screening mammogram for malignant neoplasm of breast: Secondary | ICD-10-CM

## 2023-12-11 DIAGNOSIS — Z1331 Encounter for screening for depression: Secondary | ICD-10-CM

## 2023-12-11 DIAGNOSIS — N952 Postmenopausal atrophic vaginitis: Secondary | ICD-10-CM

## 2023-12-11 DIAGNOSIS — Z780 Asymptomatic menopausal state: Secondary | ICD-10-CM

## 2023-12-11 NOTE — Progress Notes (Signed)
Get DEXA

## 2023-12-11 NOTE — Progress Notes (Addendum)
 Patient ID: Penny Hicks, female   DOB: Dec 31, 1950, 73 y.o.   MRN: [id number] History of Present Illness: Penny Hicks is a 73 year old white female, married, PM in for a well woman gyn exam. She used to see [name], in [place] years ago, had ?vaginal sore then. Her grand daughter is with her.   PCP is [name]   Current Medications, Allergies, Past Medical History, Past Surgical History, Family History and Social History were reviewed in [employer] electronic medical record.     Review of Systems: Patient denies any headaches, hearing loss, fatigue, blurred vision, shortness of breath, chest pain, abdominal pain, problems with bowel movements, urination, or intercourse(not active). No joint pain or mood swings.  Denies any pain or vaginal bleeding   Physical Exam:BP 131/68 (BP Location: Right Arm, Patient Position: Sitting, Cuff Size: Normal)   Pulse (!) 40   Ht 5' 6 (1.676 m)   Wt 160 lb 8 oz (72.8 kg)   BMI 25.91 kg/m   General:  Well developed, well nourished, no acute distress Skin:  Warm and dry Neck:  Midline trachea, normal thyroid , good ROM, no lymphadenopathy, no carotid bruits heard Lungs; Clear to auscultation bilaterally Breast:  No dominant palpable mass, retraction, or nipple discharge Cardiovascular: Regular rate and rhythm Abdomen:  Soft, non tender, no hepatosplenomegaly Pelvic:  External genitalia is normal in appearance, no lesions.  The vagina is atrophic, has tenderness at about 1 0'clock in vagina, irritated, used slim Pederson speculum.  Urethra has no lesions or masses. The cervix is atrophic.SABRA  Uterus is felt to be normal size, shape, and contour.  No adnexal masses or tenderness noted.Bladder is non tender, no masses felt.  Rectal: Deferred  Extremities/musculoskeletal:  No swelling or varicosities noted, no clubbing, feet purple when hanging down then color returns when elevated, to see foot doctor  Psych:  No mood changes, alert and  cooperative,seems happy AA is 0 Fall risk is low    12/11/2023   10:09 AM  Depression screen PHQ 2/9  Decreased Interest 0  Down, Depressed, Hopeless 0  PHQ - 2 Score 0  Altered sleeping 0  Tired, decreased energy 1  Change in appetite 1  Feeling bad or failure about yourself  0  Trouble concentrating 0  Moving slowly or fidgety/restless 0  Suicidal thoughts 0  PHQ-9 Score 2       12/11/2023   10:09 AM  GAD 7 : Generalized Anxiety Score  Nervous, Anxious, on Edge 1  Control/stop worrying 0  Worry too much - different things 0  Trouble relaxing 0  Restless 1  Easily annoyed or irritable 0  Afraid - awful might happen 0  Total GAD 7 Score 2      Upstream - 12/11/23 1008       Pregnancy Intention Screening   Does the patient want to become pregnant in the next year? N/A    Does the patient's partner want to become pregnant in the next year? N/A    Would the patient like to discuss contraceptive options today? N/A      Contraception Wrap Up   Current Method Abstinence   PM   End Method Abstinence   PM   Contraception Counseling Provided No          Examination chaperoned by [name] LPN  Impression and plan: 1. Encounter for well woman exam with routine gynecological exam (Primary) Physical with PCP, labs with PCP Get mammogram Follow up in 5  weeks for ROS and review records   2. Vaginal atrophy +atrophy will some irritation  Will request records from [name]

## 2023-12-31 ENCOUNTER — Other Ambulatory Visit (HOSPITAL_COMMUNITY): Payer: Self-pay

## 2023-12-31 DIAGNOSIS — D329 Benign neoplasm of meninges, unspecified: Secondary | ICD-10-CM

## 2024-01-01 ENCOUNTER — Encounter: Payer: Self-pay | Admitting: Gastroenterology

## 2024-01-09 ENCOUNTER — Ambulatory Visit (HOSPITAL_COMMUNITY)
Admission: RE | Admit: 2024-01-09 | Discharge: 2024-01-09 | Disposition: A | Discharge status: Home / Self Care | Source: Ambulatory Visit | Attending: Family Medicine | Admitting: Family Medicine

## 2024-01-09 ENCOUNTER — Ambulatory Visit (HOSPITAL_COMMUNITY)
Admission: RE | Admit: 2024-01-09 | Discharge: 2024-01-09 | Disposition: A | Discharge status: Home / Self Care | Source: Ambulatory Visit | Attending: Adult Health | Admitting: Adult Health

## 2024-01-09 DIAGNOSIS — Z780 Asymptomatic menopausal state: Secondary | ICD-10-CM | POA: Insufficient documentation

## 2024-01-09 DIAGNOSIS — Z1231 Encounter for screening mammogram for malignant neoplasm of breast: Secondary | ICD-10-CM | POA: Insufficient documentation

## 2024-01-11 ENCOUNTER — Ambulatory Visit: Payer: Self-pay | Admitting: Adult Health

## 2024-01-15 ENCOUNTER — Ambulatory Visit: Admitting: Adult Health

## 2024-01-15 ENCOUNTER — Encounter: Payer: Self-pay | Admitting: Adult Health

## 2024-01-15 VITALS — BP 145/85 | HR 38 | Ht 65.0 in | Wt 163.0 lb

## 2024-01-15 DIAGNOSIS — N952 Postmenopausal atrophic vaginitis: Secondary | ICD-10-CM | POA: Diagnosis not present

## 2024-01-15 DIAGNOSIS — I10 Essential (primary) hypertension: Secondary | ICD-10-CM

## 2024-01-15 DIAGNOSIS — M810 Age-related osteoporosis without current pathological fracture: Secondary | ICD-10-CM

## 2024-01-15 MED ORDER — IBANDRONATE SODIUM 150 MG PO TABS
150.0000 mg | ORAL_TABLET | ORAL | 12 refills | Status: AC
Start: 2024-01-15 — End: ?

## 2024-01-15 NOTE — Progress Notes (Signed)
  Subjective:     Patient ID: Penny Hicks, female   DOB: 1951-06-12, 73 y.o.   MRN: [id number]  HPI Penny Hicks is a 73 year old white female,married,PM back in follow up on vaginal atrophy she says she is good. She had DEXA scan 01/09/24, showing osteoporosis.   PCP is [name]   Review of Systems Vagina is fine today. Reviewed past medical,surgical, social and family history. Reviewed medications and allergies.     Objective:   Physical Exam BP (!) 145/85 (BP Location: Right Arm, Patient Position: Sitting, Cuff Size: Normal)   Pulse (!) 38   Ht 5' 5 (1.651 m)   Wt 163 lb (73.9 kg)   BMI 27.12 kg/m     Skin warm and dry.  Lungs: clear to ausculation bilaterally. Cardiovascular: regular rate and rhythm.  Upstream - 01/15/24 1017       Pregnancy Intention Screening   Does the patient want to become pregnant in the next year? N/A    Does the patient's partner want to become pregnant in the next year? N/A    Would the patient like to discuss contraceptive options today? N/A      Contraception Wrap Up   Current Method Abstinence   PM   End Method Abstinence   PM   Contraception Counseling Provided No           Assessment:     1. Age-related osteoporosis without current pathological fracture (Primary) DEXA + osteoporosis  Discussed trying medication and she wants to try boniva , take with full glass of water and do not lay down for 30 minutes after or take any meds Meds ordered this encounter  Medications   ibandronate  (BONIVA ) 150 MG tablet    Sig: Take 1 tablet (150 mg total) by mouth every 30 (thirty) days. Take in the morning with a full glass of water, on an empty stomach, and do not take anything else by mouth or lie down for the next 30 min.    Dispense:  1 tablet    Refill:  12    Supervising Provider:   [name] [[id number]]     2. Vaginal atrophy Better, no burning or pain  Review records from [facility] in [place], no mention of any vaginal  lesions in 2021  3. Essential hypertension Continue BP meds and follow up with PCP     Plan:     Follow up in 11 months or sooner if needed

## 2024-01-30 ENCOUNTER — Ambulatory Visit: Admitting: Gastroenterology

## 2024-01-31 ENCOUNTER — Other Ambulatory Visit: Payer: Self-pay | Admitting: Nurse Practitioner

## 2024-02-22 ENCOUNTER — Encounter: Payer: Self-pay | Admitting: Student

## 2024-03-03 ENCOUNTER — Encounter (HOSPITAL_COMMUNITY): Payer: Self-pay

## 2024-03-10 ENCOUNTER — Ambulatory Visit (HOSPITAL_COMMUNITY)
Admission: RE | Admit: 2024-03-10 | Discharge: 2024-03-10 | Disposition: A | Discharge status: Home / Self Care | Source: Ambulatory Visit | Attending: Cardiology | Admitting: Cardiology

## 2024-03-10 DIAGNOSIS — I429 Cardiomyopathy, unspecified: Secondary | ICD-10-CM | POA: Diagnosis present

## 2024-03-10 LAB — ECHOCARDIOGRAM COMPLETE
Area-P 1/2: 2.65 cm2
S' Lateral: 4.2 cm

## 2024-03-11 ENCOUNTER — Ambulatory Visit: Payer: Self-pay | Admitting: Cardiology

## 2024-03-14 ENCOUNTER — Other Ambulatory Visit: Payer: Self-pay | Admitting: Cardiology

## 2024-03-14 DIAGNOSIS — I429 Cardiomyopathy, unspecified: Secondary | ICD-10-CM

## 2024-03-17 ENCOUNTER — Ambulatory Visit: Admitting: Gastroenterology

## 2024-03-18 ENCOUNTER — Ambulatory Visit (HOSPITAL_COMMUNITY): Admission: RE | Admit: 2024-03-18 | Source: Ambulatory Visit | Attending: Cardiology | Admitting: Cardiology

## 2024-03-20 ENCOUNTER — Telehealth (HOSPITAL_COMMUNITY): Payer: Self-pay | Admitting: Cardiology

## 2024-03-20 NOTE — Telephone Encounter (Signed)
 Patient cancelled PYP Scan and I called to reschedule per her VM. Patient states she is going out of town and will call us  back when she is ready to reschedule. Order will be removed from the echo WQ. Thank you.

## 2024-04-01 ENCOUNTER — Other Ambulatory Visit: Payer: Self-pay

## 2024-04-01 ENCOUNTER — Encounter (HOSPITAL_COMMUNITY): Payer: Self-pay | Admitting: Emergency Medicine

## 2024-04-01 ENCOUNTER — Emergency Department (HOSPITAL_COMMUNITY)
Admission: EM | Admit: 2024-04-01 | Discharge: 2024-04-02 | Disposition: A | Discharge status: Home / Self Care | Attending: Emergency Medicine | Admitting: Emergency Medicine

## 2024-04-01 DIAGNOSIS — S81811A Laceration without foreign body, right lower leg, initial encounter: Secondary | ICD-10-CM | POA: Insufficient documentation

## 2024-04-01 DIAGNOSIS — Y92008 Other place in unspecified non-institutional (private) residence as the place of occurrence of the external cause: Secondary | ICD-10-CM | POA: Insufficient documentation

## 2024-04-01 DIAGNOSIS — S8991XA Unspecified injury of right lower leg, initial encounter: Secondary | ICD-10-CM | POA: Diagnosis present

## 2024-04-01 DIAGNOSIS — Y9301 Activity, walking, marching and hiking: Secondary | ICD-10-CM | POA: Diagnosis not present

## 2024-04-01 DIAGNOSIS — W268XXA Contact with other sharp object(s), not elsewhere classified, initial encounter: Secondary | ICD-10-CM | POA: Diagnosis not present

## 2024-04-01 DIAGNOSIS — Z79899 Other long term (current) drug therapy: Secondary | ICD-10-CM | POA: Diagnosis not present

## 2024-04-01 MED ORDER — LIDOCAINE-EPINEPHRINE 1 %-1:100000 IJ SOLN
20.0000 mL | Freq: Once | INTRAMUSCULAR | Status: AC
  Administered 2024-04-02: 20 mL
  Filled 2024-04-01: qty 1

## 2024-04-01 MED ORDER — LORAZEPAM 1 MG PO TABS
0.5000 mg | ORAL_TABLET | Freq: Once | ORAL | Status: AC
  Administered 2024-04-01: 0.5 mg via ORAL
  Filled 2024-04-01: qty 1

## 2024-04-01 NOTE — ED Provider Notes (Signed)
  EMERGENCY DEPARTMENT AT [facility] Provider Note   CSN: [id number] Arrival date & time: 04/01/24  2308     Patient presents with: Penny Hicks is a 73 y.o. female.  {Add pertinent medical, surgical, social history, OB history to YEP:67052}  Fall       Prior to Admission medications   Medication Sig Start Date End Date Taking? Authorizing Provider  albuterol  (VENTOLIN  HFA) 108 (90 Base) MCG/ACT inhaler Inhale 2 puffs into the lungs every 4 (four) hours as needed for wheezing or shortness of breath. 02/12/20   Pearlean Manus, MD  atorvastatin  (LIPITOR) 40 MG tablet Take 1 tablet (40 mg total) by mouth daily. 05/15/23   Daneen Damien BROCKS, NP  Black Cohosh-SoyIsoflav-Magnol (ESTROVEN MENOPAUSE RELIEF) CAPS     [provider]  budesonide -formoterol  (SYMBICORT ) 160-4.5 MCG/ACT inhaler SMARTSIG:2 Puff(s) By Mouth Twice Daily 12/14/23   [provider]  CALCIUM  PO Take by mouth.    [provider]  cholecalciferol  (VITAMIN D ) 1000 UNITS tablet Take 1,000 Units by mouth daily.    [provider]  clotrimazole-betamethasone (LOTRISONE) cream Apply 1 Application topically 2 (two) times daily. 09/18/22   [provider]  ENTRESTO  97-103 MG TAKE 1 TABLET BY MOUTH TWICE A DAY 10/29/23   Lavona Agent, MD  ibandronate  (BONIVA ) 150 MG tablet Take 1 tablet (150 mg total) by mouth every 30 (thirty) days. Take in the morning with a full glass of water, on an empty stomach, and do not take anything else by mouth or lie down for the next 30 min. 01/15/24   Signa Delon LABOR, NP  metoprolol  succinate (TOPROL  XL) 25 MG 24 hr tablet Take 1 tablet (25 mg total) by mouth at bedtime. 01/10/23   Lavona Agent, MD  Nutritional Supplements (ESTROVEN PO) Take 1 tablet by mouth every evening.    [provider]  spironolactone  (ALDACTONE ) 25 MG tablet Take 1 tablet (25 mg total) by mouth daily. 05/14/23 01/15/24  Daneen Damien BROCKS, NP   traMADol  (ULTRAM ) 50 MG tablet Take 1 tablet (50 mg total) by mouth 4 (four) times daily as needed for moderate pain or severe pain. 10/14/22   Meyran, Suzen Lacks, NP  vitamin E  400 UNIT capsule Take 400 Units by mouth daily.    [provider]  zolpidem  (AMBIEN ) 10 MG tablet Take 10 mg by mouth daily. 11/13/22   [provider]    Allergies: Patient has no known allergies.    Review of Systems  Updated Vital Signs There were no vitals taken for this visit.  Physical Exam  (all labs ordered are listed, but only abnormal results are displayed) Labs Reviewed - No data to display  EKG: None  Radiology: No results found.  {Document cardiac monitor, telemetry assessment procedure when appropriate:32947} Procedures   Medications Ordered in the ED - No data to display    {Click here for ABCD2, HEART and other calculators REFRESH Note before signing:1}                              Medical Decision Making  ***  {Document critical care time when appropriate  Document review of labs and clinical decision tools ie CHADS2VASC2, etc  Document your independent review of radiology images and any outside records  Document your discussion with family members, caretakers and with consultants  Document social determinants of health affecting pt's care  Document your decision  making why or why not admission, treatments were needed:32947:::1}   Final diagnoses:  None    ED Discharge Orders     None

## 2024-04-01 NOTE — ED Triage Notes (Addendum)
 Pt BIB [facility] from home due to a fall. Pt stated she was walking and hit right leg on wooden handle on recliner around 2200. Pt states she was able to walk after. Upon assessment, pt does have a lac on RLE.   237/202 (not taking BP meds per EMS)

## 2024-04-02 NOTE — Discharge Instructions (Addendum)
 You had sutures in the emergency department that will need to be removed in the next 10 to 12 days. Please follow-up with your family doctor for wound check in the next three days.

## 2024-04-02 NOTE — ED Notes (Signed)
Pt ambulated well without any issues

## 2024-04-02 NOTE — ED Provider Notes (Signed)
.  Laceration Repair  Date/Time: 04/02/2024 1:22 AM  Performed by: Beverley Leita LABOR, PA-C Authorized by: Beverley Leita LABOR, PA-C   Consent:    Consent obtained:  Verbal   Consent given by:  Patient   Risks, benefits, and alternatives were discussed: yes     Risks discussed:  Infection, pain, poor cosmetic result, poor wound healing, need for additional repair and retained foreign body   Alternatives discussed:  No treatment Universal protocol:    Patient identity confirmed:  Verbally with patient Laceration details:    Location:  Leg   Leg location:  R lower leg   Length (cm):  13   Depth (mm):  10 Pre-procedure details:    Preparation:  Patient was prepped and draped in usual sterile fashion Exploration:    Hemostasis achieved with:  Epinephrine    Wound exploration: wound explored through full range of motion and entire depth of wound visualized     Wound extent: fascia not violated, no foreign body and no signs of injury     Contaminated: no   Treatment:    Area cleansed with:  Saline   Amount of cleaning:  Extensive   Irrigation solution:  Sterile saline   Debridement:  None   Undermining:  None Skin repair:    Repair method:  Sutures and Steri-Strips   Suture size:  4-0   Suture material:  Nylon   Suture technique:  Simple interrupted   Number of sutures:  12   Number of Steri-Strips:  11 Approximation:    Approximation:  Loose Repair type:    Repair type:  Intermediate Post-procedure details:    Dressing:  Bulky dressing and non-adherent dressing   Procedure completion:  Tolerated well, no immediate complications     Beverley Leita LABOR DEVONNA 04/02/24 0123    Griselda Norris, MD 04/02/24 2242

## 2024-04-10 ENCOUNTER — Other Ambulatory Visit: Payer: Self-pay | Admitting: Cardiology

## 2024-04-10 NOTE — H&P (View-Only) (Signed)
 Name:  Penny Hicks DOB: [birth year] Date: 04/10/2024  Subjective:  Penny Hicks is a 73 y.o. female here for wound infection.   Chief Complaint  Patient presents with  . stichs infected    Right leg infected in the area of her stitches told to come here by pcp.     Patient suffered a laceration to her right lower leg on 04/01/2024 and wound was repaired/sutured at another facility. Is concerned that she has developed increased erythema, swelling and tenderness around wound area with associated cloudy yellow drainage from wound. Tetanus booster 3 years ago.     The following portions of the patient's history were reviewed and updated as appropriate: allergies, current medications, past family history, past medical history, past social history, past surgical history, and problem list.   Review of Systems:    Review of systems negative unless otherwise stated in HPI or ROS below.  Review of Systems  Constitutional:  Negative for chills and fever.  All other systems reviewed and are negative.   Objective:   Physical Exam Vitals reviewed.  Constitutional:      General: She is not in acute distress.    Appearance: Normal appearance. She is not ill-appearing or toxic-appearing.  HENT:     Head: Normocephalic and atraumatic.  Cardiovascular:     Rate and Rhythm: Normal rate.  Pulmonary:     Effort: Pulmonary effort is normal.  Skin:    General: Skin is warm and dry.     Findings: Bruising and erythema present.         Comments: Large U shaped wound intact with black sutures in place. Moderate erythema and induration extending 2-3 cm from suture line. Cloudy, yellow discharge easily expressed from wound.   Neurological:     General: No focal deficit present.     Mental Status: She is alert and oriented to person, place, and time.  Psychiatric:        Mood and Affect: Mood normal.        Behavior: Behavior normal.     Vitals:   04/10/24 1716  BP: 155/97  Pulse:  111  Resp: 20  Temp: 36.7 C (98.1 F)  TempSrc: Oral  SpO2: 96%  Weight: 72.6 kg (160 lb)  Height: 165.1 cm (5' 5)     Assessment/Plan:   Diagnoses and all orders for this visit:  Localized bacterial skin infection -     cephalexin (KEFLEX) 500 MG capsule; Take 1 capsule (500 mg total) by mouth four (4) times a day for 7 days.  Other orders -     albuterol  HFA 90 mcg/actuation inhaler; Inhale 2 puffs. -     atorvastatin  (LIPITOR) 40 MG tablet; Take 1 tablet (40 mg total) by mouth daily. TAKE 1 TABLET (40 MG TOTAL) BY MOUTH DAILY. -     budesonide -formoterol  (SYMBICORT ) 160-4.5 mcg/actuation inhaler; SMARTSIG:2 Puff(s) By Mouth Twice Daily -     carvedilol  (COREG ) 3.125 MG tablet; TAKE 1 TABLET (3.125 MG TOTAL) BY MOUTH 2 (TWO) TIMES DAILY WITH A MEAL. -     cholecalciferol , vitamin D3 25 mcg, 1,000 units,, 1,000 unit (25 mcg) tablet; Take 1 tablet (25 mcg total) by mouth. -     clotrimazole-betamethasone (LOTRISONE) 1-0.05 % cream; APPLY TO AFFECTED AREA AND SURROUNDING AREA OF SKIN TWICE A DAY IN THE MORNING AND IN THE EVENING -     ibandronate  (BONIVA ) 150 mg tablet; Take 1 tablet (150 mg total) by mouth. -  levothyroxine  (SYNTHROID ) 75 MCG tablet; TAKE 1 TABLET BY MOUTH SKIP 1 DAY A WEEK ONCE A DAY FOR 5 DAYS AND SKIP 2 DAYS.; Duration: 90 -     metoPROLOL  succinate (TOPROL -XL) 25 MG 24 hr tablet; Take 1 tablet (25 mg total) by mouth. -     ENTRESTO  97-103 mg tablet; Take 1 tablet by mouth two (2) times a day. -     spironolactone  (ALDACTONE ) 25 MG tablet; Take 1 tablet (25 mg total) by mouth daily. TAKE 1 TABLET (25 MG TOTAL) BY MOUTH DAILY. -     traMADol  (ULTRAM ) 50 mg tablet; Take 1 tablet (50 mg total) by mouth. -     zolpidem  (AMBIEN ) 10 mg tablet; Take 1 tablet (10 mg total) by mouth daily. TAKE 10 MG BY MOUTH DAILY.    Plan/Patient Instructions: As stated in AVS.  Disposition Upon Discharge: Stable for at home care.   1.  Routine symptom specific, illness  specific and/or disease specific instructions were discussed with the patient and/or caregiver at length. See diagnosis specific instructions below.   2.  The differential diagnosis for the patient's specific symptoms were reviewed and discussed with the patient and/or caregiver at length. The patient/caregiver expressed understanding of all discussed and their relevant questions were all satisfactorily answered.   3.  Return to care advised should the presenting symptoms recur, persist, or worsen in any way; or in the alternative, if new symptoms or complaints develop.   4.  The patient and any family present were given verbal and/or written discharge instructions as clinically indicated and appropriate.  5. Primary care provider or medical subspecialty follow up discussed.   Delon LITTIE Windle Willy, PA, PA-C

## 2024-04-15 ENCOUNTER — Other Ambulatory Visit: Payer: Self-pay

## 2024-04-15 ENCOUNTER — Encounter (HOSPITAL_COMMUNITY): Payer: Self-pay | Admitting: *Deleted

## 2024-04-15 NOTE — Progress Notes (Signed)
 Assessment and Plan:   Assessment & Plan Wound infection  Infected laceration with possible hematoma, no improvement on Keflex, wound red and swollen with granulation tissue. - Sent wound culture to lab. - Prescribed doxycycline BID for 7 days. - Advised to eat with doxycycline. - Placed wound consult for follow-up. - Advised on infection signs and when to seek care. Orders: .  Aerobic/Anaerobic Culture .  doxycycline (VIBRAMYCIN) 100 MG capsule; Take 1 capsule (100 mg total) by mouth every twelve (12) hours for 7 days. .  Ambulatory referral to Wound Clinic; Future      The following portions of the patient's history were reviewed and updated as appropriate: allergies, current medications, past family history, past medical history, past social history, past surgical history and problem list.   Subjective:   Patient ID: Penny Hicks is a 73 y.o. female Chief Complaint  Patient presents with  . Abrasion    12 stitches in right leg on last Thursday.  It looks like it is draining and swollen     History of Present Illness Penny Hicks is a 73 year old female who presents with a concern for ongoing infection of a shin laceration.Initially presented to the emergency room on April 01, 2024, for a shin laceration, which was sutured. Returned to urgent care on April 10, 2024, due to concerns of infection and was prescribed Keflex. She is still taking Keflex without improvement.The wound has developed erythema with swelling and pain in the leg. There is no numbness or tingling. The leg has been swollen since the initial injury. She has no fevers or chills.She cleans the wound with soap and takes yogurt to manage stomach upset from antibiotics. Her foot gets red, a recurring issue even before the injury.    Abrasion  The incident occurred more than 1 week ago. The laceration is located on the Right leg. Size: please seek ED note from 04/01/24 for wound image. The pain is  moderate.    ROS: Negative unless otherwise noted in HPI.  Objective:   Vital Signs:  BP 113/74 (BP Site: L Arm, BP Position: Sitting, BP Cuff Size: X-Large)   Pulse 86   Temp 36.8 C (98.2 F) (Oral)   Resp 18   Ht 165.1 cm (5' 5)   Wt 72.6 kg (160 lb)   SpO2 96%   BMI 26.63 kg/m   Body mass index is 26.63 kg/m. No LMP recorded. Patient is postmenopausal.  No results found for this visit on 04/15/24.   Physical Exam Vitals and nursing note reviewed.  Constitutional:      General: She is not in acute distress.    Appearance: Normal appearance. She is normal weight. She is not ill-appearing or toxic-appearing.  HENT:     Head: Normocephalic.     Right Ear: Tympanic membrane, ear canal and external ear normal.     Left Ear: Tympanic membrane, ear canal and external ear normal.     Nose: Nose normal.     Mouth/Throat:     Mouth: Mucous membranes are moist.     Pharynx: Oropharynx is clear.  Eyes:     Extraocular Movements: Extraocular movements intact.     Conjunctiva/sclera: Conjunctivae normal.     Pupils: Pupils are equal, round, and reactive to light.  Cardiovascular:     Rate and Rhythm: Normal rate and regular rhythm.     Pulses:          Dorsalis pedis pulses are 2+ on the right  side and 2+ on the left side.       Posterior tibial pulses are 2+ on the right side and 2+ on the left side.     Heart sounds: Normal heart sounds.  Pulmonary:     Effort: Pulmonary effort is normal. No respiratory distress.     Breath sounds: Normal breath sounds.  Abdominal:     General: Abdomen is flat.     Palpations: Abdomen is soft.  Musculoskeletal:        General: Normal range of motion.     Cervical back: Normal range of motion.  Skin:    General: Skin is warm and dry.     Findings: Laceration present. No abscess or rash.         Comments: Laceration surrounded by erythema and swelling.  Area is tender to palpitation.  There is no firm area palpitated from laceration.   There is some serosanguineous drainage but no purulent drainage noted at this time.  Patient reports she has seen some purulent drainage from the wound.  Neurological:     General: No focal deficit present.     Mental Status: She is alert and oriented to person, place, and time. Mental status is at baseline.  Psychiatric:        Mood and Affect: Mood normal.        Behavior: Behavior normal.        Thought Content: Thought content normal.        Judgment: Judgment normal.      Follow-up as Needed  and Follow-up with PCP  The use of abridge was used to help in the completion of this note. Patient was educated and verbalized consent of its use.   Disposition Upon Discharge:  1.  Routine symptom specific, illness specific and/or disease specific instructions were discussed with the patient and/or caregiver at length.  2.  The differential diagnosis for the patient's specific symptoms were reviewed and discussed with the patient/caregiver at length; the patient/caregiver expressed understanding of all discussed and their relevant questions were all satisfactorily answered.  3.  Return to care should the presenting symptoms recur, persist, or worsen in any way; or in the alternative, if new symptoms or complaints develop.  4.  The patient and any family present were given verbal and/or written discharge instructions as clinically indicated and appropriate.  5.  Continue OTC medications as needed and tolerated for control of the currently reported symptoms, if no conflict with the currently prescribed medications.

## 2024-04-15 NOTE — Pre-Procedure Instructions (Signed)
-------------    SDW INSTRUCTIONS given:  Your procedure is scheduled on 11/6.  Report to [facility] Main Entrance A at 07:30 A.M., and check in at the Admitting office.  Any questions or running late day of surgery: call [phone]    Remember:  Do not eat or drink after midnight the night before your surgery     Take these medicines the morning of surgery with A SIP OF WATER  atorvastatin  (LIPITOR)  budesonide -formoterol  (SYMBICORT )  carvedilol  (COREG )  cephALEXin (KEFLEX)     May take these medicines IF NEEDED: albuterol  (VENTOLIN  HFA)- bring inhaler traMADol  (ULTRAM )    As of today, STOP taking any Aspirin  (unless otherwise instructed by your surgeon) Aleve, Naproxen, Ibuprofen, Motrin, Advil, Goody's, BC's, all herbal medications, fish oil, and all vitamins.   Do NOT Smoke (Tobacco/Vaping) 24 hours prior to your procedure  If you use a CPAP at night, you may bring all equipment for your overnight stay.     You will be asked to remove any contacts, glasses, piercing's, hearing aid's, dentures/partials prior to surgery. Please bring cases for these items if needed.     Patients discharged the day of surgery will not be allowed to drive home, and someone needs to stay with them for 24 hours.  SURGICAL WAITING ROOM VISITATION Patients may have no more than 2 support people in the waiting area - these visitors may rotate.   Pre-op nurse will coordinate an appropriate time for 1 ADULT support person, who may not rotate, to accompany patient in pre-op.  Children under the age of 1 must have an adult with them who is not the patient and must remain in the main waiting area with an adult.  If the patient needs to stay at the hospital during part of their recovery, the visitor guidelines for inpatient rooms apply.  Please refer to the [facility] website for the visitor guidelines for any additional information.   Special instructions:   [place]- Preparing For  Surgery   Please follow these instructions carefully.   Shower the NIGHT BEFORE SURGERY and the MORNING OF SURGERY with DIAL Soap.   Pat yourself dry with a CLEAN TOWEL.  Wear CLEAN PAJAMAS to bed the night before surgery  Place CLEAN SHEETS on your bed the night of your first shower and DO NOT SLEEP WITH PETS.   Additional instructions for the day of surgery: DO NOT APPLY any lotions, deodorants, cologne, or perfumes.   Do not wear jewelry or makeup Do not wear nail polish, gel polish, artificial nails, or any other type of covering on natural nails (fingers and toes) Do not bring valuables to the hospital. [facility] is not responsible for valuables/personal belongings. Put on clean/comfortable clothes.  Please brush your teeth.  Ask your nurse before applying any prescription medications to the skin.

## 2024-04-15 NOTE — Progress Notes (Signed)
 PCP - Dr. Norleen General Cardiologist - Dr. Lynwood Schilling  PPM/ICD - denies   Chest x-ray - 02/11/20 EKG - 08/16/23 Stress Test - denies ECHO - 03/10/24 Cardiac Cath - denies  CPAP - denies  DM- denies  ASA/Blood Thinner Instructions: n/a   ERAS Protcol - no, NPO  COVID TEST- n/a  Anesthesia review: yes, cardiac hx  Patient verbally denies any shortness of breath, fever, cough and chest pain during phone call   Questions were answered. Patient verbalized understanding of instructions.

## 2024-04-15 NOTE — Progress Notes (Signed)
 Tobacco Use: Medium Risk (04/15/2024)   Patient History   . Smoking Tobacco Use: Former   . Smokeless Tobacco Use: Never   . Passive Exposure: Not on file

## 2024-04-16 ENCOUNTER — Encounter (HOSPITAL_COMMUNITY): Payer: Self-pay | Admitting: Vascular Surgery

## 2024-04-16 NOTE — Anesthesia Preprocedure Evaluation (Addendum)
 Anesthesia Evaluation    Airway       Comment: Previous grade I view with MAC 3, easy mask Dental   Pulmonary pneumonia (h/o Legionella), COPD, former smoker          Cardiovascular hypertension (carvedilol , metoprolol , spironolactone ), Pt. on home beta blockers and Pt. on medications + CAD (non-obstructive) and +CHF (EF 40-45%)  + dysrhythmias (1st degree AV block, PVCs) + Valvular Problems/Murmurs (mild) MR   HLD, interatrial septal aneurysm with PFO  TTE 03/10/2024: IMPRESSIONS    1. EF difficult to interpret in setting of frequent PVCs. . Left  ventricular ejection fraction, by estimation, is 40 to 45%. The left  ventricle has mildly decreased function. The left ventricle demonstrates  global hypokinesis. There is mild left  ventricular hypertrophy. Left ventricular diastolic parameters are  consistent with Grade I diastolic dysfunction (impaired relaxation).   2. Right ventricular systolic function is normal. The right ventricular  size is normal. There is normal pulmonary artery systolic pressure.   3. Left atrial size was moderately dilated.   4. Right atrial size was mildly dilated.   5. The mitral valve is normal in structure. Mild mitral valve  regurgitation. No evidence of mitral stenosis.   6. The aortic valve is tricuspid. There is mild calcification of the  aortic valve. Aortic valve regurgitation is not visualized. Aortic valve  sclerosis/calcification is present, without any evidence of aortic  stenosis.   7. The inferior vena cava is normal in size with greater than 50%  respiratory variability, suggesting right atrial pressure of 3 mmHg.     Neuro/Psych Seizures -,  Meningioma, s/p craniotomy    GI/Hepatic   Endo/Other  Hypothyroidism    Renal/GU      Musculoskeletal  (+) Arthritis ,  Osteoporosis    Abdominal   Peds  Hematology   Anesthesia Other Findings Right shin wound   Reproductive/Obstetrics                              Anesthesia Physical Anesthesia Plan  ASA: 3  Anesthesia Plan: General   Post-op Pain Management: Minimal or no pain anticipated   Induction: Intravenous  PONV Risk Score and Plan: 3 and Ondansetron , Dexamethasone  and Treatment may vary due to age or medical condition  Airway Management Planned: Oral ETT  Additional Equipment:   Intra-op Plan:   Post-operative Plan: Extubation in OR  Informed Consent:   Plan Discussed with:   Anesthesia Plan Comments: (PAT note written 04/16/2024 by Allison Zelenak, PA-C.  )         Anesthesia Quick Evaluation

## 2024-04-16 NOTE — Progress Notes (Signed)
 Anesthesia Chart Review: Penny Hicks  Case: [id number] Date/Time: 04/17/24 0945   Procedure: MRI WITH ANESTHESIA - MRI OF BRAIN WITH AND WITHOUT CONTRAST   Anesthesia type: General   Pre-op diagnosis: D32.9 BEGINING NEOPLASM OF MENINGE   [facility] RAD[facility]   Surgeons: Radiologist, Medication, MD       DISCUSSION: Patient is a 73 year old female scheduled for the above procedure. She is s/p left parietal craniotomy for resection of grade 1 meningioma on 10/10/2022. Dr. Dorn Glade ordered a follow-up brain MRI under anesthesia. She had primary care visit on 04/04/2024 (see [facility] Everywhere).   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  ED visit 04/01/2024 for right shin laceration after her leg got caught on a wooden handle of her recliner as she was walking past it.  She had a large soft tissue injury with no evidence of muscle or bone involvement. Wound was irrigated and closed with 12 simple interrupted nylon sutures and 11 steri-strips. Tetanus had been updated 3 years ago. On 04/10/2024 she was evaluated at a [facility] by Delon Brain, PA due to increased erythema, swelling, and tenderness around the wound with yellow drainage. She was prescribed Keflex x 7 days. Re-evaluation on 04/15/2024 by Von Gosling, FNP due to persistent concern for wound infection despite Keflex. Primarily serosanguinous drainage expressed and sent for culture. Doxycline prescribed in the interim.   Last cardiology follow-up with Dr. Lavona was on 08/16/2023. Diagnosed with dilated CM in 2016 when she was admitted for a hip fracture. She was treated with ACEi and b-blocker therapy.  CCTA 04/11/2023 showed mild non-obstructive CAD. She also had aneurysmal atrial septum bowing rightwards with PFO with left to right shunt. There was also moderate dilation of her main PA, 35 mm, suggesting increased pulmonary pressures. No change in therapy at that time. At March follow-up, he wrote, Cardiomyopathy:   She is tolerating the meds as listed and likely would not tolerate a higher dose of beta-blocker.  No med titration.  I will check a PYP scan.  I do not suspect an infiltrative cardiomyopathy however.  I suspect this is related to hypertension and she currently has class I symptoms.  I will repeat an echocardiogram in September. She has not had the PYP scan yet. 03/10/2024 TTE showed LVEF 40-45% (previously 35-40% 03/08/2023), global LV hypokinesis, frequent PVCs, mild LVH, grade 1 DD, normal RV systolic function, normal PASP, moderately dilated LA, mildly dilated RA, mild MR.   Anesthesia team to evaluate on the day of procedure.    VS: Ht 5' 4 (1.626 m)   Wt 72.6 kg   BMI 27.46 kg/m   BP Readings from Last 3 Encounters:  04/01/24 (!) 163/100  01/15/24 (!) 145/85  12/11/23 131/68   Pulse Readings from Last 3 Encounters:  04/01/24 91  01/15/24 (!) 38  12/11/23 (!) 40     PROVIDERS: Marvine Rush, MD is listed as PCP. She had 04/04/2024 visit with Aparna Satsangi, MD at [facility] to establish care after transferring from [place].     LABS: For day of procedure. Most recent lab results in [facility] include: Lab Results  Component Value Date   WBC 13.5 (H) 10/13/2022   HGB 14.9 10/13/2022  HCT 45.5 10/13/2022   PLT 286 10/13/2022   GLUCOSE 105 (H) 05/29/2023   CHOL 162 05/14/2023   TRIG 148 05/14/2023   HDL 47 05/14/2023   LDLCALC 89 05/14/2023   ALT 10 05/14/2023   AST 16 05/14/2023   NA 143 05/29/2023   K 4.7 05/29/2023   CL 102 05/29/2023   CREATININE 1.26 (H) 05/29/2023   BUN 22 05/29/2023   CO2 27 05/29/2023     IMAGES: CT Chest (over read CCTA)  04/11/2023: IMPRESSION: 1. Small hiatal hernia. 2. Partially imaged cystic lesion in the liver. - Ascending thoracic aorta ectatic, 3.5 cm. Main pulmonary artery ectatic, 3.6 cm.   CTA Head/Neck 10/05/2022: IMPRESSION: 1. No intracranial large vessel occlusion or significant stenosis. 2. 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 This mass abuts the superior sagittal sinus but does not definitively invade it. Please see same day noncontrast enhanced CT brain for additional findings.    EKG: 08/16/2023: Sinus rhythm with 1st degree A-V block with frequent Premature ventricular complexes Possible Left atrial enlargement Minimal voltage criteria for LVH, may be normal variant ( Cornell product ) When compared with ECG of 09-Oct-2022 18:44, Premature ventricular complexes are now Present Confirmed by Lavona Agent ([id number]) on 08/16/2023 10:27:58 AM   CV: Echo 03/10/2024: IMPRESSIONS   1. EF difficult to interpret in setting of frequent PVCs. . Left  ventricular ejection fraction, by estimation, is 40 to 45%. The left  ventricle has mildly decreased function. The left ventricle demonstrates  global hypokinesis. There is mild left  ventricular hypertrophy. Left ventricular diastolic parameters are  consistent with Grade I diastolic dysfunction (impaired relaxation).   2. Right ventricular systolic function is normal. The right ventricular  size is normal. There is normal pulmonary artery systolic pressure.   3. Left atrial size was moderately dilated.   4. Right atrial size was mildly dilated.   5. The mitral valve is normal in structure. Mild mitral valve  regurgitation. No evidence of mitral stenosis.   6. The aortic valve is tricuspid. There is mild calcification of the  aortic valve. Aortic valve regurgitation is not visualized. Aortic valve  sclerosis/calcification is present, without any evidence of aortic   stenosis.   7. The inferior vena cava is normal in size with greater than 50%  respiratory variability, suggesting right atrial pressure of 3 mmHg.  - Comparison LVEF 35-40% with global hypokinesis 03/08/2023; 30-35% with global hypokinesis and akinesis basal inferolateral wall 10/09/2022; 30-35% with global hypokinesis 02/10/2020; 40-45% with global hypokinesis 05/25/2015   CTA Coronary 04/11/2023: IMPRESSION: 1. Mild CAD in the mid RCA, 25-49% stenosis, CADRADS 2. 2. Total plaque volume 132 mm3 which is 35th percentile for age- and sex-matched controls (calcified plaque 11 mm3; non-calcified plaque 121 mm3). TPV is moderate. 3. Coronary calcium  score is 65, which places the patient in the 58th percentile for age and sex matched control. 4. Normal coronary origins with right dominance. 5. 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 6. 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 RECOMMENDATIONS: CAD-RADS 2. Mild non-obstructive CAD (25-49%). Consider non-atherosclerotic causes of chest pain. Consider preventive therapy and risk factor modification.   Past Medical History:  Diagnosis Date   Arthritis    Chronic systolic CHF (congestive heart failure) (HCC) 05/2015   COPD (chronic obstructive pulmonary disease) (HCC)    Coronary artery disease    mild  CAD in the mRCA 25-49% 10/302024 CCTA   Hypercholesterolemia    Hypertension    Hypothyroidism    Interatrial septal aneurysm with PFO    04/11/2023 CCTA: aneurysmal atrial septum bowing rightwards with PFO with left to right shunt   Meningioma, cerebral (HCC)    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   Multiple thyroid  nodules    Osteoporosis     Past Surgical History:  Procedure Laterality Date   APPLICATION OF CRANIAL NAVIGATION Left 10/10/2022   Procedure: APPLICATION OF CRANIAL NAVIGATION;  Surgeon: Carollee Lani BROCKS, DO;  Location: [facility] OR;  Service: Neurosurgery;  Laterality: Left;   CESAREAN SECTION  x 2   CRANIOTOMY Left 10/10/2022   Procedure: LEFT CRANIOTOMY TUMOR EXCISION;  Surgeon: Carollee Lani BROCKS, DO;  Location: [facility] OR;  Service: Neurosurgery;  Laterality: Left;   HIP PINNING,CANNULATED Right 05/25/2015   Procedure: CANNULATED HIP PINNING;  Surgeon: Norleen Gavel, MD;  Location: [facility] OR;  Service: Orthopedics;  Laterality: Right;   RADIOLOGY WITH ANESTHESIA N/A 10/08/2022   Procedure: MRI WITH ANESTHESIA;  Surgeon: Radiologist, Medication, MD;  Location: [facility] OR;  Service: Radiology;  Laterality: N/A;   TONSILLECTOMY      MEDICATIONS: No current facility-administered medications for this encounter.    albuterol  (VENTOLIN  HFA) 108 (90 Base) MCG/ACT inhaler   atorvastatin  (LIPITOR) 40 MG tablet   Black Cohosh-SoyIsoflav-Magnol (ESTROVEN MENOPAUSE RELIEF) CAPS   budesonide -formoterol  (SYMBICORT ) 160-4.5 MCG/ACT inhaler   carvedilol  (COREG ) 3.125 MG tablet   cephALEXin (KEFLEX) 500 MG capsule   clotrimazole-betamethasone (LOTRISONE) cream   diphenhydrAMINE HCl (BENADRYL ALLERGY PO)   ENTRESTO  97-103 MG   ibandronate  (BONIVA ) 150 MG tablet   metoprolol  succinate (TOPROL -XL) 25 MG 24 hr tablet   spironolactone  (ALDACTONE ) 25 MG tablet   traMADol  (ULTRAM ) 50 MG tablet   VITAMIN D  PO   vitamin E  400 UNIT capsule   zolpidem  (AMBIEN ) 10 MG tablet    Isaiah Ruder, PA-C Surgical Short Stay/Anesthesiology [facility] Phone [phone] [facility] Phone [phone] 04/16/2024 10:23 AM

## 2024-04-17 ENCOUNTER — Ambulatory Visit (HOSPITAL_COMMUNITY): Admission: RE | Admit: 2024-04-17 | Source: Ambulatory Visit

## 2024-04-17 ENCOUNTER — Encounter (HOSPITAL_COMMUNITY): Payer: Self-pay

## 2024-04-17 ENCOUNTER — Encounter (HOSPITAL_COMMUNITY): Payer: Self-pay | Admitting: Vascular Surgery

## 2024-04-17 ENCOUNTER — Ambulatory Visit (HOSPITAL_COMMUNITY): Admit: 2024-04-17

## 2024-04-17 HISTORY — DX: Benign neoplasm of cerebral meninges: D32.0

## 2024-04-17 HISTORY — DX: Nontoxic multinodular goiter: E04.2

## 2024-04-17 HISTORY — DX: Aneurysm of heart: I25.3

## 2024-04-17 HISTORY — DX: Atherosclerotic heart disease of native coronary artery without angina pectoris: I25.10

## 2024-04-17 SURGERY — MRI WITH ANESTHESIA
Anesthesia: General

## 2024-04-17 NOTE — Progress Notes (Signed)
 Per pt her procedure was cancelled due to insurance.

## 2024-04-18 ENCOUNTER — Ambulatory Visit (HOSPITAL_BASED_OUTPATIENT_CLINIC_OR_DEPARTMENT_OTHER)
Admission: RE | Admit: 2024-04-18 | Discharge: 2024-04-18 | Disposition: A | Discharge status: Home / Self Care | Source: Ambulatory Visit

## 2024-04-18 ENCOUNTER — Other Ambulatory Visit (HOSPITAL_BASED_OUTPATIENT_CLINIC_OR_DEPARTMENT_OTHER): Payer: Self-pay

## 2024-04-18 DIAGNOSIS — R2240 Localized swelling, mass and lump, unspecified lower limb: Secondary | ICD-10-CM

## 2024-04-20 ENCOUNTER — Other Ambulatory Visit: Payer: Self-pay | Admitting: Nurse Practitioner

## 2024-04-24 ENCOUNTER — Other Ambulatory Visit: Payer: Self-pay

## 2024-04-24 ENCOUNTER — Ambulatory Visit (HOSPITAL_COMMUNITY): Admitting: Physical Therapy

## 2024-04-24 DIAGNOSIS — M79661 Pain in right lower leg: Secondary | ICD-10-CM | POA: Diagnosis present

## 2024-04-24 DIAGNOSIS — R600 Localized edema: Secondary | ICD-10-CM | POA: Insufficient documentation

## 2024-04-24 DIAGNOSIS — S81801D Unspecified open wound, right lower leg, subsequent encounter: Secondary | ICD-10-CM | POA: Diagnosis present

## 2024-04-24 NOTE — Therapy (Signed)
 OUTPATIENT PHYSICAL THERAPY Wound EVALUATION   Patient Name: Penny Hicks MRN: [id number] DOB:29-May-1951, 73 y.o., female Today's Date: 04/24/2024   PCP: Norleen General  REFERRING PROVIDER: Rosette Russian  END OF SESSION:  PT End of Session - 04/24/24 1029     Visit Number 1    Number of Visits 12    Date for Recertification  06/11/24    Authorization Type UHC auth placed    Authorization - Visit Number 1    Authorization - Number of Visits 12    Progress Note Due on Visit 10    PT Start Time 0928    PT Stop Time 1016    PT Time Calculation (min) 48 min    Activity Tolerance Patient limited by pain    Behavior During Therapy [facility] for tasks assessed/performed          Past Medical History:  Diagnosis Date   Arthritis    Chronic systolic CHF (congestive heart failure) (HCC) 05/2015   COPD (chronic obstructive pulmonary disease) (HCC)    Coronary artery disease    mild CAD in the mRCA 25-49% 10/302024 CCTA   Hypercholesterolemia    Hypertension    Hypothyroidism    Interatrial septal aneurysm with PFO    04/11/2023 CCTA: aneurysmal atrial septum bowing rightwards with PFO with left to right shunt   Meningioma, cerebral (HCC)    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   Multiple thyroid  nodules    Osteoporosis    Past Surgical History:  Procedure Laterality Date   APPLICATION OF CRANIAL NAVIGATION Left 10/10/2022   Procedure: APPLICATION OF CRANIAL NAVIGATION;  Surgeon: Carollee Lani BROCKS, DO;  Location: [facility] OR;  Service: Neurosurgery;  Laterality: Left;   CESAREAN SECTION  x 2   CRANIOTOMY Left 10/10/2022   Procedure: LEFT CRANIOTOMY TUMOR EXCISION;  Surgeon: Carollee Lani BROCKS, DO;  Location: [facility] OR;  Service: Neurosurgery;  Laterality: Left;   HIP PINNING,CANNULATED Right 05/25/2015   Procedure: CANNULATED HIP PINNING;  Surgeon: Norleen Gavel, MD;  Location: [facility] OR;  Service: Orthopedics;  Laterality: Right;   RADIOLOGY WITH ANESTHESIA N/A 10/08/2022    Procedure: MRI WITH ANESTHESIA;  Surgeon: Radiologist, Medication, MD;  Location: [facility] OR;  Service: Radiology;  Laterality: N/A;   TONSILLECTOMY     Patient Active Problem List   Diagnosis Date Noted   Age-related osteoporosis without current pathological fracture 01/15/2024   Vaginal atrophy 12/11/2023   Encounter for well woman exam with routine gynecological exam 12/11/2023   Cardiomyopathy (HCC) 10/19/2022   LVH (left ventricular hypertrophy) 10/19/2022   Seizure (HCC) 10/05/2022   Legionella pneumonia (HCC) 02/17/2020   Sepsis due to pneumonia (HCC) 02/09/2020   Acute respiratory failure with hypoxia due to PNA/COPD 02/09/2020   Chronic HFrEF (heart failure with reduced ejection fraction) (HCC) 02/09/2020   CHF (congestive heart failure) (HCC)    Chronic systolic heart failure (HCC) 05/27/2015   Hip fracture, right (HCC) 05/24/2015   Hip fracture (HCC) 05/24/2015   TOBACCO ABUSE 07/05/2010   Essential hypertension 07/05/2010   GOITER, MULTINODULAR 06/21/2010   HYPERLIPIDEMIA 06/21/2010   Palpitations 06/21/2010   CHEST DISCOMFORT 06/21/2010    ONSET DATE: 04/01/24  REFERRING DIAG:  Diagnosis  S81.811D (ICD-10-CM) - Laceration without foreign body, right lower leg, subsequent encounter    THERAPY DIAG:  Pain in right lower leg  Localized edema  Open leg wound, right, subsequent encounter  Rationale for Evaluation and Treatment: Rehabilitation     Wound Therapy -  04/24/24 0001     Subjective PT states that she hit her leg on a the wooden lever of a recliner on 04/01/24.  She went to the ER and had stitches put in but they did not hold.  She is on her 4th antibiotic.    Patient and Family Stated Goals less pain and for the wound to heal    Date of Onset 04/01/24    Prior Treatments ER, urgent care and self care    Pain Scale 0-10    Pain Score 8     Pain Type Acute pain    Pain Location Leg    Pain Orientation Right;Lower    Pain Descriptors / Indicators Sharp     Pain Onset Unable to tell    Patients Stated Pain Goal 0    Pain Intervention(s) Emotional support    Evaluation and Treatment Procedures Explained to Patient/Family Yes    Evaluation and Treatment Procedures agreed to    Wound Properties Date First Assessed: 04/24/24 Time First Assessed: 0940 Present on Original Admission: Yes Primary Wound Type: Traumatic Secondary Wound Type - Traumatic: Laceration Location: Pretibial Location Orientation: Right   Wound Image Images linked: 1    Site / Wound Assessment Dusky    Peri-wound Assessment Edema;Erythema (blanchable)    Wound Length (cm) 4.5 cm    Wound Width (cm) 5 cm    Wound Surface Area (cm^2) 17.67 cm^2    Wound Depth (cm) 0.3 cm    Wound Volume (cm^3) 3.534 cm^3    Drainage Description Serosanguineous    Drainage Amount Small    Treatments Cleansed;Moisturizing cream;Site care;Other (Comment)   manual to decrease edema and debridement.   Dressing Type None    Dressing Changed New    Dressing Status None    Wound Therapy - Clinical Statement see below    Wound Therapy - Functional Problem List see below    Factors Delaying/Impairing Wound Healing Infection - systemic/local;Vascular compromise    Hydrotherapy Plan Debridement;Dressing change;Patient/family education;Pulsatile lavage with suction    Wound Therapy - Frequency 2X / week    Wound Therapy - Current Recommendations PT    Wound Plan debridement, manlual and dressing change    Dressing  honey alginate, 4x4 followed by profore lite.    Manual Therapy decongestive techniques with lotion to decrease edema.            PATIENT EDUCATION: Education details: Keep dressing on unless it becomes painful.  Keep dressing dry Person educated: Patient and Child(ren) Education method: Explanation Education comprehension: verbalized understanding   HOME EXERCISE PROGRAM: none   GOALS: Goals reviewed with patient? Yes  SHORT TERM GOALS: Target date: 05/15/24  Pt pain to  be no greater than a 4/10 Baseline: Goal status: INITIAL  2.  Pt wound to be 80% granulated Baseline:  Goal status: INITIAL   LONG TERM GOALS: Target date: 06/11/24- extended due to holidays where clinic will be closed.   Pt pain to be no greater than a 2/10 Baseline:  Goal status: INITIAL  2.  Pt wound to be 100% granulated to allow pt to be comfortable with self care.  Baseline:  Goal status: INITIAL  3.  Wound to be no greater than 2x2 cm to allow pt to be comfortable with self care.  Baseline:  Goal status: INITIAL   ASSESSMENT:  CLINICAL IMPRESSION: Patient is a 73 y.o. female who was seen today for physical therapy evaluation and treatment for evaluation and treatment of  a Rt LE laceration.  Pt has increased pain, increased edema, increased erythema and decreased granulation, (0%), in the wound bed.  The wound occurred 3 weeks ago.  Penny Hicks will benefit from skilled PT to create a healing environment for the wound by debridement and decreasing the edema in her leg which will decrease her pain and increase her activity level.     OBJECTIVE IMPAIRMENTS: difficulty walking, increased edema, pain, and decreased skin integrity.   ACTIVITY LIMITATIONS: bathing, dressing, hygiene/grooming, and locomotion level  PARTICIPATION LIMITATIONS: cleaning, shopping, and community activity  PERSONAL FACTORS: Time since onset of injury/illness/exacerbation and 1 comorbidity: infection  are also affecting patient's functional outcome.   REHAB POTENTIAL: Good  CLINICAL DECISION MAKING: Evolving/moderate complexity  EVALUATION COMPLEXITY: Moderate  PLAN: PT FREQUENCY: 2x/week  PT DURATION: 6 weeks  PLANNED INTERVENTIONS: 97535- Self Care, 02859- Manual therapy, 97597- Wound care (first 20 sq cm), 97598- Wound care (each additional 20 sq cm), and Patient/Family education  PLAN FOR NEXT SESSION: Continue to complete debridement/manual and compression  to ensure a healing  environment is maintained.   Montie Metro, PT CLT [phone]  04/24/2024, 10:39 AM  [employer] Medicare Auth Request Information Treatment Start Date: 04/24/24  Date of referral: 04/18/24 Referring provider: Satsangi, Aparna, MD Referring diagnosis (ICD 10)?  Diagnosis  S81.811D (ICD-10-CM) - Laceration without foreign body, right lower leg, subsequent encounter   Treatment diagnosis (ICD 10)? (if different than referring diagnosis)  Diagnosis  S81.811D (ICD-10-CM) - Laceration without foreign body, right lower leg, subsequent encounter  M79.661 R60.0  What was this (referring dx) caused by? Felton Hawks of Condition: Initial Onset (within last 3 months)   Laterality: Rt  Objective measurements identify impairments when they are compared to normal values, the uninvolved extremity, and prior level of function.  [x]  Yes  []  No  Objective assessment of functional ability: Moderate functional limitations   Briefly describe symptoms: Pt fell on lever to the recliner causing a laceration to her Rt leg  How did symptoms start: 04/01/24  Average pain intensity:  Last 24 hours: 8  Past week: 9  How often does the pt experience symptoms? Occasionally  How much have the symptoms interfered with usual daily activities? Quite a bit  How has condition changed since care began at this facility? NA - initial visit  In general, how is the patients overall health? Good

## 2024-04-25 ENCOUNTER — Other Ambulatory Visit: Payer: Self-pay

## 2024-04-25 NOTE — Progress Notes (Signed)
 SDW CALL  Patient was given pre-op instructions over the phone. The opportunity was given for the patient to ask questions. No further questions asked. Patient verbalized understanding of instructions given.   PCP - Marvine Rush, MD Cardiologist - Lavona Agent, MD  PPM/ICD - denies Device Orders -  Rep Notified -   Chest x-ray - na EKG - 08/16/23 Stress Test - denies ECHO - 03/10/24 Cardiac Cath - denies  Sleep Study - denies CPAP - no  Fasting Blood Sugar - na Checks Blood Sugar _____ times a day  Blood Thinner Instructions:na Aspirin  Instructions:na  ERAS Protcol -clears until 0900 PRE-SURGERY Ensure or G2- no  COVID TEST- na   Anesthesia review: reviewed by Allison Zelenak,PA-C 04/16/24. No changes in patient's medical condition since then.   Patient denies shortness of breath, fever, cough and chest pain over the phone call

## 2024-04-27 NOTE — Progress Notes (Signed)
------  [facility] PROCEDURAL EXPEDITER NOTE--------------  Patient Name: Penny Hicks Patient DOB: 1951/02/17 Today's Date: 04/27/24   Chart reviewed: Yes  Documentation gaps: N Orders in place: Yes  Communication with surgical team if no orders: N/A Labs, test, and orders reviewed: Y - Anesthesia Order Set Placed Requires surgical clearance:  No Barriers noted:N/A  Intervention provided by [facility] team: V/A Barrier resolved: N/A    Devere Penner, RN  [facility] Expeditor

## 2024-04-28 ENCOUNTER — Ambulatory Visit (HOSPITAL_COMMUNITY)
Admission: RE | Admit: 2024-04-28 | Discharge: 2024-04-28 | Disposition: A | Discharge status: Home / Self Care | Source: Ambulatory Visit

## 2024-04-28 ENCOUNTER — Ambulatory Visit (HOSPITAL_COMMUNITY): Admitting: Certified Registered Nurse Anesthetist

## 2024-04-28 ENCOUNTER — Encounter (HOSPITAL_COMMUNITY): Admission: RE | Disposition: A | Discharge status: Home / Self Care | Payer: Self-pay | Source: Ambulatory Visit

## 2024-04-28 ENCOUNTER — Ambulatory Visit (HOSPITAL_COMMUNITY)

## 2024-04-28 DIAGNOSIS — I110 Hypertensive heart disease with heart failure: Secondary | ICD-10-CM | POA: Diagnosis not present

## 2024-04-28 DIAGNOSIS — D329 Benign neoplasm of meninges, unspecified: Secondary | ICD-10-CM

## 2024-04-28 DIAGNOSIS — Z01818 Encounter for other preprocedural examination: Secondary | ICD-10-CM

## 2024-04-28 DIAGNOSIS — M810 Age-related osteoporosis without current pathological fracture: Secondary | ICD-10-CM | POA: Diagnosis not present

## 2024-04-28 DIAGNOSIS — Z87891 Personal history of nicotine dependence: Secondary | ICD-10-CM | POA: Diagnosis not present

## 2024-04-28 DIAGNOSIS — I2510 Atherosclerotic heart disease of native coronary artery without angina pectoris: Secondary | ICD-10-CM

## 2024-04-28 DIAGNOSIS — J449 Chronic obstructive pulmonary disease, unspecified: Secondary | ICD-10-CM | POA: Insufficient documentation

## 2024-04-28 DIAGNOSIS — M1990 Unspecified osteoarthritis, unspecified site: Secondary | ICD-10-CM | POA: Insufficient documentation

## 2024-04-28 DIAGNOSIS — E039 Hypothyroidism, unspecified: Secondary | ICD-10-CM | POA: Insufficient documentation

## 2024-04-28 DIAGNOSIS — I509 Heart failure, unspecified: Secondary | ICD-10-CM | POA: Insufficient documentation

## 2024-04-28 DIAGNOSIS — I6782 Cerebral ischemia: Secondary | ICD-10-CM | POA: Insufficient documentation

## 2024-04-28 DIAGNOSIS — I5022 Chronic systolic (congestive) heart failure: Secondary | ICD-10-CM

## 2024-04-28 HISTORY — PX: RADIOLOGY WITH ANESTHESIA: SHX6223

## 2024-04-28 LAB — BASIC METABOLIC PANEL WITH GFR
Anion gap: 12 (ref 5–15)
BUN: 21 mg/dL (ref 8–23)
CO2: 22 mmol/L (ref 22–32)
Calcium: 8.7 mg/dL — ABNORMAL LOW (ref 8.9–10.3)
Chloride: 105 mmol/L (ref 98–111)
Creatinine, Ser: 1.31 mg/dL — ABNORMAL HIGH (ref 0.44–1.00)
GFR, Estimated: 43 mL/min — ABNORMAL LOW (ref >60)
Glucose, Bld: 102 mg/dL — ABNORMAL HIGH (ref 70–99)
Potassium: 4.3 mmol/L (ref 3.5–5.1)
Sodium: 139 mmol/L (ref 135–145)

## 2024-04-28 LAB — CBC
HCT: 41.4 % (ref 36.0–46.0)
Hemoglobin: 13.4 g/dL (ref 12.0–15.0)
MCH: 30.8 pg (ref 26.0–34.0)
MCHC: 32.4 g/dL (ref 30.0–36.0)
MCV: 95.2 fL (ref 80.0–100.0)
Platelets: 389 K/uL (ref 150–400)
RBC: 4.35 MIL/uL (ref 3.87–5.11)
RDW: 12.2 % (ref 11.5–15.5)
WBC: 8.6 K/uL (ref 4.0–10.5)
nRBC: 0 % (ref 0.0–0.2)

## 2024-04-28 SURGERY — MRI WITH ANESTHESIA
Anesthesia: General

## 2024-04-28 MED ORDER — SUGAMMADEX SODIUM 200 MG/2ML IV SOLN
INTRAVENOUS | Status: DC | PRN
  Administered 2024-04-28: 200 mg via INTRAVENOUS

## 2024-04-28 MED ORDER — LACTATED RINGERS IV SOLN: INTRAVENOUS | Status: DC

## 2024-04-28 MED ORDER — CHLORHEXIDINE GLUCONATE 0.12 % MT SOLN: 15.0000 mL | Freq: Once | OROMUCOSAL | Status: AC

## 2024-04-28 MED ORDER — DEXAMETHASONE SOD PHOSPHATE PF 10 MG/ML IJ SOLN
INTRAMUSCULAR | Status: DC | PRN
  Administered 2024-04-28: 4 mg via INTRAVENOUS

## 2024-04-28 MED ORDER — CHLORHEXIDINE GLUCONATE 0.12 % MT SOLN
OROMUCOSAL | Status: AC
  Administered 2024-04-28: 15 mL via OROMUCOSAL
  Filled 2024-04-28: qty 15

## 2024-04-28 MED ORDER — ORAL CARE MOUTH RINSE: 15.0000 mL | Freq: Once | OROMUCOSAL | Status: AC

## 2024-04-28 MED ORDER — LIDOCAINE 2% (20 MG/ML) 5 ML SYRINGE
INTRAMUSCULAR | Status: DC | PRN
  Administered 2024-04-28: 100 mg via INTRAVENOUS

## 2024-04-28 MED ORDER — ONDANSETRON HCL 4 MG/2ML IJ SOLN
INTRAMUSCULAR | Status: DC | PRN
  Administered 2024-04-28: 4 mg via INTRAVENOUS

## 2024-04-28 MED ORDER — ROCURONIUM BROMIDE 10 MG/ML (PF) SYRINGE
PREFILLED_SYRINGE | INTRAVENOUS | Status: DC | PRN
  Administered 2024-04-28: 40 mg via INTRAVENOUS

## 2024-04-28 MED ORDER — PROPOFOL 10 MG/ML IV BOLUS
INTRAVENOUS | Status: DC | PRN
Start: 2024-04-28 — End: 2024-04-28
  Administered 2024-04-28: 120 mg via INTRAVENOUS

## 2024-04-28 MED ORDER — GADOBUTROL 1 MMOL/ML IV SOLN
7.2000 mL | Freq: Once | INTRAVENOUS | Status: AC | PRN
  Administered 2024-04-28: 7.2 mL via INTRAVENOUS

## 2024-04-28 NOTE — Interval H&P Note (Signed)
 History and Physical Interval Note:  04/28/2024 3:48 PM  Penny Hicks  has presented today for surgery, with the diagnosis of BENIGN NEOPLASM OF MENINGIES.  The various methods of treatment have been discussed with the patient and family. After consideration of risks, benefits and other options for treatment, the patient has consented to  Procedure(s) with comments: MRI WITH ANESTHESIA (N/A) - MRI OF BRAIN WITH AND WITHOUT CONTRAST as a surgical intervention.  The patient's history has been reviewed, patient examined, no change in status, stable for surgery.  I have reviewed the patient's chart and labs.  Questions were answered to the patient's satisfaction.     Dorn JONELLE Glade

## 2024-04-28 NOTE — Transfer of Care (Signed)
 Immediate Anesthesia Transfer of Care Note  Patient: Lavetta Geier  Procedure(s) Performed: MRI WITH ANESTHESIA  Patient Location: PACU  Anesthesia Type:General  Level of Consciousness: awake, alert , and oriented  Airway & Oxygen Therapy: Patient Spontanous Breathing and Patient connected to nasal cannula oxygen  Post-op Assessment: Report given to RN and Post -op Vital signs reviewed and stable  Post vital signs: Reviewed and stable  Last Vitals:  Vitals Value Taken Time  BP 138/80 04/28/24 13:20  Temp 36.7 C 04/28/24 13:20  Pulse 88 04/28/24 13:27  Resp 15 04/28/24 13:27  SpO2 93 % 04/28/24 13:27  Vitals shown include unfiled device data.  Last Pain:  Vitals:   04/28/24 1320  TempSrc:   PainSc: 0-No pain         Complications: No notable events documented.

## 2024-04-28 NOTE — Anesthesia Postprocedure Evaluation (Signed)
 Anesthesia Post Note  Patient: Penny Hicks  Procedure(s) Performed: MRI WITH ANESTHESIA     Patient location during evaluation: PACU Anesthesia Type: General Level of consciousness: awake and alert Pain management: pain level controlled Vital Signs Assessment: post-procedure vital signs reviewed and stable Respiratory status: spontaneous breathing, nonlabored ventilation, respiratory function stable and patient connected to nasal cannula oxygen Cardiovascular status: blood pressure returned to baseline and stable Postop Assessment: no apparent nausea or vomiting Anesthetic complications: no   No notable events documented.  Last Vitals:  Vitals:   04/28/24 1330 04/28/24 1345  BP: (!) 155/75 (!) 148/77  Pulse: 89 90  Resp: 15 11  Temp:    SpO2: 92% 92%    Last Pain:  Vitals:   04/28/24 1345  TempSrc:   PainSc: 0-No pain                 Garnette DELENA Gab

## 2024-04-28 NOTE — Anesthesia Procedure Notes (Signed)
 Procedure Name: Intubation Date/Time: 04/28/2024 12:43 PM  Performed by: Lockie Flesher, CRNAPre-anesthesia Checklist: Patient identified, Emergency Drugs available, Suction available and Patient being monitored Patient Re-evaluated:Patient Re-evaluated prior to induction Oxygen Delivery Method: Circle System Utilized Preoxygenation: Pre-oxygenation with 100% oxygen Induction Type: IV induction Ventilation: Mask ventilation without difficulty Laryngoscope Size: Mac and 3 Grade View: Grade I Tube type: Oral Tube size: 7.0 mm Number of attempts: 1 Airway Equipment and Method: Stylet and Oral airway Placement Confirmation: ETT inserted through vocal cords under direct vision, positive ETCO2 and breath sounds checked- equal and bilateral Secured at: 23 cm Tube secured with: Tape Dental Injury: Teeth and Oropharynx as per pre-operative assessment

## 2024-04-28 NOTE — Anesthesia Preprocedure Evaluation (Addendum)
 Anesthesia Evaluation  Patient identified by MRN, date of birth, ID band Patient awake    Reviewed: Allergy & Precautions, NPO status , Patient's Chart, lab work & pertinent test results  History of Anesthesia Complications Negative for: history of anesthetic complications  Airway Mallampati: II  TM Distance: >3 FB Neck ROM: Full   Comment: Previous grade I view with MAC 3, easy mask Dental no notable dental hx. (+) Missing, Dental Advisory Given, Teeth Intact,    Pulmonary pneumonia, resolved, COPD, former smoker   Pulmonary exam normal breath sounds clear to auscultation       Cardiovascular hypertension, Pt. on home beta blockers and Pt. on medications (-) angina + CAD (non-obstructive) and +CHF (EF 40-45%)  (-) Past MI Normal cardiovascular exam+ dysrhythmias (1st degree AV block, PVCs) + Valvular Problems/Murmurs (mild) MR  Rhythm:Regular Rate:Normal  HLD, interatrial septal aneurysm with PFO  TTE 03/10/2024: IMPRESSIONS    1. EF difficult to interpret in setting of frequent PVCs. . Left  ventricular ejection fraction, by estimation, is 40 to 45%. The left  ventricle has mildly decreased function. The left ventricle demonstrates  global hypokinesis. There is mild left  ventricular hypertrophy. Left ventricular diastolic parameters are  consistent with Grade I diastolic dysfunction (impaired relaxation).   2. Right ventricular systolic function is normal. The right ventricular  size is normal. There is normal pulmonary artery systolic pressure.   3. Left atrial size was moderately dilated.   4. Right atrial size was mildly dilated.   5. The mitral valve is normal in structure. Mild mitral valve  regurgitation. No evidence of mitral stenosis.   6. The aortic valve is tricuspid. There is mild calcification of the  aortic valve. Aortic valve regurgitation is not visualized. Aortic valve  sclerosis/calcification is present,  without any evidence of aortic  stenosis.   7. The inferior vena cava is normal in size with greater than 50%  respiratory variability, suggesting right atrial pressure of 3 mmHg.     Neuro/Psych Seizures -,  Meningioma, s/p craniotomy    GI/Hepatic   Endo/Other  Hypothyroidism    Renal/GU      Musculoskeletal  (+) Arthritis ,  Osteoporosis    Abdominal   Peds  Hematology Lab Results      Component                Value               Date                      WBC                      13.5 (H)            10/13/2022                HGB                      14.9                10/13/2022                HCT                      45.5                10/13/2022  MCV                      93.2                10/13/2022                PLT                      286                 10/13/2022              Anesthesia Other Findings Right shin wound  Reproductive/Obstetrics                              Anesthesia Physical Anesthesia Plan  ASA: 3  Anesthesia Plan: General   Post-op Pain Management: Minimal or no pain anticipated   Induction: Intravenous  PONV Risk Score and Plan: 3 and Ondansetron , Dexamethasone  and Treatment may vary due to age or medical condition  Airway Management Planned: Oral ETT  Additional Equipment: None  Intra-op Plan:   Post-operative Plan: Extubation in OR  Informed Consent: I have reviewed the patients History and Physical, chart, labs and discussed the procedure including the risks, benefits and alternatives for the proposed anesthesia with the patient or authorized representative who has indicated his/her understanding and acceptance.     Dental advisory given  Plan Discussed with: CRNA and Surgeon  Anesthesia Plan Comments: (PAT note written 04/16/2024 by Allison Zelenak, PA-C.  )         Anesthesia Quick Evaluation

## 2024-04-29 ENCOUNTER — Encounter (HOSPITAL_COMMUNITY): Payer: Self-pay | Admitting: Radiology

## 2024-04-30 ENCOUNTER — Ambulatory Visit (HOSPITAL_COMMUNITY): Admitting: Physical Therapy

## 2024-04-30 DIAGNOSIS — M79661 Pain in right lower leg: Secondary | ICD-10-CM | POA: Diagnosis not present

## 2024-04-30 DIAGNOSIS — S81801D Unspecified open wound, right lower leg, subsequent encounter: Secondary | ICD-10-CM

## 2024-04-30 DIAGNOSIS — R600 Localized edema: Secondary | ICD-10-CM

## 2024-04-30 NOTE — Therapy (Signed)
 OUTPATIENT PHYSICAL THERAPY Wound Treatment   Patient Name: Penny Hicks MRN: [id number] DOB:[birth year], 73 y.o., female Today's Date: 04/30/2024   PCP: Norleen General  REFERRING PROVIDER: Rosette Russian  END OF SESSION:  PT End of Session - 04/30/24 1123     Visit Number 2    Number of Visits 12    Date for Recertification  06/11/24    Authorization Type no auth required    Authorization - Visit Number 2    Authorization - Number of Visits 12    Progress Note Due on Visit 10    PT Start Time 0913    PT Stop Time 0951    PT Time Calculation (min) 38 min    Activity Tolerance Patient limited by pain    Behavior During Therapy [facility] for tasks assessed/performed          Past Medical History:  Diagnosis Date   Arthritis    Chronic systolic CHF (congestive heart failure) (HCC) 05/2015   COPD (chronic obstructive pulmonary disease) (HCC)    Coronary artery disease    mild CAD in the mRCA 25-49% 10/302024 CCTA   Hypercholesterolemia    Hypertension    Hypothyroidism    Interatrial septal aneurysm with PFO    04/11/2023 CCTA: aneurysmal atrial septum bowing rightwards with PFO with left to right shunt   Meningioma, cerebral (HCC)    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   Multiple thyroid  nodules    Osteoporosis    Past Surgical History:  Procedure Laterality Date   APPLICATION OF CRANIAL NAVIGATION Left 10/10/2022   Procedure: APPLICATION OF CRANIAL NAVIGATION;  Surgeon: Carollee Lani BROCKS, DO;  [facility];  Service: Neurosurgery;  Laterality: Left;   CESAREAN SECTION  x 2   CRANIOTOMY Left 10/10/2022   Procedure: LEFT CRANIOTOMY TUMOR EXCISION;  Surgeon: Carollee Lani BROCKS, DO;  Location: [facility];  Service: Neurosurgery;  Laterality: Left;   HIP PINNING,CANNULATED Right 05/25/2015   Procedure: CANNULATED HIP PINNING;  Surgeon: Norleen Gavel, MD;  Location: [facility];  Service: Orthopedics;  Laterality: Right;   RADIOLOGY WITH ANESTHESIA N/A 10/08/2022    Procedure: MRI WITH ANESTHESIA;  Surgeon: Radiologist, Medication, MD;  Location: [facility];  Service: Radiology;  Laterality: N/A;   RADIOLOGY WITH ANESTHESIA N/A 04/28/2024   Procedure: MRI WITH ANESTHESIA;  Surgeon: Radiologist, Medication, MD;  [facility]: [facility];  Service: Radiology;  Laterality: N/A;  MRI OF BRAIN WITH AND WITHOUT CONTRAST   TONSILLECTOMY     Patient Active Problem List   Diagnosis Date Noted   Age-related osteoporosis without current pathological fracture 01/15/2024   Vaginal atrophy 12/11/2023   Encounter for well woman exam with routine gynecological exam 12/11/2023   Cardiomyopathy (HCC) 10/19/2022   LVH (left ventricular hypertrophy) 10/19/2022   Seizure (HCC) 10/05/2022   Legionella pneumonia (HCC) 02/17/2020   Sepsis due to pneumonia (HCC) 02/09/2020   Acute respiratory failure with hypoxia due to PNA/COPD 02/09/2020   Chronic HFrEF (heart failure with reduced ejection fraction) (HCC) 02/09/2020   CHF (congestive heart failure) (HCC)    Chronic systolic heart failure (HCC) 05/27/2015   Hip fracture, right (HCC) 05/24/2015   Hip fracture (HCC) 05/24/2015   TOBACCO ABUSE 07/05/2010   Essential hypertension 07/05/2010   GOITER, MULTINODULAR 06/21/2010   HYPERLIPIDEMIA 06/21/2010   Palpitations 06/21/2010   CHEST DISCOMFORT 06/21/2010    ONSET DATE: 04/01/24  REFERRING DIAG:  Diagnosis  S81.811D (ICD-10-CM) - Laceration without foreign body, right lower leg, subsequent  encounter    THERAPY DIAG:  Pain in right lower leg  Localized edema  Open leg wound, right, subsequent encounter  Rationale for Evaluation and Treatment: Rehabilitation     Wound Therapy - 04/30/24 0001     Subjective Pt states that she would not want any more compression on her leg than what was put on; but what was put on was fine.  Pt amazed at how much better her wound looks.    Patient and Family Stated Goals less pain and for the wound to heal    Date of Onset 04/01/24     Prior Treatments ER, urgent care and self care    Pain Scale 0-10    Pain Score 4    with debridement   Pain Type Acute pain    Pain Location Leg    Evaluation and Treatment Procedures Explained to Patient/Family Yes    Evaluation and Treatment Procedures agreed to    Wound Properties Date First Assessed: 04/24/24 Time First Assessed: 0940 Present on Original Admission: Yes Primary Wound Type: Traumatic Secondary Wound Type - Traumatic: Laceration Location: Pretibial Location Orientation: Right   Site / Wound Assessment Yellow    Peri-wound Assessment Edema    Drainage Description Serous    Drainage Amount Small    Treatments Cleansed;Moisturizing cream;Site care;Other (Comment)   manual, debridement and dressing change.   Dressing Type Compression wrap    Dressing Changed Changed    Dressing Status Old drainage    Wound Therapy - Clinical Statement see below    Wound Therapy - Functional Problem List see below    Factors Delaying/Impairing Wound Healing Infection - systemic/local;Vascular compromise    Hydrotherapy Plan Debridement;Dressing change;Patient/family education;Pulsatile lavage with suction    Wound Therapy - Frequency 2X / week    Wound Therapy - Current Recommendations PT    Wound Plan debridement, manlual and dressing change    Dressing  honey alginate, 4x4 followed by profore lite.    Manual Therapy decongestive techniques with lotion to decrease edema.            PATIENT EDUCATION: Education details: Keep dressing on unless it becomes painful.  Keep dressing dry Person educated: Patient and Child(ren) Education method: Explanation Education comprehension: verbalized understanding   HOME EXERCISE PROGRAM: none   GOALS: Goals reviewed with patient? Yes  SHORT TERM GOALS: Target date: 05/15/24  Pt pain to be no greater than a 4/10 Baseline: Goal status: IN PROGRESS  2.  Pt wound to be 80% granulated Baseline:  Goal status: IN PROGRESS   LONG TERM  GOALS: Target date: 06/11/24- extended due to holidays where clinic will be closed.   Pt pain to be no greater than a 2/10 Baseline:  Goal status: IN PROGRESS  2.  Pt wound to be 100% granulated to allow pt to be comfortable with self care.  Baseline:  Goal status: IN PROGRESS  3.  Wound to be no greater than 2x2 cm to allow pt to be comfortable with self care.  Baseline:  Goal status: IN PROGRESS   ASSESSMENT:  CLINICAL IMPRESSION: Pt wound has improved with decreased pain, decreased edema, and decreased erythema.  Therapist debrided wound to tolerance initially there was 0% granulation following debridement 20% granulation.   Ms. Scism will benefit from skilled PT to create a healing environment for the wound by debridement and decreasing the edema in her leg.    OBJECTIVE IMPAIRMENTS: difficulty walking, increased edema, pain, and decreased skin integrity.  ACTIVITY LIMITATIONS: bathing, dressing, hygiene/grooming, and locomotion level  PARTICIPATION LIMITATIONS: cleaning, shopping, and community activity  PERSONAL FACTORS: Time since onset of injury/illness/exacerbation and 1 comorbidity: infection  are also affecting patient's functional outcome.   REHAB POTENTIAL: Good  CLINICAL DECISION MAKING: Evolving/moderate complexity  EVALUATION COMPLEXITY: Moderate  PLAN: PT FREQUENCY: 2x/week  PT DURATION: 6 weeks  PLANNED INTERVENTIONS: 97535- Self Care, 02859- Manual therapy, 97597- Wound care (first 20 sq cm), 97598- Wound care (each additional 20 sq cm), and Patient/Family education  PLAN FOR NEXT SESSION: Continue to complete debridement/manual and compression  to ensure a healing environment is maintained.   Montie Metro, PT CLT [phone]  04/30/2024, 11:28 AM

## 2024-05-02 ENCOUNTER — Encounter (HOSPITAL_COMMUNITY): Payer: Self-pay

## 2024-05-02 ENCOUNTER — Ambulatory Visit (HOSPITAL_COMMUNITY)

## 2024-05-02 DIAGNOSIS — M79661 Pain in right lower leg: Secondary | ICD-10-CM | POA: Diagnosis not present

## 2024-05-02 DIAGNOSIS — R600 Localized edema: Secondary | ICD-10-CM

## 2024-05-02 DIAGNOSIS — S81801D Unspecified open wound, right lower leg, subsequent encounter: Secondary | ICD-10-CM

## 2024-05-02 NOTE — Therapy (Signed)
 OUTPATIENT PHYSICAL THERAPY Wound Treatment   Patient Name: Penny Hicks MRN: [id number] DOB:1950/07/25, 73 y.o., female Today's Date: 05/02/2024   PCP: Norleen General  REFERRING PROVIDER: Rosette Russian  END OF SESSION:  PT End of Session - 05/02/24 1111     Visit Number 3    Number of Visits 12    Date for Recertification  06/11/24    Authorization Type no auth required    Authorization - Visit Number 3    Authorization - Number of Visits 12    Progress Note Due on Visit 10    PT Start Time 1026    PT Stop Time 1105    PT Time Calculation (min) 39 min    Activity Tolerance Patient limited by pain    Behavior During Therapy [facility] for tasks assessed/performed          Past Medical History:  Diagnosis Date   Arthritis    Chronic systolic CHF (congestive heart failure) (HCC) 05/2015   COPD (chronic obstructive pulmonary disease) (HCC)    Coronary artery disease    mild CAD in the mRCA 25-49% 10/302024 CCTA   Hypercholesterolemia    Hypertension    Hypothyroidism    Interatrial septal aneurysm with PFO    04/11/2023 CCTA: aneurysmal atrial septum bowing rightwards with PFO with left to right shunt   Meningioma, cerebral (HCC)    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   Multiple thyroid  nodules    Osteoporosis    Past Surgical History:  Procedure Laterality Date   APPLICATION OF CRANIAL NAVIGATION Left 10/10/2022   Procedure: APPLICATION OF CRANIAL NAVIGATION;  Surgeon: Carollee Lani BROCKS, DO;  [facility];  Service: Neurosurgery;  Laterality: Left;   CESAREAN SECTION  x 2   CRANIOTOMY Left 10/10/2022   Procedure: LEFT CRANIOTOMY TUMOR EXCISION;  Surgeon: Carollee Lani BROCKS, DO;  [facility];  Service: Neurosurgery;  Laterality: Left;   HIP PINNING,CANNULATED Right 05/25/2015   Procedure: CANNULATED HIP PINNING;  Surgeon: Norleen Gavel, MD;  [facility];  Service: Orthopedics;  Laterality: Right;   RADIOLOGY WITH ANESTHESIA N/A 10/08/2022    Procedure: MRI WITH ANESTHESIA;  Surgeon: Radiologist, Medication, MD;  [facility];  Service: Radiology;  Laterality: N/A;   RADIOLOGY WITH ANESTHESIA N/A 04/28/2024   Procedure: MRI WITH ANESTHESIA;  Surgeon: Radiologist, Medication, MD;  [facility];  Service: Radiology;  Laterality: N/A;  MRI OF BRAIN WITH AND WITHOUT CONTRAST   TONSILLECTOMY     Patient Active Problem List   Diagnosis Date Noted   Age-related osteoporosis without current pathological fracture 01/15/2024   Vaginal atrophy 12/11/2023   Encounter for well woman exam with routine gynecological exam 12/11/2023   Cardiomyopathy (HCC) 10/19/2022   LVH (left ventricular hypertrophy) 10/19/2022   Seizure (HCC) 10/05/2022   Legionella pneumonia (HCC) 02/17/2020   Sepsis due to pneumonia (HCC) 02/09/2020   Acute respiratory failure with hypoxia due to PNA/COPD 02/09/2020   Chronic HFrEF (heart failure with reduced ejection fraction) (HCC) 02/09/2020   CHF (congestive heart failure) (HCC)    Chronic systolic heart failure (HCC) 05/27/2015   Hip fracture, right (HCC) 05/24/2015   Hip fracture (HCC) 05/24/2015   TOBACCO ABUSE 07/05/2010   Essential hypertension 07/05/2010   GOITER, MULTINODULAR 06/21/2010   HYPERLIPIDEMIA 06/21/2010   Palpitations 06/21/2010   CHEST DISCOMFORT 06/21/2010    ONSET DATE: 04/01/24  REFERRING DIAG:  Diagnosis  S81.811D (ICD-10-CM) - Laceration without foreign body, right lower leg, subsequent  encounter    THERAPY DIAG:  Pain in right lower leg  Localized edema  Open leg wound, right, subsequent encounter  Rationale for Evaluation and Treatment: Rehabilitation   Media Information  Document Information  Photos  Right shin wound  05/02/2024 00:01  Attached To:  Outpatient Rehab on 05/02/24 with Renae Augustin PARAS, PTA  Source Information  Renae Augustin PARAS, PTA  [facility]      Wound Therapy - 05/02/24 0001     Subjective Pt stated she has some itching,  pain scale 7/10 today. Impressed with swelling going down.    Patient and Family Stated Goals less pain and for the wound to heal    Date of Onset 04/01/24    Prior Treatments ER, urgent care and self care    Pain Scale 0-10    Pain Score 7     Pain Type Acute pain    Pain Location Leg    Evaluation and Treatment Procedures Explained to Patient/Family Yes    Evaluation and Treatment Procedures agreed to    Wound Properties Date First Assessed: 04/24/24 Time First Assessed: 0940 Present on Original Admission: Yes Primary Wound Type: Traumatic Secondary Wound Type - Traumatic: Laceration Location: Pretibial Location Orientation: Right   Wound Image Images linked: 1    Site / Wound Assessment Yellow;Pink    Peri-wound Assessment Edema    Drainage Description Serous    Drainage Amount Small    Treatments Cleansed   Manua,, debridement and dressing change   Dressing Type --   Medihoney, lotion/vaseline, profore lite with netting   Dressing Changed Changed    Dressing Status Old drainage    Wound Therapy - Clinical Statement see below    Wound Therapy - Functional Problem List see below    Factors Delaying/Impairing Wound Healing Infection - systemic/local;Vascular compromise    Hydrotherapy Plan Debridement;Dressing change;Patient/family education;Pulsatile lavage with suction    Wound Therapy - Frequency 2X / week    Wound Therapy - Current Recommendations PT    Wound Plan debridement, manlual and dressing change    Dressing  honey alginate, 4x4 followed by profore lite.    Manual Therapy decongestive techniques with lotion to decrease edema.            PATIENT EDUCATION: Education details: Keep dressing on unless it becomes painful.  Keep dressing dry Person educated: Patient and Child(ren) Education method: Explanation Education comprehension: verbalized understanding   HOME EXERCISE PROGRAM: none   GOALS: Goals reviewed with patient? Yes  SHORT TERM GOALS: Target  date: 05/15/24  Pt pain to be no greater than a 4/10 Baseline: Goal status: IN PROGRESS  2.  Pt wound to be 80% granulated Baseline:  Goal status: IN PROGRESS   LONG TERM GOALS: Target date: 06/11/24- extended due to holidays where clinic will be closed.   Pt pain to be no greater than a 2/10 Baseline:  Goal status: IN PROGRESS  2.  Pt wound to be 100% granulated to allow pt to be comfortable with self care.  Baseline:  Goal status: IN PROGRESS  3.  Wound to be no greater than 2x2 cm to allow pt to be comfortable with self care.  Baseline:  Goal status: IN PROGRESS   ASSESSMENT:  CLINICAL IMPRESSION: Selective debridement for removal of slough from wound bed and dry skin perimeter of wound to promote healing.  Pt limited by pain, given stress ball to improve tolerance during treatment.  Improved granulation following granulation to 25%, continues  to have slough in wound bed.  Manual decongestive technqiues complete to assist with edema.  Continued with medihoney and profore for edema control.     OBJECTIVE IMPAIRMENTS: difficulty walking, increased edema, pain, and decreased skin integrity.   ACTIVITY LIMITATIONS: bathing, dressing, hygiene/grooming, and locomotion level  PARTICIPATION LIMITATIONS: cleaning, shopping, and community activity  PERSONAL FACTORS: Time since onset of injury/illness/exacerbation and 1 comorbidity: infection  are also affecting patient's functional outcome.   REHAB POTENTIAL: Good  CLINICAL DECISION MAKING: Evolving/moderate complexity  EVALUATION COMPLEXITY: Moderate  PLAN: PT FREQUENCY: 2x/week  PT DURATION: 6 weeks  PLANNED INTERVENTIONS: 97535- Self Care, 02859- Manual therapy, 97597- Wound care (first 20 sq cm), 97598- Wound care (each additional 20 sq cm), and Patient/Family education  PLAN FOR NEXT SESSION: Continue to complete debridement/manual and compression  to ensure a healing environment is maintained.   Augustin Mclean,  LPTA/CLT; WILLAIM [phone]  05/02/2024, 5:22 PM

## 2024-05-05 ENCOUNTER — Ambulatory Visit (HOSPITAL_COMMUNITY): Admitting: Physical Therapy

## 2024-05-05 DIAGNOSIS — S81801D Unspecified open wound, right lower leg, subsequent encounter: Secondary | ICD-10-CM

## 2024-05-05 DIAGNOSIS — M79661 Pain in right lower leg: Secondary | ICD-10-CM | POA: Diagnosis not present

## 2024-05-05 DIAGNOSIS — R600 Localized edema: Secondary | ICD-10-CM

## 2024-05-05 NOTE — Therapy (Signed)
 OUTPATIENT PHYSICAL THERAPY Wound Treatment   Patient Name: Penny Hicks MRN: [id number] DOB:[birth year], 73 y.o., female Today's Date: 05/05/2024   PCP: Norleen General  REFERRING PROVIDER: Rosette Russian  END OF SESSION:  PT End of Session - 05/05/24 0941     Visit Number 4    Number of Visits 12    Date for Recertification  06/11/24    Authorization Type no auth required    Authorization - Visit Number 4    Authorization - Number of Visits 12    Progress Note Due on Visit 10    PT Start Time 0812    PT Stop Time 0848    PT Time Calculation (min) 36 min    Activity Tolerance Patient limited by pain    Behavior During Therapy [facility] for tasks assessed/performed          Past Medical History:  Diagnosis Date   Arthritis    Chronic systolic CHF (congestive heart failure) (HCC) 05/2015   COPD (chronic obstructive pulmonary disease) (HCC)    Coronary artery disease    mild CAD in the mRCA 25-49% 10/302024 CCTA   Hypercholesterolemia    Hypertension    Hypothyroidism    Interatrial septal aneurysm with PFO    04/11/2023 CCTA: aneurysmal atrial septum bowing rightwards with PFO with left to right shunt   Meningioma, cerebral (HCC)    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   Multiple thyroid  nodules    Osteoporosis    Past Surgical History:  Procedure Laterality Date   APPLICATION OF CRANIAL NAVIGATION Left 10/10/2022   Procedure: APPLICATION OF CRANIAL NAVIGATION;  Surgeon: Carollee Lani BROCKS, DO;  [facility];  Service: Neurosurgery;  Laterality: Left;   CESAREAN SECTION  x 2   CRANIOTOMY Left 10/10/2022   Procedure: LEFT CRANIOTOMY TUMOR EXCISION;  Surgeon: Carollee Lani BROCKS, DO;  [facility];  Service: Neurosurgery;  Laterality: Left;   HIP PINNING,CANNULATED Right 05/25/2015   Procedure: CANNULATED HIP PINNING;  Surgeon: Norleen Gavel, MD;  [facility];  Service: Orthopedics;  Laterality: Right;   RADIOLOGY WITH ANESTHESIA N/A 10/08/2022    Procedure: MRI WITH ANESTHESIA;  Surgeon: Radiologist, Medication, MD;  [facility];  Service: Radiology;  Laterality: N/A;   RADIOLOGY WITH ANESTHESIA N/A 04/28/2024   Procedure: MRI WITH ANESTHESIA;  Surgeon: Radiologist, Medication, MD;  [facility];  Service: Radiology;  Laterality: N/A;  MRI OF BRAIN WITH AND WITHOUT CONTRAST   TONSILLECTOMY     Patient Active Problem List   Diagnosis Date Noted   Age-related osteoporosis without current pathological fracture 01/15/2024   Vaginal atrophy 12/11/2023   Encounter for well woman exam with routine gynecological exam 12/11/2023   Cardiomyopathy (HCC) 10/19/2022   LVH (left ventricular hypertrophy) 10/19/2022   Seizure (HCC) 10/05/2022   Legionella pneumonia (HCC) 02/17/2020   Sepsis due to pneumonia (HCC) 02/09/2020   Acute respiratory failure with hypoxia due to PNA/COPD 02/09/2020   Chronic HFrEF (heart failure with reduced ejection fraction) (HCC) 02/09/2020   CHF (congestive heart failure) (HCC)    Chronic systolic heart failure (HCC) 05/27/2015   Hip fracture, right (HCC) 05/24/2015   Hip fracture (HCC) 05/24/2015   TOBACCO ABUSE 07/05/2010   Essential hypertension 07/05/2010   GOITER, MULTINODULAR 06/21/2010   HYPERLIPIDEMIA 06/21/2010   Palpitations 06/21/2010   CHEST DISCOMFORT 06/21/2010    ONSET DATE: 04/01/24  REFERRING DIAG:  Diagnosis  S81.811D (ICD-10-CM) - Laceration without foreign body, right lower leg, subsequent  encounter    THERAPY DIAG:  Pain in right lower leg  Localized edema  Open leg wound, right, subsequent encounter  Rationale for Evaluation and Treatment: Rehabilitation      Wound Therapy - 05/05/24 0001     Subjective Pt states she cancelled her MD appointment to take the stitches out as it was the same time as this appointment.    Patient and Family Stated Goals less pain and for the wound to heal    Date of Onset 04/01/24    Prior Treatments ER, urgent care and self care     Pain Scale 0-10    Pain Score 7    when wound is being debrided   Pain Type Acute pain    Pain Location Leg    Pain Intervention(s) Distraction    Evaluation and Treatment Procedures Explained to Patient/Family Yes    Evaluation and Treatment Procedures agreed to    Wound Properties Date First Assessed: 04/24/24 Time First Assessed: 0940 Present on Original Admission: Yes Primary Wound Type: Traumatic Secondary Wound Type - Traumatic: Laceration Location: Pretibial Location Orientation: Right   Site / Wound Assessment Yellow;Pink    Peri-wound Assessment Edema    Drainage Description Serous    Drainage Amount Small    Treatments Cleansed;Moisturizing cream;Site care;Other (Comment)   debridement using forceps and scissors.  Wound 35% granulated following debridement.   Dressing Type --   Medihoney, lotion/vaseline, profore lite with netting   Dressing Changed Changed    Dressing Status New drainage    Wound Therapy - Clinical Statement see below    Wound Therapy - Functional Problem List see below    Factors Delaying/Impairing Wound Healing Infection - systemic/local;Vascular compromise    Hydrotherapy Plan Debridement;Dressing change;Patient/family education;Pulsatile lavage with suction    Wound Therapy - Frequency 2X / week    Wound Therapy - Current Recommendations PT    Wound Plan debridement, manual and dressing change    Dressing  honey alginate, 4x4 followed by profore lite.    Manual Therapy decongestive techniques with lotion to decrease edema.            PATIENT EDUCATION: Education details: Keep dressing on unless it becomes painful.  Keep dressing dry Person educated: Patient and Child(ren) Education method: Explanation Education comprehension: verbalized understanding   HOME EXERCISE PROGRAM: none   GOALS: Goals reviewed with patient? Yes  SHORT TERM GOALS: Target date: 05/15/24  Pt pain to be no greater than a 4/10 Baseline: Goal status: IN  PROGRESS  2.  Pt wound to be 80% granulated Baseline:  Goal status: IN PROGRESS   LONG TERM GOALS: Target date: 06/11/24- extended due to holidays where clinic will be closed.   Pt pain to be no greater than a 2/10 Baseline:  Goal status: IN PROGRESS  2.  Pt wound to be 100% granulated to allow pt to be comfortable with self care.  Baseline:  Goal status: IN PROGRESS  3.  Wound to be no greater than 2x2 cm to allow pt to be comfortable with self care.  Baseline:  Goal status: IN PROGRESS   ASSESSMENT:  CLINICAL IMPRESSION:   Stitches removed.  Selective debridement for removal of slough from wound bed  to promote healing.  Pt limited by pain, given stress ball to improve tolerance during treatment.  Improved granulation following granulation to 35%, continues to have slough in wound bed especially along superior aspect which undermines and appears to be deep.    Manual  decongestive technqiues complete to assist with edema.  Continued with medihoney and profore for edema control.     OBJECTIVE IMPAIRMENTS: difficulty walking, increased edema, pain, and decreased skin integrity.   ACTIVITY LIMITATIONS: bathing, dressing, hygiene/grooming, and locomotion level  PARTICIPATION LIMITATIONS: cleaning, shopping, and community activity  PERSONAL FACTORS: Time since onset of injury/illness/exacerbation and 1 comorbidity: infection  are also affecting patient's functional outcome.   REHAB POTENTIAL: Good  CLINICAL DECISION MAKING: Evolving/moderate complexity  EVALUATION COMPLEXITY: Moderate  PLAN: PT FREQUENCY: 2x/week  PT DURATION: 6 weeks  PLANNED INTERVENTIONS: 97535- Self Care, 02859- Manual therapy, 97597- Wound care (first 20 sq cm), 97598- Wound care (each additional 20 sq cm), and Patient/Family education  PLAN FOR NEXT SESSION: Measure and photo next treatment.  Continue to complete debridement/manual and compression  to ensure a healing environment is maintained.    Montie Metro, PT CLT  [phone]  05/05/2024, 9:46 AM

## 2024-05-13 ENCOUNTER — Ambulatory Visit (HOSPITAL_COMMUNITY): Admitting: Physical Therapy

## 2024-05-13 DIAGNOSIS — M79661 Pain in right lower leg: Secondary | ICD-10-CM | POA: Insufficient documentation

## 2024-05-13 DIAGNOSIS — S81801D Unspecified open wound, right lower leg, subsequent encounter: Secondary | ICD-10-CM | POA: Insufficient documentation

## 2024-05-13 DIAGNOSIS — R600 Localized edema: Secondary | ICD-10-CM | POA: Diagnosis present

## 2024-05-13 NOTE — Therapy (Signed)
 OUTPATIENT PHYSICAL THERAPY Wound Treatment   Patient Name: Penny Hicks MRN: [id number] DOB:1950/08/05, 73 y.o., female Today's Date: 05/13/2024   PCP: Norleen General  REFERRING PROVIDER: Rosette Russian  END OF SESSION:  PT End of Session - 05/13/24 1042     Visit Number 5    Number of Visits 12    Date for Recertification  06/11/24    Authorization Type no auth required    Authorization - Visit Number 5    Authorization - Number of Visits 12    Progress Note Due on Visit 10    PT Start Time 1006    PT Stop Time 1037    PT Time Calculation (min) 31 min    Activity Tolerance Patient limited by pain    Behavior During Therapy [facility] for tasks assessed/performed          Past Medical History:  Diagnosis Date   Arthritis    Chronic systolic CHF (congestive heart failure) (HCC) 05/2015   COPD (chronic obstructive pulmonary disease) (HCC)    Coronary artery disease    mild CAD in the mRCA 25-49% 10/302024 CCTA   Hypercholesterolemia    Hypertension    Hypothyroidism    Interatrial septal aneurysm with PFO    04/11/2023 CCTA: aneurysmal atrial septum bowing rightwards with PFO with left to right shunt   Meningioma, cerebral (HCC)    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   Multiple thyroid  nodules    Osteoporosis    Past Surgical History:  Procedure Laterality Date   APPLICATION OF CRANIAL NAVIGATION Left 10/10/2022   Procedure: APPLICATION OF CRANIAL NAVIGATION;  Surgeon: Carollee Lani BROCKS, DO;  Location: [facility] OR;  Service: Neurosurgery;  Laterality: Left;   CESAREAN SECTION  x 2   CRANIOTOMY Left 10/10/2022   Procedure: LEFT CRANIOTOMY TUMOR EXCISION;  Surgeon: Carollee Lani BROCKS, DO;  Location: [facility] OR;  Service: Neurosurgery;  Laterality: Left;   HIP PINNING,CANNULATED Right 05/25/2015   Procedure: CANNULATED HIP PINNING;  Surgeon: Norleen Gavel, MD;  Location: [facility] OR;  Service: Orthopedics;  Laterality: Right;   RADIOLOGY WITH ANESTHESIA N/A 10/08/2022    Procedure: MRI WITH ANESTHESIA;  Surgeon: Radiologist, Medication, MD;  Location: [facility] OR;  Service: Radiology;  Laterality: N/A;   RADIOLOGY WITH ANESTHESIA N/A 04/28/2024   Procedure: MRI WITH ANESTHESIA;  Surgeon: Radiologist, Medication, MD;  Location: [facility] OR;  Service: Radiology;  Laterality: N/A;  MRI OF BRAIN WITH AND WITHOUT CONTRAST   TONSILLECTOMY     Patient Active Problem List   Diagnosis Date Noted   Age-related osteoporosis without current pathological fracture 01/15/2024   Vaginal atrophy 12/11/2023   Encounter for well woman exam with routine gynecological exam 12/11/2023   Cardiomyopathy (HCC) 10/19/2022   LVH (left ventricular hypertrophy) 10/19/2022   Seizure (HCC) 10/05/2022   Legionella pneumonia (HCC) 02/17/2020   Sepsis due to pneumonia (HCC) 02/09/2020   Acute respiratory failure with hypoxia due to PNA/COPD 02/09/2020   Chronic HFrEF (heart failure with reduced ejection fraction) (HCC) 02/09/2020   CHF (congestive heart failure) (HCC)    Chronic systolic heart failure (HCC) 05/27/2015   Hip fracture, right (HCC) 05/24/2015   Hip fracture (HCC) 05/24/2015   TOBACCO ABUSE 07/05/2010   Essential hypertension 07/05/2010   GOITER, MULTINODULAR 06/21/2010   HYPERLIPIDEMIA 06/21/2010   Palpitations 06/21/2010   CHEST DISCOMFORT 06/21/2010    ONSET DATE: 04/01/24  REFERRING DIAG:  Diagnosis  S81.811D (ICD-10-CM) - Laceration without foreign body, right lower leg, subsequent  encounter    THERAPY DIAG:  Pain in right lower leg  Localized edema  Open leg wound, right, subsequent encounter  Rationale for Evaluation and Treatment: Rehabilitation      Wound Therapy - 05/13/24 0001     Subjective Pt states the compression bothers her.    Patient and Family Stated Goals less pain and for the wound to heal    Date of Onset 04/01/24    Prior Treatments ER, urgent care and self care    Pain Scale 0-10    Pain Score 4     Pain Type Acute pain    Pain Location  Leg    Pain Intervention(s) Distraction    Evaluation and Treatment Procedures Explained to Patient/Family Yes    Evaluation and Treatment Procedures agreed to    Wound Properties Date First Assessed: 04/24/24 Time First Assessed: 0940 Present on Original Admission: Yes Primary Wound Type: Traumatic Secondary Wound Type - Traumatic: Laceration Location: Pretibial Location Orientation: Right   Site / Wound Assessment Yellow;Pink    Peri-wound Assessment Edema    Wound Length (cm) 2.2 cm    Wound Width (cm) 2.2 cm    Wound Surface Area (cm^2) 3.8 cm^2    Wound Depth (cm) 0.4 cm    Wound Volume (cm^3) 1.014 cm^3    Drainage Description Serous    Drainage Amount Small    Treatments Cleansed;Moisturizing cream;Site care;Other (Comment)   debridement   Dressing Type Gauze (Comment)    Dressing Changed Changed    Dressing Status New drainage    Wound Therapy - Clinical Statement see below    Wound Therapy - Functional Problem List see below    Factors Delaying/Impairing Wound Healing Infection - systemic/local;Vascular compromise    Hydrotherapy Plan Debridement;Dressing change;Patient/family education;Pulsatile lavage with suction    Wound Therapy - Frequency 2X / week    Wound Therapy - Current Recommendations PT    Wound Plan debridement, manual and dressing change    Dressing  honey alginate, 4x4 followed by coban    Manual Therapy decongestive techniques with lotion to decrease edema.            PATIENT EDUCATION: Education details: Keep dressing on unless it becomes painful.  Keep dressing dry Person educated: Patient and Child(ren) Education method: Explanation Education comprehension: verbalized understanding   HOME EXERCISE PROGRAM: none   GOALS: Goals reviewed with patient? Yes  SHORT TERM GOALS: Target date: 05/15/24  Pt pain to be no greater than a 4/10 Baseline: Goal status: IN PROGRESS  2.  Pt wound to be 80% granulated Baseline:  Goal status: IN  PROGRESS   LONG TERM GOALS: Target date: 06/11/24- extended due to holidays where clinic will be closed.   Pt pain to be no greater than a 2/10 Baseline:  Goal status: IN PROGRESS  2.  Pt wound to be 100% granulated to allow pt to be comfortable with self care.  Baseline:  Goal status: IN PROGRESS  3.  Wound to be no greater than 2x2 cm to allow pt to be comfortable with self care.  Baseline:  Goal status: IN PROGRESS   ASSESSMENT:  CLINICAL IMPRESSION:    Selective debridement for removal of slough from wound bed  to promote healing.  Following debridement wound is 70%granulated.  Wound has increased depth along superior aspect which was noted following debridement.  Wound also undermines superiorly.  Therapist minimized compression due to pt stating that it is bothering her after 3-4 days, (states  it's the bulkiness of it no pain), therefore she takes it off.  Pt comes without compression bandaging at this time and therapist notes increased edema.  Pt limited by pain, given stress ball to improve tolerance during treatment.  PT measured for knee high compression garments and given sheet for ETI OBJECTIVE IMPAIRMENTS: difficulty walking, increased edema, pain, and decreased skin integrity.   ACTIVITY LIMITATIONS: bathing, dressing, hygiene/grooming, and locomotion level  PARTICIPATION LIMITATIONS: cleaning, shopping, and community activity  PERSONAL FACTORS: Time since onset of injury/illness/exacerbation and 1 comorbidity: infection  are also affecting patient's functional outcome.   REHAB POTENTIAL: Good  CLINICAL DECISION MAKING: Evolving/moderate complexity  EVALUATION COMPLEXITY: Moderate  PLAN: PT FREQUENCY: 2x/week  PT DURATION: 6 weeks  PLANNED INTERVENTIONS: 97535- Self Care, 02859- Manual therapy, 97597- Wound care (first 20 sq cm), 97598- Wound care (each additional 20 sq cm), and Patient/Family education  PLAN FOR NEXT SESSION: Measure and photo next treatment.   Continue to complete debridement/manual and compression  to ensure a healing environment is maintained.   Montie Metro, PT CLT  [phone]  05/13/2024, 10:46 AM

## 2024-05-15 ENCOUNTER — Ambulatory Visit (HOSPITAL_COMMUNITY): Admitting: Physical Therapy

## 2024-05-15 DIAGNOSIS — M79661 Pain in right lower leg: Secondary | ICD-10-CM

## 2024-05-15 DIAGNOSIS — R600 Localized edema: Secondary | ICD-10-CM

## 2024-05-15 DIAGNOSIS — S81801D Unspecified open wound, right lower leg, subsequent encounter: Secondary | ICD-10-CM

## 2024-05-15 NOTE — Therapy (Signed)
 OUTPATIENT PHYSICAL THERAPY Wound Treatment   Patient Name: Penny Hicks MRN: [id number] DOB:March 21, 1951, 73 y.o., female Today's Date: 05/15/2024   PCP: Norleen General  REFERRING PROVIDER: Rosette Russian  END OF SESSION:  PT End of Session - 05/15/24 1334     Visit Number 6    Number of Visits 12    Date for Recertification  06/11/24    Authorization Type no auth required    Authorization - Visit Number 6    Authorization - Number of Visits 12    Progress Note Due on Visit 10    PT Start Time 1020    PT Stop Time 1053    PT Time Calculation (min) 33 min    Activity Tolerance Patient limited by pain    Behavior During Therapy [facility] for tasks assessed/performed           Past Medical History:  Diagnosis Date   Arthritis    Chronic systolic CHF (congestive heart failure) (HCC) 05/2015   COPD (chronic obstructive pulmonary disease) (HCC)    Coronary artery disease    mild CAD in the mRCA 25-49% 10/302024 CCTA   Hypercholesterolemia    Hypertension    Hypothyroidism    Interatrial septal aneurysm with PFO    04/11/2023 CCTA: aneurysmal atrial septum bowing rightwards with PFO with left to right shunt   Meningioma, cerebral (HCC)    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   Multiple thyroid  nodules    Osteoporosis    Past Surgical History:  Procedure Laterality Date   APPLICATION OF CRANIAL NAVIGATION Left 10/10/2022   Procedure: APPLICATION OF CRANIAL NAVIGATION;  Surgeon: Carollee Lani BROCKS, DO;  Location: [facility] OR;  Service: Neurosurgery;  Laterality: Left;   CESAREAN SECTION  x 2   CRANIOTOMY Left 10/10/2022   Procedure: LEFT CRANIOTOMY TUMOR EXCISION;  Surgeon: Carollee Lani BROCKS, DO;  Location: [facility];  Service: Neurosurgery;  Laterality: Left;   HIP PINNING,CANNULATED Right 05/25/2015   Procedure: CANNULATED HIP PINNING;  Surgeon: Norleen Gavel, MD;  Location: [facility] OR;  Service: Orthopedics;  Laterality: Right;   RADIOLOGY WITH ANESTHESIA N/A 10/08/2022    Procedure: MRI WITH ANESTHESIA;  Surgeon: Radiologist, Medication, MD;  Location: [facility];  Service: Radiology;  Laterality: N/A;   RADIOLOGY WITH ANESTHESIA N/A 04/28/2024   Procedure: MRI WITH ANESTHESIA;  Surgeon: Radiologist, Medication, MD;  Location: [facility];  Service: Radiology;  Laterality: N/A;  MRI OF BRAIN WITH AND WITHOUT CONTRAST   TONSILLECTOMY     Patient Active Problem List   Diagnosis Date Noted   Age-related osteoporosis without current pathological fracture 01/15/2024   Vaginal atrophy 12/11/2023   Encounter for well woman exam with routine gynecological exam 12/11/2023   Cardiomyopathy (HCC) 10/19/2022   LVH (left ventricular hypertrophy) 10/19/2022   Seizure (HCC) 10/05/2022   Legionella pneumonia (HCC) 02/17/2020   Sepsis due to pneumonia (HCC) 02/09/2020   Acute respiratory failure with hypoxia due to PNA/COPD 02/09/2020   Chronic HFrEF (heart failure with reduced ejection fraction) (HCC) 02/09/2020   CHF (congestive heart failure) (HCC)    Chronic systolic heart failure (HCC) 05/27/2015   Hip fracture, right (HCC) 05/24/2015   Hip fracture (HCC) 05/24/2015   TOBACCO ABUSE 07/05/2010   Essential hypertension 07/05/2010   GOITER, MULTINODULAR 06/21/2010   HYPERLIPIDEMIA 06/21/2010   Palpitations 06/21/2010   CHEST DISCOMFORT 06/21/2010    ONSET DATE: 04/01/24  REFERRING DIAG:  Diagnosis  S81.811D (ICD-10-CM) - Laceration without foreign body, right lower leg,  subsequent encounter    THERAPY DIAG:  Pain in right lower leg  Localized edema  Open leg wound, right, subsequent encounter  Rationale for Evaluation and Treatment: Rehabilitation    Wound Therapy - 05/15/24 0001     Subjective PT states that the dressing was sliding down    Patient and Family Stated Goals less pain and for the wound to heal .  Pt has ordered her compression garment.    Date of Onset 04/01/24    Prior Treatments ER, urgent care and self care    Pain Scale 0-10    Pain  Score 2     Pain Type Acute pain    Pain Location Leg    Pain Intervention(s) Distraction    Evaluation and Treatment Procedures Explained to Patient/Family Yes    Evaluation and Treatment Procedures agreed to    Wound Properties Date First Assessed: 04/24/24 Time First Assessed: 0940 Present on Original Admission: Yes Primary Wound Type: Traumatic Secondary Wound Type - Traumatic: Laceration Location: Pretibial Location Orientation: Right   Wound Image Images linked: 1    Site / Wound Assessment Yellow;Pink    Peri-wound Assessment Edema    Wound Length (cm) 2.2 cm    Wound Width (cm) 2.2 cm    Wound Surface Area (cm^2) 3.8 cm^2    Wound Depth (cm) 0.2 cm   wound tunnels superiorly.3 cm   Wound Volume (cm^3) 0.507 cm^3    Drainage Description Serous    Drainage Amount Small    Treatments Cleansed;Site care;Moisturizing cream;Other (Comment)   debridement of slough and biofilm in the wound bed with forceps.   Dressing Type Gauze (Comment)    Dressing Changed Changed    Dressing Status New drainage;Old drainage    Wound Therapy - Clinical Statement see below    Wound Therapy - Functional Problem List see below    Factors Delaying/Impairing Wound Healing Infection - systemic/local;Vascular compromise    Hydrotherapy Plan Debridement;Dressing change;Patient/family education;Pulsatile lavage with suction    Wound Therapy - Frequency 2X / week    Wound Therapy - Current Recommendations PT    Wound Plan debridement, manual and dressing change    Dressing  honey alginate, 4x4 followed by coban    Manual Therapy decongestive techniques with lotion to decrease edema.             PATIENT EDUCATION: Education details: Keep dressing on unless it becomes painful.  Keep dressing dry Person educated: Patient and Child(ren) Education method: Explanation Education comprehension: verbalized understanding   HOME EXERCISE PROGRAM: none   GOALS: Goals reviewed with patient? Yes  SHORT  TERM GOALS: Target date: 05/15/24  Pt pain to be no greater than a 4/10 Baseline: Goal status: MET  2.  Pt wound to be 80% granulated Baseline:  Goal status: IN PROGRESS   LONG TERM GOALS: Target date: 06/11/24- extended due to holidays where clinic will be closed.   Pt pain to be no greater than a 2/10 Baseline:  Goal status: IN PROGRESS  2.  Pt wound to be 100% granulated to allow pt to be comfortable with self care.  Baseline:  Goal status: IN PROGRESS  3.  Wound to be no greater than 2x2 cm to allow pt to be comfortable with self care.  Baseline:  Goal status: IN PROGRESS   ASSESSMENT:  CLINICAL IMPRESSION:    Selective debridement for removal of slough from wound bed  to promote healing.  Following debridement wound is 80% granulated.  Wound depth is improving.  Wound  undermines superiorly 0.3 cm.  PT pain level notably decreased.  Pt will continue to benefit from skilled PT to ensure a healing environment is maintained to assist in the prevention of cellulitis.  OBJECTIVE IMPAIRMENTS: difficulty walking, increased edema, pain, and decreased skin integrity.   ACTIVITY LIMITATIONS: bathing, dressing, hygiene/grooming, and locomotion level  PARTICIPATION LIMITATIONS: cleaning, shopping, and community activity  PERSONAL FACTORS: Time since onset of injury/illness/exacerbation and 1 comorbidity: infection  are also affecting patient's functional outcome.   REHAB POTENTIAL: Good  CLINICAL DECISION MAKING: Evolving/moderate complexity  EVALUATION COMPLEXITY: Moderate  PLAN: PT FREQUENCY: 2x/week  PT DURATION: 6 weeks  PLANNED INTERVENTIONS: 97535- Self Care, 02859- Manual therapy, 97597- Wound care (first 20 sq cm), 97598- Wound care (each additional 20 sq cm), and Patient/Family education  PLAN FOR NEXT SESSION:  Continue to complete debridement/manual and compression  to ensure a healing environment is maintained.   Montie Metro, PT  CLT  [phone]  05/15/2024, 1:38 PM

## 2024-05-20 ENCOUNTER — Ambulatory Visit (HOSPITAL_COMMUNITY): Admitting: Physical Therapy

## 2024-05-20 DIAGNOSIS — M79661 Pain in right lower leg: Secondary | ICD-10-CM | POA: Diagnosis not present

## 2024-05-20 DIAGNOSIS — R600 Localized edema: Secondary | ICD-10-CM

## 2024-05-20 DIAGNOSIS — S81801D Unspecified open wound, right lower leg, subsequent encounter: Secondary | ICD-10-CM

## 2024-05-20 NOTE — Progress Notes (Signed)
   05/20/24 0001  Subjective Assessment  Subjective PT states that the new dressing was much more comfortable for her.  Patient and Family Stated Goals less pain and for the wound to heal  Date of Onset 04/01/24  Prior Treatments ER, urgent care and self care  Pain Assessment  Pain Scale 0-10  Pain Score 2  Pain Type Acute pain  Pain Location Leg  Pain Intervention(s) Emotional support  Evaluation and Treatment  Evaluation and Treatment Procedures Explained to Patient/Family Yes  Evaluation and Treatment Procedures agreed to  Wound 04/24/24 0940 Traumatic Pretibial Right  Date First Assessed/Time First Assessed: 04/24/24 0940   Present on Original Admission: Yes  Primary Wound Type: Traumatic  Secondary Wound Type - Traumatic: Laceration  Location: Pretibial  Location Orientation: Right  Site / Wound Assessment Yellow;Pink  Peri-wound Assessment Edema  Drainage Description Serous  Drainage Amount Small  Treatments Cleansed;Site care;Irrigation;Moisturizing cream;Other (Comment) (wound covered with biofilm.  Therapist debrided biofilm with forceps. Wound has noted superior tunnel going at least .3 cm.)  Dressing Type Gauze (Comment)  Dressing Changed Changed  Dressing Status New drainage;Old drainage  Wound Therapy - Assess/Plan/Recommendations  Wound Therapy - Clinical Statement see below  Wound Therapy - Functional Problem List see below  Factors Delaying/Impairing Wound Healing Infection - systemic/local;Vascular compromise  Hydrotherapy Plan Debridement;Dressing change;Patient/family education;Pulsatile lavage with suction  Wound Therapy - Frequency 2X / week  Wound Therapy - Current Recommendations PT  Wound Plan debridement, manual and dressing change  Wound Therapy  Dressing  xeroform, cotton at ankle,, 4x4, kerlix  followed by coban  Manual Therapy decongestive techniques with lotion to decrease edema.

## 2024-05-20 NOTE — Therapy (Signed)
 OUTPATIENT PHYSICAL THERAPY Wound Treatment   Patient Name: Penny Hicks MRN: [id number] DOB:1951/02/23, 73 y.o., female Today's Date: 05/20/2024   PCP: Norleen General  REFERRING PROVIDER: Rosette Russian  END OF SESSION:  PT End of Session - 05/20/24 0913     Visit Number 7    Number of Visits 12    Date for Recertification  06/11/24    Authorization Type no auth required    Authorization - Visit Number 7    Authorization - Number of Visits 12    Progress Note Due on Visit 10    PT Start Time 0816    PT Stop Time 0847    PT Time Calculation (min) 31 min    Activity Tolerance Patient limited by pain    Behavior During Therapy [facility] for tasks assessed/performed           Past Medical History:  Diagnosis Date   Arthritis    Chronic systolic CHF (congestive heart failure) (HCC) 05/2015   COPD (chronic obstructive pulmonary disease) (HCC)    Coronary artery disease    mild CAD in the mRCA 25-49% 10/302024 CCTA   Hypercholesterolemia    Hypertension    Hypothyroidism    Interatrial septal aneurysm with PFO    04/11/2023 CCTA: aneurysmal atrial septum bowing rightwards with PFO with left to right shunt   Meningioma, cerebral (HCC)    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   Multiple thyroid  nodules    Osteoporosis    Past Surgical History:  Procedure Laterality Date   APPLICATION OF CRANIAL NAVIGATION Left 10/10/2022   Procedure: APPLICATION OF CRANIAL NAVIGATION;  Surgeon: Carollee Lani BROCKS, DO;  [facility];  Service: Neurosurgery;  Laterality: Left;   CESAREAN SECTION  x 2   CRANIOTOMY Left 10/10/2022   Procedure: LEFT CRANIOTOMY TUMOR EXCISION;  Surgeon: Carollee Lani BROCKS, DO;  Location: [facility];  Service: Neurosurgery;  Laterality: Left;   HIP PINNING,CANNULATED Right 05/25/2015   Procedure: CANNULATED HIP PINNING;  Surgeon: Norleen Gavel, MD;  Location: [facility];  Service: Orthopedics;  Laterality: Right;   RADIOLOGY WITH ANESTHESIA N/A 10/08/2022    Procedure: MRI WITH ANESTHESIA;  Surgeon: Radiologist, Medication, MD;  [facility]: [facility];  Service: Radiology;  Laterality: N/A;   RADIOLOGY WITH ANESTHESIA N/A 04/28/2024   Procedure: MRI WITH ANESTHESIA;  Surgeon: Radiologist, Medication, MD;  [facility];  Service: Radiology;  Laterality: N/A;  MRI OF BRAIN WITH AND WITHOUT CONTRAST   TONSILLECTOMY     Patient Active Problem List   Diagnosis Date Noted   Age-related osteoporosis without current pathological fracture 01/15/2024   Vaginal atrophy 12/11/2023   Encounter for well woman exam with routine gynecological exam 12/11/2023   Cardiomyopathy (HCC) 10/19/2022   LVH (left ventricular hypertrophy) 10/19/2022   Seizure (HCC) 10/05/2022   Legionella pneumonia (HCC) 02/17/2020   Sepsis due to pneumonia (HCC) 02/09/2020   Acute respiratory failure with hypoxia due to PNA/COPD 02/09/2020   Chronic HFrEF (heart failure with reduced ejection fraction) (HCC) 02/09/2020   CHF (congestive heart failure) (HCC)    Chronic systolic heart failure (HCC) 05/27/2015   Hip fracture, right (HCC) 05/24/2015   Hip fracture (HCC) 05/24/2015   TOBACCO ABUSE 07/05/2010   Essential hypertension 07/05/2010   GOITER, MULTINODULAR 06/21/2010   HYPERLIPIDEMIA 06/21/2010   Palpitations 06/21/2010   CHEST DISCOMFORT 06/21/2010    ONSET DATE: 04/01/24  REFERRING DIAG:  Diagnosis  S81.811D (ICD-10-CM) - Laceration without foreign body, right lower leg,  subsequent encounter    THERAPY DIAG:  Pain in right lower leg  Localized edema  Open leg wound, right, subsequent encounter  Rationale for Evaluation and Treatment: Rehabilitation   05/20/24 0001  Subjective Assessment  Subjective PT states that the new dressing was much more comfortable for her.  Patient and Family Stated Goals less pain and for the wound to heal  Date of Onset 04/01/24  Prior Treatments ER, urgent care and self care  Pain Assessment  Pain Scale 0-10  Pain Score 2   Pain Type Acute pain  Pain Location Leg  Pain Intervention(s) Emotional support  Evaluation and Treatment  Evaluation and Treatment Procedures Explained to Patient/Family Yes  Evaluation and Treatment Procedures agreed to  Wound 04/24/24 0940 Traumatic Pretibial Right  Date First Assessed/Time First Assessed: 04/24/24 0940   Present on Original Admission: Yes  Primary Wound Type: Traumatic  Secondary Wound Type - Traumatic: Laceration  Location: Pretibial  Location Orientation: Right  Site / Wound Assessment Yellow;Pink  Peri-wound Assessment Edema  Drainage Description Serous  Drainage Amount Small  Treatments Cleansed;Site care;Irrigation;Moisturizing cream;Other (Comment) (wound covered with biofilm.  Therapist debrided biofilm with forceps. Wound has noted superior tunnel going at least .3 cm.)  Dressing Type Gauze (Comment)  Dressing Changed Changed  Dressing Status New drainage;Old drainage  Wound Therapy - Assess/Plan/Recommendations  Wound Therapy - Clinical Statement see below  Wound Therapy - Functional Problem List see below  Factors Delaying/Impairing Wound Healing Infection - systemic/local;Vascular compromise  Hydrotherapy Plan Debridement;Dressing change;Patient/family education;Pulsatile lavage with suction  Wound Therapy - Frequency 2X / week  Wound Therapy - Current Recommendations PT  Wound Plan debridement, manual and dressing change  Wound Therapy  Dressing  xeroform, cotton at ankle,, 4x4, kerlix  followed by coban  Manual Therapy decongestive techniques with lotion to decrease edema.        PATIENT EDUCATION: Education details: Keep dressing on unless it becomes painful.  Keep dressing dry Person educated: Patient and Child(ren) Education method: Explanation Education comprehension: verbalized understanding   HOME EXERCISE PROGRAM: none   GOALS: Goals reviewed with patient? Yes  SHORT TERM GOALS: Target date: 05/15/24  Pt pain to be no greater  than a 4/10 Baseline: Goal status: MET  2.  Pt wound to be 80% granulated Baseline:  Goal status: MET following debridement.    LONG TERM GOALS: Target date: 06/11/24- extended due to holidays where clinic will be closed.   Pt pain to be no greater than a 2/10 Baseline:  Goal status: IN PROGRESS  2.  Pt wound to be 100% granulated to allow pt to be comfortable with self care.  Baseline:  Goal status: IN PROGRESS  3.  Wound to be no greater than 2x2 cm to allow pt to be comfortable with self care.  Baseline:  Goal status: IN PROGRESS   ASSESSMENT:  CLINICAL IMPRESSION:    Selective debridement for removal of slough from wound bed  to promote healing.  Following debridement wound is 90% granulated.  Wound depth is improving.  Wound continues undermines superiorly 0.3 cm.  Due to less drainage dressing changed to xeroform.  PT pain level notably decreased.  Pt will continue to benefit from skilled PT to ensure a healing environment is maintained to assist in the prevention of cellulitis.  OBJECTIVE IMPAIRMENTS: difficulty walking, increased edema, pain, and decreased skin integrity.   ACTIVITY LIMITATIONS: bathing, dressing, hygiene/grooming, and locomotion level  PARTICIPATION LIMITATIONS: cleaning, shopping, and community activity  PERSONAL FACTORS: Time  since onset of injury/illness/exacerbation and 1 comorbidity: infection  are also affecting patient's functional outcome.   REHAB POTENTIAL: Good  CLINICAL DECISION MAKING: Evolving/moderate complexity  EVALUATION COMPLEXITY: Moderate  PLAN: PT FREQUENCY: 2x/week   PT DURATION: 6 weeks  PLANNED INTERVENTIONS: 97535- Self Care, 02859- Manual therapy, 97597- Wound care (first 20 sq cm), 97598- Wound care (each additional 20 sq cm), and Patient/Family education  PLAN FOR NEXT SESSION:  Continue to complete debridement/manual and compression  to ensure a healing environment is maintained.   Montie Metro, PT  CLT  [phone]  05/20/2024, 9:18 AM

## 2024-05-22 ENCOUNTER — Ambulatory Visit (HOSPITAL_COMMUNITY): Admitting: Physical Therapy

## 2024-05-22 DIAGNOSIS — M79661 Pain in right lower leg: Secondary | ICD-10-CM | POA: Diagnosis not present

## 2024-05-22 DIAGNOSIS — S81801D Unspecified open wound, right lower leg, subsequent encounter: Secondary | ICD-10-CM

## 2024-05-22 DIAGNOSIS — R600 Localized edema: Secondary | ICD-10-CM

## 2024-05-22 NOTE — Therapy (Signed)
 OUTPATIENT PHYSICAL THERAPY Wound Treatment   Patient Name: Penny Hicks MRN: [id number] DOB:12-Aug-1950, 73 y.o., female Today's Date: 05/22/2024   PCP: Norleen General  REFERRING PROVIDER: Rosette Russian  END OF SESSION:  PT End of Session - 05/22/24 0903     Visit Number 8    Number of Visits 12    Date for Recertification  06/11/24    Authorization Type no auth required    Authorization - Visit Number 8    Authorization - Number of Visits 12    Progress Note Due on Visit 10    PT Start Time 0814    PT Stop Time 0844    PT Time Calculation (min) 30 min    Activity Tolerance Patient limited by pain    Behavior During Therapy [facility] for tasks assessed/performed           Past Medical History:  Diagnosis Date   Arthritis    Chronic systolic CHF (congestive heart failure) (HCC) 05/2015   COPD (chronic obstructive pulmonary disease) (HCC)    Coronary artery disease    mild CAD in the mRCA 25-49% 10/302024 CCTA   Hypercholesterolemia    Hypertension    Hypothyroidism    Interatrial septal aneurysm with PFO    04/11/2023 CCTA: aneurysmal atrial septum bowing rightwards with PFO with left to right shunt   Meningioma, cerebral (HCC)    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   Multiple thyroid  nodules    Osteoporosis    Past Surgical History:  Procedure Laterality Date   APPLICATION OF CRANIAL NAVIGATION Left 10/10/2022   Procedure: APPLICATION OF CRANIAL NAVIGATION;  Surgeon: Carollee Lani BROCKS, DO;  [facility];  Service: Neurosurgery;  Laterality: Left;   CESAREAN SECTION  x 2   CRANIOTOMY Left 10/10/2022   Procedure: LEFT CRANIOTOMY TUMOR EXCISION;  Surgeon: Carollee Lani BROCKS, DO;  [facility];  Service: Neurosurgery;  Laterality: Left;   HIP PINNING,CANNULATED Right 05/25/2015   Procedure: CANNULATED HIP PINNING;  Surgeon: Norleen Gavel, MD;  [facility];  Service: Orthopedics;  Laterality: Right;   RADIOLOGY WITH ANESTHESIA N/A 10/08/2022    Procedure: MRI WITH ANESTHESIA;  Surgeon: Radiologist, Medication, MD;  [facility]: [facility];  Service: Radiology;  Laterality: N/A;   RADIOLOGY WITH ANESTHESIA N/A 04/28/2024   Procedure: MRI WITH ANESTHESIA;  Surgeon: Radiologist, Medication, MD;  [facility];  Service: Radiology;  Laterality: N/A;  MRI OF BRAIN WITH AND WITHOUT CONTRAST   TONSILLECTOMY     Patient Active Problem List   Diagnosis Date Noted   Age-related osteoporosis without current pathological fracture 01/15/2024   Vaginal atrophy 12/11/2023   Encounter for well woman exam with routine gynecological exam 12/11/2023   Cardiomyopathy (HCC) 10/19/2022   LVH (left ventricular hypertrophy) 10/19/2022   Seizure (HCC) 10/05/2022   Legionella pneumonia (HCC) 02/17/2020   Sepsis due to pneumonia (HCC) 02/09/2020   Acute respiratory failure with hypoxia due to PNA/COPD 02/09/2020   Chronic HFrEF (heart failure with reduced ejection fraction) (HCC) 02/09/2020   CHF (congestive heart failure) (HCC)    Chronic systolic heart failure (HCC) 05/27/2015   Hip fracture, right (HCC) 05/24/2015   Hip fracture (HCC) 05/24/2015   TOBACCO ABUSE 07/05/2010   Essential hypertension 07/05/2010   GOITER, MULTINODULAR 06/21/2010   HYPERLIPIDEMIA 06/21/2010   Palpitations 06/21/2010   CHEST DISCOMFORT 06/21/2010    ONSET DATE: 04/01/24  REFERRING DIAG:  Diagnosis  S81.811D (ICD-10-CM) - Laceration without foreign body, right lower leg,  subsequent encounter    THERAPY DIAG:  Pain in right lower leg  Localized edema  Open leg wound, right, subsequent encounter  Rationale for Evaluation and Treatment: Rehabilitation    05/22/24 0001  Subjective Assessment  Subjective Pt states that she now has the knee hi compression garments.  Patient and Family Stated Goals less pain and for the wound to heal  Date of Onset 04/01/24  Prior Treatments ER, urgent care and self care  Pain Assessment  Pain Scale 0-10  Pain Score 2  Pain  Type Acute pain  Pain Location Leg  Pain Intervention(s) Distraction  Evaluation and Treatment  Evaluation and Treatment Procedures Explained to Patient/Family Yes  Evaluation and Treatment Procedures agreed to  Wound 04/24/24 0940 Traumatic Pretibial Right  Date First Assessed/Time First Assessed: 04/24/24 0940   Present on Original Admission: Yes  Primary Wound Type: Traumatic  Secondary Wound Type - Traumatic: Laceration  Location: Pretibial  Location Orientation: Right  Site / Wound Assessment Yellow;Pink  Peri-wound Assessment Edema  Wound Length (cm) 2 cm  Wound Width (cm) 2.2 cm  Wound Surface Area (cm^2) 3.46 cm^2  Wound Depth (cm) 0.1 cm  Wound Volume (cm^3) 0.23 cm^3  Drainage Description Serous  Drainage Amount Small  Treatments Cleansed;Site care;Other (Comment) (debridement of wound, instructions on how to don compression garment.)  Dressing Type Gauze (Comment)  Dressing Changed New  Dressing Status New drainage;Old drainage  Wound Therapy - Assess/Plan/Recommendations  Wound Therapy - Clinical Statement see below  Wound Therapy - Functional Problem List see below  Factors Delaying/Impairing Wound Healing Infection - systemic/local;Vascular compromise  Hydrotherapy Plan Debridement;Dressing change;Patient/family education;Pulsatile lavage with suction  Wound Therapy - Frequency 2X / week  Wound Therapy - Current Recommendations PT  Wound Plan debridement, manual and dressing change  Wound Therapy  Dressing  saline wet to moist 2x2 with packing into superior tunnel, vaseline over 2x2 to keep moist followed by tegaderm and donning of compression garment.        PATIENT EDUCATION: 05/22/24:  How to don compression garment.  Education details: Keep dressing on unless it becomes painful.  Keep dressing dry Person educated: Patient and Child(ren) Education method: Explanation Education comprehension: verbalized understanding   HOME EXERCISE  PROGRAM: none   GOALS: Goals reviewed with patient? Yes  SHORT TERM GOALS: Target date: 05/15/24  Pt pain to be no greater than a 4/10 Baseline: Goal status: MET  2.  Pt wound to be 80% granulated Baseline:  Goal status: MET following debridement.    LONG TERM GOALS: Target date: 06/11/24- extended due to holidays where clinic will be closed.   Pt pain to be no greater than a 2/10 Baseline:  Goal status: MET  2.  Pt wound to be 100% granulated to allow pt to be comfortable with self care.  Baseline:  Goal status: IN PROGRESS  3.  Wound to be no greater than 2x2 cm to allow pt to be comfortable with self care.  Baseline:  Goal status: IN PROGRESS   ASSESSMENT:  CLINICAL IMPRESSION:    Selective debridement for removal of slough from wound bed  to promote healing.  Following debridement wound is 95% granulated.  Wound depth continues to improver  improving, however, wound continues to  undermine superiorly 0.3 cm.  Following dressing pt demonstrated the ability to don compression garment.   Pt will continue to benefit from skilled PT to ensure a healing environment is maintained to assist in the prevention of cellulitis.  OBJECTIVE  IMPAIRMENTS: difficulty walking, increased edema, pain, and decreased skin integrity.   ACTIVITY LIMITATIONS: bathing, dressing, hygiene/grooming, and locomotion level  PARTICIPATION LIMITATIONS: cleaning, shopping, and community activity  PERSONAL FACTORS: Time since onset of injury/illness/exacerbation and 1 comorbidity: infection  are also affecting patient's functional outcome.   REHAB POTENTIAL: Good  CLINICAL DECISION MAKING: Evolving/moderate complexity  EVALUATION COMPLEXITY: Moderate  PLAN: PT FREQUENCY: 2x/week 05/22/24:  Pt is now decreased to once a week as wound is approximating and improving in granulation.    PT DURATION: 6 weeks  PLANNED INTERVENTIONS: 97535- Self Care, 02859- Manual therapy, 97597- Wound care (first 20  sq cm), 97598- Wound care (each additional 20 sq cm), and Patient/Family education  PLAN FOR NEXT SESSION:  Continue to complete debridement/manual and compression  to ensure a healing environment is maintained.   Montie Metro, PT CLT  [phone]  05/22/2024, 9:07 AM

## 2024-05-27 ENCOUNTER — Ambulatory Visit (HOSPITAL_COMMUNITY): Admitting: Physical Therapy

## 2024-05-27 DIAGNOSIS — S81801D Unspecified open wound, right lower leg, subsequent encounter: Secondary | ICD-10-CM

## 2024-05-27 DIAGNOSIS — M79661 Pain in right lower leg: Secondary | ICD-10-CM | POA: Diagnosis not present

## 2024-05-27 DIAGNOSIS — R600 Localized edema: Secondary | ICD-10-CM

## 2024-05-27 NOTE — Therapy (Signed)
 OUTPATIENT PHYSICAL THERAPY Wound Treatment   Patient Name: Penny Hicks MRN: [id number] DOB:March 14, 1951, 73 y.o., female Today's Date: 05/27/2024   PCP: Norleen General  REFERRING PROVIDER: Rosette Russian  END OF SESSION:  PT End of Session - 05/27/24 1058     Visit Number 9    Number of Visits 12    Date for Recertification  06/11/24    Authorization Type no auth required    Authorization - Visit Number 9    Authorization - Number of Visits 12    Progress Note Due on Visit 10    PT Start Time 1011    PT Stop Time 1045    PT Time Calculation (min) 34 min    Activity Tolerance Patient limited by pain    Behavior During Therapy [facility] for tasks assessed/performed           Past Medical History:  Diagnosis Date   Arthritis    Chronic systolic CHF (congestive heart failure) (HCC) 05/2015   COPD (chronic obstructive pulmonary disease) (HCC)    Coronary artery disease    mild CAD in the mRCA 25-49% 10/302024 CCTA   Hypercholesterolemia    Hypertension    Hypothyroidism    Interatrial septal aneurysm with PFO    04/11/2023 CCTA: aneurysmal atrial septum bowing rightwards with PFO with left to right shunt   Meningioma, cerebral (HCC)    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   Multiple thyroid  nodules    Osteoporosis    Past Surgical History:  Procedure Laterality Date   APPLICATION OF CRANIAL NAVIGATION Left 10/10/2022   Procedure: APPLICATION OF CRANIAL NAVIGATION;  Surgeon: Carollee Lani BROCKS, DO;  [facility];  Service: Neurosurgery;  Laterality: Left;   CESAREAN SECTION  x 2   CRANIOTOMY Left 10/10/2022   Procedure: LEFT CRANIOTOMY TUMOR EXCISION;  Surgeon: Carollee Lani BROCKS, DO;  [facility];  Service: Neurosurgery;  Laterality: Left;   HIP PINNING,CANNULATED Right 05/25/2015   Procedure: CANNULATED HIP PINNING;  Surgeon: Norleen Gavel, MD;  [facility]: [facility];  Service: Orthopedics;  Laterality: Right;   RADIOLOGY WITH ANESTHESIA N/A 10/08/2022    Procedure: MRI WITH ANESTHESIA;  Surgeon: Radiologist, Medication, MD;  [facility];  Service: Radiology;  Laterality: N/A;   RADIOLOGY WITH ANESTHESIA N/A 04/28/2024   Procedure: MRI WITH ANESTHESIA;  Surgeon: Radiologist, Medication, MD;  [facility];  Service: Radiology;  Laterality: N/A;  MRI OF BRAIN WITH AND WITHOUT CONTRAST   TONSILLECTOMY     Patient Active Problem List   Diagnosis Date Noted   Age-related osteoporosis without current pathological fracture 01/15/2024   Vaginal atrophy 12/11/2023   Encounter for well woman exam with routine gynecological exam 12/11/2023   Cardiomyopathy (HCC) 10/19/2022   LVH (left ventricular hypertrophy) 10/19/2022   Seizure (HCC) 10/05/2022   Legionella pneumonia (HCC) 02/17/2020   Sepsis due to pneumonia (HCC) 02/09/2020   Acute respiratory failure with hypoxia due to PNA/COPD 02/09/2020   Chronic HFrEF (heart failure with reduced ejection fraction) (HCC) 02/09/2020   CHF (congestive heart failure) (HCC)    Chronic systolic heart failure (HCC) 05/27/2015   Hip fracture, right (HCC) 05/24/2015   Hip fracture (HCC) 05/24/2015   TOBACCO ABUSE 07/05/2010   Essential hypertension 07/05/2010   GOITER, MULTINODULAR 06/21/2010   HYPERLIPIDEMIA 06/21/2010   Palpitations 06/21/2010   CHEST DISCOMFORT 06/21/2010    ONSET DATE: 04/01/24  REFERRING DIAG:  Diagnosis  S81.811D (ICD-10-CM) - Laceration without foreign body, right lower leg,  subsequent encounter    THERAPY DIAG:  Pain in right lower leg  Localized edema  Open leg wound, right, subsequent encounter  Rationale for Evaluation and Treatment: Rehabilitation    05/22/24 0001  Subjective Assessment  Subjective Pt states that she now has the knee hi compression garments.  Patient and Family Stated Goals less pain and for the wound to heal  Date of Onset 04/01/24  Prior Treatments ER, urgent care and self care  Pain Assessment  Pain Scale 0-10  Pain Score 2  Pain  Type Acute pain  Pain Location Leg  Pain Intervention(s) Distraction  Evaluation and Treatment  Evaluation and Treatment Procedures Explained to Patient/Family Yes  Evaluation and Treatment Procedures agreed to  Wound 04/24/24 0940 Traumatic Pretibial Right  Date First Assessed/Time First Assessed: 04/24/24 0940   Present on Original Admission: Yes  Primary Wound Type: Traumatic  Secondary Wound Type - Traumatic: Laceration  Location: Pretibial  Location Orientation: Right  Site / Wound Assessment Yellow;Pink  Peri-wound Assessment Edema  Wound Length (cm) 2 cm  Wound Width (cm) 2.2 cm  Wound Surface Area (cm^2) 3.46 cm^2  Wound Depth (cm) 0.1 cm  Wound Volume (cm^3) 0.23 cm^3  Drainage Description Serous  Drainage Amount Small  Treatments Cleansed;Site care;Other (Comment) (debridement of wound, instructions on how to don compression garment.)  Dressing Type Gauze (Comment)  Dressing Changed New  Dressing Status New drainage;Old drainage  Wound Therapy - Assess/Plan/Recommendations  Wound Therapy - Clinical Statement see below  Wound Therapy - Functional Problem List see below  Factors Delaying/Impairing Wound Healing Infection - systemic/local;Vascular compromise  Hydrotherapy Plan Debridement;Dressing change;Patient/family education;Pulsatile lavage with suction  Wound Therapy - Frequency 2X / week  Wound Therapy - Current Recommendations PT  Wound Plan debridement, manual and dressing change  Wound Therapy  Dressing  saline wet to moist 2x2 with packing into superior tunnel, vaseline over 2x2 to keep moist followed by tegaderm and donning of compression garment.        PATIENT EDUCATION: 05/22/24:  How to don compression garment.  Education details: Keep dressing on unless it becomes painful.  Keep dressing dry Person educated: Patient and Child(ren) Education method: Explanation Education comprehension: verbalized understanding   HOME EXERCISE  PROGRAM: none   GOALS: Goals reviewed with patient? Yes  SHORT TERM GOALS: Target date: 05/15/24  Pt pain to be no greater than a 4/10 Baseline: Goal status: MET  2.  Pt wound to be 80% granulated Baseline:  Goal status: MET following debridement.    LONG TERM GOALS: Target date: 06/11/24- extended due to holidays where clinic will be closed.   Pt pain to be no greater than a 2/10 Baseline:  Goal status: MET  2.  Pt wound to be 100% granulated to allow pt to be comfortable with self care.  Baseline:  Goal status: IN PROGRESS  3.  Wound to be no greater than 2x2 cm to allow pt to be comfortable with self care.  Baseline:  Goal status: IN PROGRESS   ASSESSMENT:  CLINICAL IMPRESSION:    PT appears to be allergic to adhesive on Band-Aid.  Therapist recommended keeping current dressing on until Thursday.  If when pt removes redness is decreased attempt to find a hypo-allergenic Band-Aid brand.  Therapist advised if pain increased or pt began to have a fever to contact MD>    Pt will continue to benefit from skilled PT to ensure a healing environment is maintained to assist in the prevention of cellulitis.  OBJECTIVE IMPAIRMENTS: difficulty walking, increased edema, pain, and decreased skin integrity.   ACTIVITY LIMITATIONS: bathing, dressing, hygiene/grooming, and locomotion level  PARTICIPATION LIMITATIONS: cleaning, shopping, and community activity  PERSONAL FACTORS: Time since onset of injury/illness/exacerbation and 1 comorbidity: infection  are also affecting patient's functional outcome.   REHAB POTENTIAL: Good  CLINICAL DECISION MAKING: Evolving/moderate complexity  EVALUATION COMPLEXITY: Moderate  PLAN: PT FREQUENCY: 2x/week 05/22/24:  Pt is now decreased to once a week as wound is approximating and improving in granulation.    PT DURATION: 6 weeks  PLANNED INTERVENTIONS: 97535- Self Care, 02859- Manual therapy, 97597- Wound care (first 20 sq cm), 97598-  Wound care (each additional 20 sq cm), and Patient/Family education  PLAN FOR NEXT SESSION:  Continue to complete debridement/manual and compression  to ensure a healing environment is maintained.   Montie Metro, PT CLT  [phone]  05/27/2024, 11:04 AM

## 2024-06-02 ENCOUNTER — Ambulatory Visit (HOSPITAL_COMMUNITY): Admitting: Physical Therapy

## 2024-06-02 DIAGNOSIS — S81801D Unspecified open wound, right lower leg, subsequent encounter: Secondary | ICD-10-CM

## 2024-06-02 DIAGNOSIS — R600 Localized edema: Secondary | ICD-10-CM

## 2024-06-02 DIAGNOSIS — M79661 Pain in right lower leg: Secondary | ICD-10-CM | POA: Diagnosis not present

## 2024-06-02 NOTE — Progress Notes (Signed)
" °   06/02/24 0001  Subjective Assessment  Subjective Pt states that her shin bone has been hurting and she feels that her leg has some heat to it.  Patient and Family Stated Goals less pain and for the wound to heal  Date of Onset 04/01/24  Prior Treatments ER, urgent care and self care  Pain Assessment  Pain Scale 0-10  Pain Score  (none just itches.)  Evaluation and Treatment  Evaluation and Treatment Procedures Explained to Patient/Family Yes  Evaluation and Treatment Procedures agreed to  Wound 04/24/24 0940 Traumatic Pretibial Right  Date First Assessed/Time First Assessed: 04/24/24 0940   Present on Original Admission: Yes  Primary Wound Type: Traumatic  Secondary Wound Type - Traumatic: Laceration  Location: Pretibial  Location Orientation: Right  Wound Image   Site / Wound Assessment Yellow;Pink  Peri-wound Assessment Edema  Wound Length (cm) 1.8 cm (was 4.5)  Wound Width (cm) 1.2 cm (was 5 cm)  Wound Surface Area (cm^2) 1.7 cm^2  Wound Depth (cm)  (tunnels superiorly .15 cm now was unknown depth or tunneling at evlauation due to 100% slough.)  Drainage Description Serous (wound is now 95% granulated was 0% at evaluation)  Drainage Amount Scant  Treatments Cleansed;Site care;Other (Comment);Moisturizing cream (debridement.)  Dressing Type Other (Comment);None (bandaid)  Dressing Changed New  Dressing Status Dry;Clean  Wound Therapy - Assess/Plan/Recommendations  Wound Therapy - Clinical Statement see below  Wound Therapy - Functional Problem List see below  Factors Delaying/Impairing Wound Healing Infection - systemic/local;Vascular compromise  Hydrotherapy Plan Debridement;Dressing change;Patient/family education;Pulsatile lavage with suction  Wound Therapy - Frequency 2X / week  Wound Therapy - Current Recommendations PT  Wound Plan debridement, manual and dressing change  Wound Therapy  Dressing  hydrogel followed by 2x2, 3 kling and netting to hold dressing in  place.    "

## 2024-06-02 NOTE — Therapy (Addendum)
 " OUTPATIENT PHYSICAL THERAPY Wound Treatment/progress   Patient Name: Penny Hicks MRN: [id number] DOB:March 24, 1951, 73 y.o., female Today's Date: 06/02/2024   PCP: Norleen General  REFERRING PROVIDER: Rosette Russian  Progress Note Reporting Period 04/24/24 to 06/02/24  See note below for Objective Data and Assessment of Progress/Goals.     END OF SESSION:  PT End of Session - 06/02/24 0851     Visit Number 10    Number of Visits 12    Date for Recertification  06/11/24    Authorization Type no auth required    Authorization - Visit Number 10    Authorization - Number of Visits 12    Progress Note Due on Visit 10    PT Start Time 0820    PT Stop Time 0848    PT Time Calculation (min) 28 min    Activity Tolerance Patient limited by pain    Behavior During Therapy [facility] for tasks assessed/performed           Past Medical History:  Diagnosis Date   Arthritis    Chronic systolic CHF (congestive heart failure) (HCC) 05/2015   COPD (chronic obstructive pulmonary disease) (HCC)    Coronary artery disease    mild CAD in the mRCA 25-49% 10/302024 CCTA   Hypercholesterolemia    Hypertension    Hypothyroidism    Interatrial septal aneurysm with PFO    04/11/2023 CCTA: aneurysmal atrial septum bowing rightwards with PFO with left to right shunt   Meningioma, cerebral (HCC)    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   Multiple thyroid  nodules    Osteoporosis    Past Surgical History:  Procedure Laterality Date   APPLICATION OF CRANIAL NAVIGATION Left 10/10/2022   Procedure: APPLICATION OF CRANIAL NAVIGATION;  Surgeon: Carollee Lani BROCKS, DO;  Location: [facility] OR;  Service: Neurosurgery;  Laterality: Left;   CESAREAN SECTION  x 2   CRANIOTOMY Left 10/10/2022   Procedure: LEFT CRANIOTOMY TUMOR EXCISION;  Surgeon: Carollee Lani BROCKS, DO;  Location: [facility] OR;  Service: Neurosurgery;  Laterality: Left;   HIP PINNING,CANNULATED Right 05/25/2015   Procedure: CANNULATED  HIP PINNING;  Surgeon: Norleen Gavel, MD;  Location: [facility] OR;  Service: Orthopedics;  Laterality: Right;   RADIOLOGY WITH ANESTHESIA N/A 10/08/2022   Procedure: MRI WITH ANESTHESIA;  Surgeon: Radiologist, Medication, MD;  Location: [facility] OR;  Service: Radiology;  Laterality: N/A;   RADIOLOGY WITH ANESTHESIA N/A 04/28/2024   Procedure: MRI WITH ANESTHESIA;  Surgeon: Radiologist, Medication, MD;  Location: [facility] OR;  Service: Radiology;  Laterality: N/A;  MRI OF BRAIN WITH AND WITHOUT CONTRAST   TONSILLECTOMY     Patient Active Problem List   Diagnosis Date Noted   Age-related osteoporosis without current pathological fracture 01/15/2024   Vaginal atrophy 12/11/2023   Encounter for well woman exam with routine gynecological exam 12/11/2023   Cardiomyopathy (HCC) 10/19/2022   LVH (left ventricular hypertrophy) 10/19/2022   Seizure (HCC) 10/05/2022   Legionella pneumonia (HCC) 02/17/2020   Sepsis due to pneumonia (HCC) 02/09/2020   Acute respiratory failure with hypoxia due to PNA/COPD 02/09/2020   Chronic HFrEF (heart failure with reduced ejection fraction) (HCC) 02/09/2020   CHF (congestive heart failure) (HCC)    Chronic systolic heart failure (HCC) 05/27/2015   Hip fracture, right (HCC) 05/24/2015   Hip fracture (HCC) 05/24/2015   TOBACCO ABUSE 07/05/2010   Essential hypertension 07/05/2010   GOITER, MULTINODULAR 06/21/2010   HYPERLIPIDEMIA 06/21/2010   Palpitations 06/21/2010   CHEST DISCOMFORT  06/21/2010    ONSET DATE: 04/01/24  REFERRING DIAG:  Diagnosis  S81.811D (ICD-10-CM) - Laceration without foreign body, right lower leg, subsequent encounter    THERAPY DIAG:  Pain in right lower leg  Localized edema  Open leg wound, right, subsequent encounter  Rationale for Evaluation and Treatment: Rehabilitation    06/02/24 0001  Subjective Assessment  Subjective Pt states that her shin bone has been hurting and she feels that her leg has some heat to it.  Patient and Family Stated  Goals less pain and for the wound to heal  Date of Onset 04/01/24  Prior Treatments ER, urgent care and self care  Pain Assessment  Pain Scale 0-10  Pain Score  (none just itches.)  Evaluation and Treatment  Evaluation and Treatment Procedures Explained to Patient/Family Yes  Evaluation and Treatment Procedures agreed to  Wound 04/24/24 0940 Traumatic Pretibial Right  Date First Assessed/Time First Assessed: 04/24/24 0940   Present on Original Admission: Yes  Primary Wound Type: Traumatic  Secondary Wound Type - Traumatic: Laceration  Location: Pretibial  Location Orientation: Right  Wound Image   Site / Wound Assessment Yellow;Pink  Peri-wound Assessment Edema  Wound Length (cm) 1.8 cm (was 4.5)  Wound Width (cm) 1.2 cm (was 5 cm)  Wound Surface Area (cm^2) 1.7 cm^2  Wound Depth (cm)  (tunnels superiorly .15 cm now was unknown depth or tunneling at evlauation due to 100% slough.)  Drainage Description Serous (wound is now 95% granulated was 0% at evaluation)  Drainage Amount Scant  Treatments Cleansed;Site care;Other (Comment);Moisturizing cream (debridement.)  Dressing Type Other (Comment);None (bandaid)  Dressing Changed New  Dressing Status Dry;Clean  Wound Therapy - Assess/Plan/Recommendations  Wound Therapy - Clinical Statement see below  Wound Therapy - Functional Problem List see below  Factors Delaying/Impairing Wound Healing Infection - systemic/local;Vascular compromise  Hydrotherapy Plan Debridement;Dressing change;Patient/family education;Pulsatile lavage with suction  Wound Therapy - Frequency 2X / week  Wound Therapy - Current Recommendations PT  Wound Plan debridement, manual and dressing change  Wound Therapy  Dressing  hydrogel followed by 2x2, 3 kling and netting to hold dressing in place.      PATIENT EDUCATION: 05/22/24:  How to don compression garment.  Education details: Keep dressing on unless it becomes painful.  Keep dressing dry Person  educated: Patient and Child(ren) Education method: Explanation Education comprehension: verbalized understanding   HOME EXERCISE PROGRAM: none   GOALS: Goals reviewed with patient? Yes  SHORT TERM GOALS: Target date: 05/15/24  Pt pain to be no greater than a 4/10 Baseline: Goal status: MET  2.  Pt wound to be 80% granulated Baseline:  Goal status: MET following debridement.    LONG TERM GOALS: Target date: 06/11/24- extended due to holidays where clinic will be closed.   Pt pain to be no greater than a 2/10 Baseline:  Goal status: MET  2.  Pt wound to be 100% granulated to allow pt to be comfortable with self care.  Baseline:  Goal status: IN PROGRESS  3.  Wound to be no greater than 2x2 cm to allow pt to be comfortable with self care.  Baseline:  Goal status: MET   ASSESSMENT:  CLINICAL IMPRESSION:   Pt continues to have some redness around wound but appears to be more of a reaction than infection.  Advised to get hydrocortisone over the counter and see if she applies the cream if the redness decreases.  Pt wound has scant drainage, is approximating and has less slough  which does makes therapist think that the redness of the leg is not due to infection but rather a reaction therefore changed to hydrogel with dressing.     Pt will continue to benefit from skilled PT to ensure a healing environment is maintained to assist in the prevention of cellulitis.    OBJECTIVE IMPAIRMENTS: difficulty walking, increased edema, pain, and decreased skin integrity.   ACTIVITY LIMITATIONS: bathing, dressing, hygiene/grooming, and locomotion level  PARTICIPATION LIMITATIONS: cleaning, shopping, and community activity  PERSONAL FACTORS: Time since onset of injury/illness/exacerbation and 1 comorbidity: infection  are also affecting patient's functional outcome.   REHAB POTENTIAL: Good  CLINICAL DECISION MAKING: Evolving/moderate complexity  EVALUATION COMPLEXITY:  Moderate  PLAN: PT FREQUENCY: 2x/week 05/22/24:  Pt is now decreased to once a week as wound is approximating and improving in granulation.    PT DURATION: 6 weeks  PLANNED INTERVENTIONS: 97535- Self Care, 02859- Manual therapy, 97597- Wound care (first 20 sq cm), 97598- Wound care (each additional 20 sq cm), and Patient/Family education  PLAN FOR NEXT SESSION:  Continue to complete debridement/manual  to ensure a healing environment is maintained.   Montie Metro, PT CLT  [phone]  06/02/2024, 8:52 AM      "

## 2024-06-09 ENCOUNTER — Ambulatory Visit (HOSPITAL_COMMUNITY): Admitting: Physical Therapy

## 2024-06-09 DIAGNOSIS — S81801D Unspecified open wound, right lower leg, subsequent encounter: Secondary | ICD-10-CM

## 2024-06-09 DIAGNOSIS — M79661 Pain in right lower leg: Secondary | ICD-10-CM

## 2024-06-09 DIAGNOSIS — R600 Localized edema: Secondary | ICD-10-CM

## 2024-06-09 NOTE — Therapy (Signed)
 " OUTPATIENT PHYSICAL THERAPY Wound Treatment/Discharge   Patient Name: Penny Hicks MRN: [id number] DOB:1951/05/28, 73 y.o., female Today's Date: 06/09/2024   PCP: Norleen General  REFERRING PROVIDER: Rosette Russian PHYSICAL THERAPY DISCHARGE SUMMARY  Visits from Start of Care: 11  Current functional level related to goals / functional outcomes: Wound is very small pt feels comfortable with self care at this point.    Remaining deficits: .8x0.3x0.2 cm wound  Education / Equipment: The importance of debriding the slough out of the wound bed.  Use Q-tip if needed.     Patient agrees to discharge. Patient goals were met. Patient is being discharged due to being pleased with the current functional level.    END OF SESSION:  PT End of Session - 06/09/24 0850     Visit Number 11    Number of Visits 11    Date for Recertification  06/11/24    Authorization Type no auth required    Authorization - Number of Visits 11    Progress Note Due on Visit 11    PT Start Time 0810    PT Stop Time 0840    PT Time Calculation (min) 30 min    Activity Tolerance Patient tolerated treatment well    Behavior During Therapy [facility] for tasks assessed/performed            Past Medical History:  Diagnosis Date   Arthritis    Chronic systolic CHF (congestive heart failure) (HCC) 05/2015   COPD (chronic obstructive pulmonary disease) (HCC)    Coronary artery disease    mild CAD in the mRCA 25-49% 10/302024 CCTA   Hypercholesterolemia    Hypertension    Hypothyroidism    Interatrial septal aneurysm with PFO    04/11/2023 CCTA: aneurysmal atrial septum bowing rightwards with PFO with left to right shunt   Meningioma, cerebral (HCC)    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   Multiple thyroid  nodules    Osteoporosis    Past Surgical History:  Procedure Laterality Date   APPLICATION OF CRANIAL NAVIGATION Left 10/10/2022   Procedure: APPLICATION OF CRANIAL  NAVIGATION;  Surgeon: Carollee Lani BROCKS, DO;  Location: [facility] OR;  Service: Neurosurgery;  Laterality: Left;   CESAREAN SECTION  x 2   CRANIOTOMY Left 10/10/2022   Procedure: LEFT CRANIOTOMY TUMOR EXCISION;  Surgeon: Carollee Lani BROCKS, DO;  Location: [facility] OR;  Service: Neurosurgery;  Laterality: Left;   HIP PINNING,CANNULATED Right 05/25/2015   Procedure: CANNULATED HIP PINNING;  Surgeon: Norleen Gavel, MD;  Location: [facility] OR;  Service: Orthopedics;  Laterality: Right;   RADIOLOGY WITH ANESTHESIA N/A 10/08/2022   Procedure: MRI WITH ANESTHESIA;  Surgeon: Radiologist, Medication, MD;  Location: [facility] OR;  Service: Radiology;  Laterality: N/A;   RADIOLOGY WITH ANESTHESIA N/A 04/28/2024   Procedure: MRI WITH ANESTHESIA;  Surgeon: Radiologist, Medication, MD;  Location: [facility] OR;  Service: Radiology;  Laterality: N/A;  MRI OF BRAIN WITH AND WITHOUT CONTRAST   TONSILLECTOMY     Patient Active Problem List   Diagnosis Date Noted   Age-related osteoporosis without current pathological fracture 01/15/2024   Vaginal atrophy 12/11/2023   Encounter for well woman exam with routine gynecological exam 12/11/2023   Cardiomyopathy (HCC) 10/19/2022   LVH (left ventricular hypertrophy) 10/19/2022   Seizure (HCC) 10/05/2022   Legionella pneumonia (HCC) 02/17/2020   Sepsis due to pneumonia (HCC) 02/09/2020   Acute respiratory failure with hypoxia due to PNA/COPD 02/09/2020   Chronic HFrEF (heart failure with reduced  ejection fraction) (HCC) 02/09/2020   CHF (congestive heart failure) (HCC)    Chronic systolic heart failure (HCC) 05/27/2015   Hip fracture, right (HCC) 05/24/2015   Hip fracture (HCC) 05/24/2015   TOBACCO ABUSE 07/05/2010   Essential hypertension 07/05/2010   GOITER, MULTINODULAR 06/21/2010   HYPERLIPIDEMIA 06/21/2010   Palpitations 06/21/2010   CHEST DISCOMFORT 06/21/2010    ONSET DATE: 04/01/24  REFERRING DIAG:  Diagnosis  S81.811D (ICD-10-CM) - Laceration without foreign body, right lower leg,  subsequent encounter    THERAPY DIAG:  Pain in right lower leg  Localized edema  Open leg wound, right, subsequent encounter  Rationale for Evaluation and Treatment: Rehabilitation    06/09/24 0001  Subjective Assessment  Subjective Pt states that she is doing great and can't believe how good her leg looks.  Patient and Family Stated Goals wound to heal  Date of Onset 04/01/24  Prior Treatments ER, urgent care and self care  Pain Assessment  Pain Scale 0-10  Pain Score 0  Wound 04/24/24 0940 Traumatic Pretibial Right  Date First Assessed/Time First Assessed: 04/24/24 0940   Present on Original Admission: Yes  Primary Wound Type: Traumatic  Secondary Wound Type - Traumatic: Laceration  Location: Pretibial  Location Orientation: Right  Wound Image   Site / Wound Assessment Pink;Red;Yellow  Wound Length (cm) 0.8 cm  Wound Width (cm) 0.3 cm  Wound Surface Area (cm^2) 0.19 cm^2  Wound Depth (cm) 0.2 cm  Wound Volume (cm^3) 0.025 cm^3  Drainage Description Serous  Drainage Amount Scant  Treatments Cleansed;Moisturizing cream;Site care (debrided slough from wound bed.)  Dressing Type  (bandaid)  Dressing Changed Changed  Dressing Status Clean  Wound Therapy - Assess/Plan/Recommendations  Wound Therapy - Clinical Statement see below  Wound Therapy - Functional Problem List see below  Factors Delaying/Impairing Wound Healing Infection - systemic/local;Vascular compromise  Hydrotherapy Plan Debridement;Dressing change;Patient/family education;Pulsatile lavage with suction  Wound Therapy - Frequency  (Discharge)  Wound Plan  (Discharge)  Wound Therapy  Dressing  hydrogel followed by 2x2, 3 kling and netting to hold dressing in place.       PATIENT EDUCATION: 05/22/24:  How to don compression garment.  Education details: Keep dressing on unless it becomes painful.  Keep dressing dry Person educated: Patient and Child(ren) Education method: Explanation Education  comprehension: verbalized understanding   HOME EXERCISE PROGRAM: none   GOALS: Goals reviewed with patient? Yes  SHORT TERM GOALS: Target date: 05/15/24  Pt pain to be no greater than a 4/10 Baseline: Goal status: MET  2.  Pt wound to be 80% granulated Baseline:  Goal status: MET following debridement.    LONG TERM GOALS: Target date: 06/11/24- extended due to holidays where clinic will be closed.   Pt pain to be no greater than a 2/10 Baseline:  Goal status: MET  2.  Pt wound to be 100% granulated to allow pt to be comfortable with self care.  Baseline:  Goal status: IN PROGRESS  3.  Wound to be no greater than 2x2 cm to allow pt to be comfortable with self care.  Baseline:  Goal status: MET   ASSESSMENT:  CLINICAL IMPRESSION:   Redness of leg has decreased significantly with hydrocortisone cream.  Noted redness along edge of adhesive of bandaid.  Therapist explained that pt appears to have a sensitivity to the adhesive and recommends staying away from adhesive on the skin.  Pt verbalized understanding.  Pt wound has approximated significantly and pt now feels confident in self  care.  Pt will be discharged from skilled services.   OBJECTIVE IMPAIRMENTS: difficulty walking, increased edema, pain, and decreased skin integrity.   ACTIVITY LIMITATIONS: bathing, dressing, hygiene/grooming, and locomotion level  PARTICIPATION LIMITATIONS: cleaning, shopping, and community activity  PERSONAL FACTORS: Time since onset of injury/illness/exacerbation and 1 comorbidity: infection  are also affecting patient's functional outcome.   REHAB POTENTIAL: Good  CLINICAL DECISION MAKING: Evolving/moderate complexity  EVALUATION COMPLEXITY: Moderate  PLAN: PT FREQUENCY: 2x/week 05/22/24:  Pt is now decreased to once a week as wound is approximating and improving in granulation.    PT DURATION: 6 weeks  PLANNED INTERVENTIONS: 97535- Self Care, 02859- Manual therapy, 97597- Wound  care (first 20 sq cm), 97598- Wound care (each additional 20 sq cm), and Patient/Family education  PLAN FOR NEXT SESSION:  Discharge pt.   Montie Metro, PT CLT  [phone]  06/09/2024, 8:50 AM      "

## 2024-06-17 ENCOUNTER — Ambulatory Visit (HOSPITAL_COMMUNITY): Admitting: Physical Therapy

## 2024-06-24 ENCOUNTER — Ambulatory Visit (HOSPITAL_COMMUNITY): Admitting: Physical Therapy

## 2024-06-28 ENCOUNTER — Other Ambulatory Visit: Payer: Self-pay | Admitting: Nurse Practitioner
